# Patient Record
Sex: Male | Born: 1945 | Race: White | Hispanic: No | Marital: Married | Smoking: Former smoker
Health system: Southern US, Community
[De-identification: ages and names within clinical notes are randomized; demographics above are authoritative.]

## PROBLEM LIST (undated history)

## (undated) DIAGNOSIS — E875 Hyperkalemia: Secondary | ICD-10-CM

## (undated) DIAGNOSIS — E119 Type 2 diabetes mellitus without complications: Secondary | ICD-10-CM

## (undated) DIAGNOSIS — I639 Cerebral infarction, unspecified: Secondary | ICD-10-CM

## (undated) DIAGNOSIS — H9192 Unspecified hearing loss, left ear: Secondary | ICD-10-CM

## (undated) DIAGNOSIS — I255 Ischemic cardiomyopathy: Secondary | ICD-10-CM

## (undated) DIAGNOSIS — I2510 Atherosclerotic heart disease of native coronary artery without angina pectoris: Secondary | ICD-10-CM

## (undated) DIAGNOSIS — D649 Anemia, unspecified: Secondary | ICD-10-CM

## (undated) DIAGNOSIS — E785 Hyperlipidemia, unspecified: Secondary | ICD-10-CM

## (undated) DIAGNOSIS — I5022 Chronic systolic (congestive) heart failure: Secondary | ICD-10-CM

## (undated) HISTORY — DX: Hyperkalemia: E87.5

## (undated) HISTORY — PX: CARDIAC CATHETERIZATION: SHX172

## (undated) HISTORY — DX: Anemia, unspecified: D64.9

## (undated) HISTORY — DX: Chronic systolic (congestive) heart failure: I50.22

## (undated) HISTORY — DX: Ischemic cardiomyopathy: I25.5

## (undated) HISTORY — DX: Atherosclerotic heart disease of native coronary artery without angina pectoris: I25.10

---

## 1992-03-31 HISTORY — PX: CORONARY ARTERY BYPASS GRAFT: SHX141

## 1996-03-17 ENCOUNTER — Encounter: Payer: Self-pay | Admitting: Cardiology

## 2005-03-19 ENCOUNTER — Ambulatory Visit: Payer: Self-pay | Admitting: Cardiology

## 2006-09-02 ENCOUNTER — Ambulatory Visit: Payer: Self-pay | Admitting: Cardiology

## 2008-06-28 ENCOUNTER — Ambulatory Visit: Payer: Self-pay | Admitting: Cardiology

## 2008-09-13 LAB — HEMOCCULT GUIAC POC 1CARD (OFFICE)

## 2009-07-02 DIAGNOSIS — E1121 Type 2 diabetes mellitus with diabetic nephropathy: Secondary | ICD-10-CM

## 2009-07-02 DIAGNOSIS — E785 Hyperlipidemia, unspecified: Secondary | ICD-10-CM

## 2009-07-02 DIAGNOSIS — I2510 Atherosclerotic heart disease of native coronary artery without angina pectoris: Secondary | ICD-10-CM | POA: Insufficient documentation

## 2009-07-04 ENCOUNTER — Ambulatory Visit: Payer: Self-pay | Admitting: Cardiology

## 2010-03-31 LAB — HM DIABETES EYE EXAM

## 2010-05-01 NOTE — Assessment & Plan Note (Signed)
Summary: [facility]   Visit Type:  Follow-up Primary Provider:  Dr. Vernon Prey   History of Present Illness: The patient presents for yearly followup. Since I last saw him he has had no new cardiovascular complaints. 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 With this he denies any chest pressure, neck or arm discomfort. He has had no shortness of breath, PND or orthopnea. He denies any palpitations, presyncope or syncope. Does note his LDL was slightly elevated 85 when checked in December with an HDL of 43. Hemoglobin A1c was 7.9.  Current Medications (verified): 1)  Pravastatin Sodium 80 Mg Tabs (Pravastatin Sodium) .Marland Kitchen.. 1 By Mouth Daily 2)  Zetia 10 Mg Tabs (Ezetimibe) .Marland Kitchen.. 1 By Mouth Daily 3)  Lisinopril 20 Mg Tabs (Lisinopril) .Marland Kitchen.. 1 By Mouth Daily 4)  Aspirin 325 Mg  Tabs (Aspirin) .Marland Kitchen.. 1 By Mouth Daily 5)  Glucophage 500 Mg Tabs (Metformin Hcl) .Marland Kitchen.. 1 Qam and 3 Qpm  Allergies (verified): 1)  ! Tetanus-Diphtheria Toxoids Td (Tetanus-Diphtheria Toxoids Td)  Past History:  Past Medical History: Last updated: 07/02/2009  1. Coronary artery disease (catheterization in 1994, 99% LAD stenosis,       70-80% first obtuse marginal to second obtuse marginal stenosis,       80% right coronary artery stenosis).   2. Coronary artery bypass graft (LIMA to the LAD, sequential SVG to       first and second obtuse marginal, sequential vein graft to the       right coronary artery).   3. Ischemic cardiomyopathy (EF of about 25%).   4. Diabetes mellitus since 1980s.   5. Dyslipidemia.   Past Surgical History: Reviewed history from 07/02/2009 and no changes required.  Coronary artery bypass graft  Review of Systems       As stated in the HPI and negative for all other systems.   Vital Signs:  Patient profile:   65 year old male Height:      71 inches Weight:      178 pounds BMI:     24.92 Pulse rate:   62 / minute Resp:     16 per minute BP sitting:    110 / 62  (right arm)  Vitals Entered By: Marrion Coy, CNA (July 04, 2009 4:02 PM)  Physical Exam  General:  Well developed, well nourished, in no acute distress. Head:  normocephalic and atraumatic Eyes:  PERRLA/EOM intact; conjunctiva and lids normal. Mouth:  Teeth, gums and palate normal. Oral mucosa normal. Neck:  Neck supple, no JVD. No masses, thyromegaly or abnormal cervical nodes. Chest Wall:  well-healed sternotomy scar Lungs:  Clear bilaterally to auscultation and percussion. Abdomen:  Bowel sounds positive; abdomen soft and non-tender without masses, organomegaly, or hernias noted. No hepatosplenomegaly. Msk:  Back normal, normal gait. Muscle strength and tone normal. Extremities:  No clubbing or cyanosis. Neurologic:  Alert and oriented x 3. Skin:  Intact without lesions or rashes. Cervical Nodes:  no significant adenopathy Inguinal Nodes:  no significant adenopathy Psych:  Normal affect.   Detailed Cardiovascular Exam  Neck    Carotids: Carotids full and equal bilaterally without bruits.      Neck Veins: Normal, no JVD.    Heart    Inspection: no deformities or lifts noted.      Palpation: normal PMI with no thrills palpable.      Auscultation: regular rate and rhythm, S1, S2 without murmurs, rubs, gallops, or clicks.  Vascular    Abdominal Aorta: no palpable masses, pulsations, or audible bruits.      Femoral Pulses: normal femoral pulses bilaterally.      Pedal Pulses: normal pedal pulses bilaterally.      Radial Pulses: normal radial pulses bilaterally.      Peripheral Circulation: no clubbing, cyanosis, or edema noted with normal capillary refill.     EKG  Procedure date:  07/04/2009  Findings:      inus rhythm, rate 62, premature atrial contraction, intervals within normal limits, old anteroseptal infarct, deep lateral T-wave inversions, all unchanged from previous  Impression & Recommendations:  Problem # 1:  CAD (ICD-414.00) He has no new  symptoms and is participating in risk reduction. I would like to do a stress perfusion study as his bypass grafts are now 65 years old. However, he steadfastly refuses until he can get Medicare.  Problem # 2:  CARDIOMYOPATHY (ICD-425.4) The patient has allowed titration of a few medications. However, he's been very reluctant to have further to titration and refuses an echocardiogram again until Medicare kicks in. Fortunately he remains asymptomatic.  Problem # 3:  DYSLIPIDEMIA (ICD-272.4) He is not quite at target with an LDL of 85 though his HDL is reasonable. This is followed by his primary physician. The patient prefers not to change medications and can continue to try with diet to reach an LDL in the 70s.

## 2010-07-17 ENCOUNTER — Encounter: Payer: Self-pay | Admitting: Family Medicine

## 2010-07-17 DIAGNOSIS — D649 Anemia, unspecified: Secondary | ICD-10-CM

## 2010-07-17 DIAGNOSIS — I2510 Atherosclerotic heart disease of native coronary artery without angina pectoris: Secondary | ICD-10-CM

## 2010-07-17 DIAGNOSIS — I509 Heart failure, unspecified: Secondary | ICD-10-CM

## 2010-08-13 NOTE — Assessment & Plan Note (Signed)
Luke Pittman                            CARDIOLOGY OFFICE NOTE   Luke Pittman, Luke Pittman                        MRN:          [id number]  DATE:06/28/2008                            DOB:          [birth year]    PRIMARY CARE PHYSICIAN:  Ernestina Penna, MD   REASON FOR PRESENTATION:  Evaluate the patient with ischemic  cardiomyopathy.   HISTORY OF PRESENT ILLNESS:  The patient returns after about an 18 month  absence.  He has a history of an ischemic cardiomyopathy.  However, he  has never consented to do evaluations that I had suggested.  He promised  me last time I saw him that he would get an echocardiogram, but he never  complied.  He thought it was going to cost about 1000 dollars with his  copay.  He refuses these kind of therapies.  He refuses an ICD.  He does  take medications as listed.  He gets his labs checked.  He works 2 jobs.  He works vigorously.  With this, he denies any chest discomfort, neck,  or arm discomfort.  He has no palpitations, presyncope, or syncope.  He  denies any PND or orthopnea.   PAST MEDICAL HISTORY:  1. Coronary artery disease (catheterization in 1994, 99% LAD stenosis,      70-80% first obtuse marginal to second obtuse marginal stenosis,      80% right coronary artery stenosis).  2. Coronary artery bypass graft (LIMA to the LAD, sequential SVG to      first and second obtuse marginal, sequential vein graft to the      right coronary artery).  3. Ischemic cardiomyopathy (EF of about 25%).  4. Diabetes mellitus since 1980s.  5. Dyslipidemia.   ALLERGIES:  TETANUS (the patient has also been intolerant of COREG and  TOPROL, but he cannot recall specifically the reactions).   MEDICATIONS:  1. Pravastatin 80 mg at bedtime.  2. Prinivil 10 mg daily.  3. Aspirin.  4. Glucophage 2000 mg b.i.d.  5. Zetia 10 mg daily.   REVIEW OF SYSTEMS:  As stated in the HPI and otherwise negative for  other systems.   PHYSICAL  EXAMINATION:  GENERAL:  The patient is in no distress.  VITAL SIGNS:  Blood pressure 128/62, heart rate 59 and regular.  HEENT:  Eyelids unremarkable; pupils are equal, round, and reactive to  light; fundi not visualized; oral mucosa unremarkable.  NECK:  No jugular venous distention at 45 degrees, carotid upstroke  brisk and symmetric, no bruits, no thyromegaly.  LYMPHATICS:  No adenopathy.  LUNGS:  Clear to auscultation bilaterally.  BACK:  No costovertebral angle tenderness.  CHEST:  Unremarkable.  HEART:  PMI not displaced or sustained; S1 and S2 within normal limits,  no S3, no S4; no clicks, no rubs, no murmurs.  ABDOMEN:  Flat; positive bowel sounds, normal in frequency and pitch; no  bruits, no rebound, no guarding, no midline pulsatile mass; no  hepatomegaly, no splenomegaly.  SKIN:  No rashes, no nodules.  EXTREMITIES:  Pulses 2+, no edema.   EKG, sinus  bradycardia, rate 59, premature atrial contractions, old  anteroseptal infarct, diffuse T-wave inversions unchanged from previous.   ASSESSMENT AND PLAN:  1. Ischemic cardiomyopathy.  The patient remarkably has class I      symptoms.  He will not allow uptitration of his medications.  He      refuses an echocardiogram.  He refuses a defibrillator.  He does      take the medications as listed.  He will let me know if he has any      increasing symptoms.  He will remain on the current regimen.  2. Coronary artery disease.  He does not want a stress test.  He does      comply with risk reduction and I reviewed his lipids and they are      acceptable as he has an excellent HDL.  His LDL was in the 80s.  He      will continue on the meds as listed.  3. Dyslipidemia as above.  4. Diabetes.  His blood sugar is not well controlled and he promised      he will work on this.  5. Tobacco.  He does not smoke.  6. Followup.  I will see him back in 1 year or sooner if he has any      symptoms.     Rollene Rotunda, MD, [facility]   Electronically Signed    JH/MedQ  DD: 06/28/2008  DT: 06/29/2008  Job #: [id number]   cc:   Ernestina Penna, M.D.

## 2010-08-13 NOTE — Assessment & Plan Note (Signed)
[facility]                            CARDIOLOGY OFFICE NOTE   YUMA, PACELLA                        MRN:          [id number]  DATE:09/02/2006                            DOB:          04/25/1945    REASON FOR PRESENTATION:  Ischemic cardiomyopathy.   HISTORY OF PRESENT ILLNESS:  The patient returns for 69-month followup.  He has surprisingly done well over the years.  He is now 65 years old.  He has had a few episodes of light-headedness and says that occasionally  his blood pressure will be in the 90s when he has this.  He has some  blurred vision.  He has not had any presyncope or syncope.  He denies  any dyspnea and has no PND or orthopnea.  He has no chest discomfort,  neck or arm discomfort.  He does not notice any palpitations.  He  has  never had any presyncope or syncope (class 1 symptoms).   PAST MEDICAL HISTORY:  1. Coronary artery disease (catheterization 1994, 99% LAD stenosis, 70-      80% first obtuse margin to second obtuse marginal, 80% right      coronary artery stenosis).  2. Coronary artery bypass graft (LIMA to the LAD, sequential SBG to      first and second obtuse marginal, sequential vein graft to the      right coronary artery).  3. Ischemic cardiomyopathy (EF less than 25%).  4. Diabetes mellitus since the 1980s.  5. Dyslipidemia.   ALLERGIES:  TETANUS (THE PATIENT IS ALSO INTOLERANT OF COREG AND TOPROL,  BUT HE CANNOT RECALL SPECIFICALLY THE REACTIONS).   MEDICATIONS:  1. Pravastatin 80 mg nightly.  2. Prinivil 10 mg daily.  3. Aspirin 325 mg daily.  4. Glucophage 2000 mg b.i.d.  5. Januvia 100 mg daily.   REVIEW OF SYSTEMS:  As stated in the HPI and otherwise negative for all  other systems.   PHYSICAL EXAMINATION:  GENERAL:  The patient is in no distress.  VITAL SIGNS: Blood pressure 104/58.  Heart rate 67 and irregular.  HEENT:  Eyes unremarkable.  Pupils equal round and reactive to light.  Fundi not  visualized.  Oral mucosa unremarkable.  NECK:  No jugular venous distention, 45 degrees.  Carotid upstrokes  brisk and symmetrical.  No bruits.  No thyromegaly.  LYMPHATICS:  No cervical, axillary, or inguinal adenopathy.  LUNGS:  Clear to auscultation bilaterally.  BACK:  No costovertebral angle tenderness.  CHEST:  Unremarkable.  HEART:  PMI not displaced or sustained.  S1 and S2 within normal limits.  No S3, no S4.  No clicks, rubs, or murmurs.  ABDOMEN:  Flat.  Positive bowel sounds, normal in frequency and pitch.  No bruits, guarding, rebound, no midline pulse.  No hepatosplenomegaly.  SKIN:  No rashes.  No nodules.  EXTREMITIES:  Pulses 2+ throughout.  No edema.  No cyanosis.  No  clubbing.  NEURO:  Oriented to person, place, and time.  Cranial nerves II through  XII grossly intact, motor grossly intact.   EKG:  Sinus rhythm.  Rate 67, axis within normal limits.  Intervals  within normal limits.  Inferolateral T wave inversions, old anterior  myocardial infarction, no change from previous EKGs.   ASSESSMENT AND PLAN:  1. Ischemic cardiomyopathy.  The patient has refused defibrillator.      He does not want to take a beta-blocker.  He actually does not have      much blood pressure for med titration.  He is in class 1 symptoms.      He refuses stress perfusion study.  He does consent to an      echocardiogram so I can assess his left ventricular function.      However, he wants to wait until the fall to have this done.  He      promises he will call.  He also promises that he will let me know      if he ever has any increasing dyspnea, chest discomfort, or in      particular presyncope or syncope.  Until then he will consent to      continuing the medications as listed.  We have discussed the risk      of sudden death and other adverse outcomes with this relatively      conservative approach and he understands.  2. Followup.  Hopefully I will see him after his echocardiogram  but we      will put him down for 103-month followup.     Rollene Rotunda, MD, [facility]  Electronically Signed    JH/MedQ  DD: 09/02/2006  DT: 09/02/2006  Job #: [id number]   cc:   Ernestina Penna, M.D.

## 2010-08-19 ENCOUNTER — Encounter: Payer: Self-pay | Admitting: Cardiology

## 2011-05-28 ENCOUNTER — Ambulatory Visit (INDEPENDENT_AMBULATORY_CARE_PROVIDER_SITE_OTHER): Payer: Medicare Other | Admitting: Cardiology

## 2011-05-28 ENCOUNTER — Encounter: Payer: Self-pay | Admitting: Cardiology

## 2011-05-28 VITALS — BP 132/61 | HR 74 | Ht 71.0 in | Wt 188.0 lb

## 2011-05-28 DIAGNOSIS — E119 Type 2 diabetes mellitus without complications: Secondary | ICD-10-CM

## 2011-05-28 DIAGNOSIS — I428 Other cardiomyopathies: Secondary | ICD-10-CM

## 2011-05-28 DIAGNOSIS — I2510 Atherosclerotic heart disease of native coronary artery without angina pectoris: Secondary | ICD-10-CM

## 2011-05-28 DIAGNOSIS — E785 Hyperlipidemia, unspecified: Secondary | ICD-10-CM

## 2011-05-28 NOTE — Assessment & Plan Note (Signed)
He seems to be euvolemic. Again I would like to reassess his ejection fraction as above and we will see if he will agree to this this year.

## 2011-05-28 NOTE — Patient Instructions (Signed)
Your physician has requested that you have a lexiscan myoview. For further information please visit [email or web address] Please follow instruction sheet, as given.  The current medical regimen is effective;  continue present plan and medications.  Please call to schedule your appointment for your stress test [phone] after speaking with your insurance company.

## 2011-05-28 NOTE — Assessment & Plan Note (Signed)
The patient has 66 year old bypass grafts. Over the years I have tried to get into comply with stress testing and an echocardiogram. However, he has refused to do this because of cost. I have again suggested a [employer].  He will consider this. For now he will continue meds as listed with the change mention below.

## 2011-05-28 NOTE — Assessment & Plan Note (Signed)
His last hemoglobin A1c was 7.7.  This is followed by Dr. Christell Constant and I will defer his management.

## 2011-05-28 NOTE — Progress Notes (Signed)
   HPI The patient presents for follow up of CAD and ischemic cardiomyopathy.  Since I last saw him he has no new cardiovascular complaints.  He has retired so that he is not as active as he was. With his activities of daily living he denies any new cardiovascular symptoms.  The patient denies any new symptoms such as chest discomfort, neck or arm discomfort. There has been no new shortness of breath, PND or orthopnea. There have been no reported palpitations, presyncope or syncope.    Allergies  Allergen Reactions  . Fish Oil Diarrhea  . Januvia (Sitagliptin Phosphate) Other (See Comments)    Leg cramps  . Tetanus-Diphtheria Toxoids     Current Outpatient Prescriptions  Medication Sig Dispense Refill  . aspirin 325 MG EC tablet Take 325 mg by mouth daily.        Marland Kitchen ezetimibe (ZETIA) 10 MG tablet Take 10 mg by mouth daily.        Marland Kitchen glimepiride (AMARYL) 1 MG tablet Take 1 mg by mouth 2 (two) times daily.        Marland Kitchen lisinopril (PRINIVIL,ZESTRIL) 20 MG tablet Take 20 mg by mouth daily.        . metFORMIN (GLUCOPHAGE) 500 MG tablet Take 500 mg by mouth. 1 tab qam and 3 tabs qpm       . pravastatin (PRAVACHOL) 40 MG tablet Take 40 mg by mouth daily. 2 tabs qd       . Vitamin D, Ergocalciferol, (DRISDOL) 50000 UNITS CAPS Take 50,000 Units by mouth.        Past Medical History  Diagnosis Date  . CAD (coronary artery disease)     1994 99% LAD stenosis, 70-80% OM stenosis, 80% right coronary artery stenosis.  . CHF (congestive heart failure)     25%  . Anemia   . NIDDM (non-insulin dependent diabetes mellitus)     Past Surgical History  Procedure Date  . Coronary artery bypass graft     1994. LIMA to the LAD, sequential SVG to first and second obtuse marginal, sequential SVG to the right coronary artery.    ROS: As stated in the HPI and negative for all other systems.  PHYSICAL EXAM BP 132/61  Pulse 74  Ht 5\' 11"  (1.803 m)  Wt 188 lb (85.276 kg)  BMI 26.22 kg/m2 GENERAL:  Well  appearing HEENT:  Pupils equal round and reactive, fundi not visualized, oral mucosa unremarkable NECK:  No jugular venous distention, waveform within normal limits, carotid upstroke brisk and symmetric, no bruits, no thyromegaly LYMPHATICS:  No cervical, inguinal adenopathy LUNGS:  Clear to auscultation bilaterally BACK:  No CVA tenderness CHEST:  Well healed sternotomy scar. HEART:  PMI not displaced or sustained,S1 and S2 within normal limits, no S3, no S4, no clicks, no rubs, no murmurs ABD:  Flat, positive bowel sounds normal in frequency in pitch, no bruits, no rebound, no guarding, no midline pulsatile mass, no hepatomegaly, no splenomegaly EXT:  2 plus pulses throughout, no edema, no cyanosis no clubbing SKIN:  No rashes no nodules NEURO:  Cranial nerves II through XII grossly intact, motor grossly intact throughout PSYCH:  Cognitively intact, oriented to person place and time  EKG:   Normal sinus rhythm, rate 74, right axis deviation, premature ectopic complexes, poor anterior R wave progression, inferolateral T wave inversions all unchanged from previous.  05/28/2011  ASSESSMENT AND PLAN

## 2011-05-28 NOTE — Assessment & Plan Note (Signed)
His last LDL was 90.4. Would like this to be around 70 or lower. I will double his pravastatin and this can be followed up with repeat labs in about 8-10 weeks.

## 2011-05-29 ENCOUNTER — Encounter: Payer: Self-pay | Admitting: Cardiology

## 2011-06-19 ENCOUNTER — Encounter: Payer: Self-pay | Admitting: Cardiology

## 2011-08-04 ENCOUNTER — Emergency Department (HOSPITAL_COMMUNITY): Payer: Medicare Other

## 2011-08-04 ENCOUNTER — Inpatient Hospital Stay (HOSPITAL_COMMUNITY)
Admission: EM | Admit: 2011-08-04 | Discharge: 2011-08-06 | DRG: 287 | Disposition: A | Discharge status: Home / Self Care | Payer: Medicare Other | Source: Ambulatory Visit | Attending: Cardiology | Admitting: Cardiology

## 2011-08-04 ENCOUNTER — Encounter (HOSPITAL_COMMUNITY): Payer: Self-pay | Admitting: Nurse Practitioner

## 2011-08-04 DIAGNOSIS — I5023 Acute on chronic systolic (congestive) heart failure: Principal | ICD-10-CM | POA: Diagnosis present

## 2011-08-04 DIAGNOSIS — I2589 Other forms of chronic ischemic heart disease: Secondary | ICD-10-CM | POA: Diagnosis present

## 2011-08-04 DIAGNOSIS — J90 Pleural effusion, not elsewhere classified: Secondary | ICD-10-CM | POA: Diagnosis present

## 2011-08-04 DIAGNOSIS — Z951 Presence of aortocoronary bypass graft: Secondary | ICD-10-CM

## 2011-08-04 DIAGNOSIS — I252 Old myocardial infarction: Secondary | ICD-10-CM

## 2011-08-04 DIAGNOSIS — E785 Hyperlipidemia, unspecified: Secondary | ICD-10-CM | POA: Diagnosis present

## 2011-08-04 DIAGNOSIS — E119 Type 2 diabetes mellitus without complications: Secondary | ICD-10-CM | POA: Diagnosis present

## 2011-08-04 DIAGNOSIS — I059 Rheumatic mitral valve disease, unspecified: Secondary | ICD-10-CM | POA: Diagnosis present

## 2011-08-04 DIAGNOSIS — Z79899 Other long term (current) drug therapy: Secondary | ICD-10-CM

## 2011-08-04 DIAGNOSIS — I517 Cardiomegaly: Secondary | ICD-10-CM | POA: Diagnosis present

## 2011-08-04 DIAGNOSIS — I509 Heart failure, unspecified: Secondary | ICD-10-CM | POA: Diagnosis present

## 2011-08-04 DIAGNOSIS — Z888 Allergy status to other drugs, medicaments and biological substances status: Secondary | ICD-10-CM

## 2011-08-04 DIAGNOSIS — I5043 Acute on chronic combined systolic (congestive) and diastolic (congestive) heart failure: Secondary | ICD-10-CM

## 2011-08-04 DIAGNOSIS — E1121 Type 2 diabetes mellitus with diabetic nephropathy: Secondary | ICD-10-CM | POA: Diagnosis present

## 2011-08-04 DIAGNOSIS — D649 Anemia, unspecified: Secondary | ICD-10-CM | POA: Diagnosis present

## 2011-08-04 DIAGNOSIS — I2510 Atherosclerotic heart disease of native coronary artery without angina pectoris: Secondary | ICD-10-CM | POA: Diagnosis present

## 2011-08-04 DIAGNOSIS — Z7982 Long term (current) use of aspirin: Secondary | ICD-10-CM

## 2011-08-04 HISTORY — DX: Hyperlipidemia, unspecified: E78.5

## 2011-08-04 LAB — COMPREHENSIVE METABOLIC PANEL
ALT: 37 U/L (ref 0–53)
AST: 24 U/L (ref 0–37)
Albumin: 3.6 g/dL (ref 3.5–5.2)
Alkaline Phosphatase: 53 U/L (ref 39–117)
Calcium: 9.3 mg/dL (ref 8.4–10.5)
GFR calc Af Amer: 85 mL/min — ABNORMAL LOW (ref >90)
Glucose, Bld: 154 mg/dL — ABNORMAL HIGH (ref 70–99)
Potassium: 4.9 mEq/L (ref 3.5–5.1)
Sodium: 140 mEq/L (ref 135–145)
Total Protein: 6.5 g/dL (ref 6.0–8.3)

## 2011-08-04 LAB — URINALYSIS, ROUTINE W REFLEX MICROSCOPIC
Bilirubin Urine: NEGATIVE
Glucose, UA: 100 mg/dL — AB
Hgb urine dipstick: NEGATIVE
Ketones, ur: NEGATIVE mg/dL
Nitrite: NEGATIVE
Specific Gravity, Urine: 1.012 (ref 1.005–1.030)
pH: 7.5 (ref 5.0–8.0)

## 2011-08-04 LAB — CBC
MCH: 25.9 pg — ABNORMAL LOW (ref 26.0–34.0)
MCH: 26.6 pg (ref 26.0–34.0)
MCHC: 33 g/dL (ref 30.0–36.0)
MCV: 81.4 fL (ref 78.0–100.0)
MCV: 80.6 fL (ref 78.0–100.0)
Platelets: 203 10*3/uL (ref 150–400)
Platelets: 215 10*3/uL (ref 150–400)
RBC: 4.13 MIL/uL — ABNORMAL LOW (ref 4.22–5.81)
RDW: 15.7 % — ABNORMAL HIGH (ref 11.5–15.5)
RDW: 15.6 % — ABNORMAL HIGH (ref 11.5–15.5)

## 2011-08-04 LAB — POCT I-STAT TROPONIN I: Troponin i, poc: 0.01 ng/mL (ref 0.00–0.08)

## 2011-08-04 LAB — CARDIAC PANEL(CRET KIN+CKTOT+MB+TROPI)
Relative Index: INVALID (ref 0.0–2.5)
Troponin I: <0.3 ng/mL (ref <0.30)

## 2011-08-04 LAB — DIFFERENTIAL
Basophils Absolute: 0 10*3/uL (ref 0.0–0.1)
Basophils Relative: 0 % (ref 0–1)
Eosinophils Absolute: 0.3 10*3/uL (ref 0.0–0.7)
Eosinophils Relative: 4 % (ref 0–5)
Lymphs Abs: 1.9 10*3/uL (ref 0.7–4.0)
Neutrophils Relative %: 61 % (ref 43–77)

## 2011-08-04 MED ORDER — LISINOPRIL 20 MG PO TABS
20.0000 mg | ORAL_TABLET | Freq: Every day | ORAL | Status: DC
  Administered 2011-08-05 – 2011-08-06 (×2): 20 mg via ORAL
  Filled 2011-08-04 (×2): qty 1

## 2011-08-04 MED ORDER — ALPRAZOLAM 0.25 MG PO TABS
0.2500 mg | ORAL_TABLET | Freq: Two times a day (BID) | ORAL | Status: DC | PRN
  Administered 2011-08-05: 0.25 mg via ORAL
  Filled 2011-08-04: qty 1

## 2011-08-04 MED ORDER — SODIUM CHLORIDE 0.9 % IJ SOLN: 3.0000 mL | INTRAMUSCULAR | Status: DC | PRN

## 2011-08-04 MED ORDER — GLIMEPIRIDE 1 MG PO TABS
1.0000 mg | ORAL_TABLET | Freq: Two times a day (BID) | ORAL | Status: DC
  Administered 2011-08-04 – 2011-08-06 (×3): 1 mg via ORAL
  Filled 2011-08-04 (×6): qty 1

## 2011-08-04 MED ORDER — ZOLPIDEM TARTRATE 5 MG PO TABS: 5.0000 mg | ORAL_TABLET | Freq: Every evening | ORAL | Status: DC | PRN

## 2011-08-04 MED ORDER — ONDANSETRON HCL 4 MG/2ML IJ SOLN: 4.0000 mg | Freq: Four times a day (QID) | INTRAMUSCULAR | Status: DC | PRN

## 2011-08-04 MED ORDER — POTASSIUM CHLORIDE CRYS ER 20 MEQ PO TBCR
20.0000 meq | EXTENDED_RELEASE_TABLET | Freq: Three times a day (TID) | ORAL | Status: DC
  Administered 2011-08-04 – 2011-08-06 (×6): 20 meq via ORAL
  Filled 2011-08-04 (×8): qty 1

## 2011-08-04 MED ORDER — DIAZEPAM 5 MG PO TABS
5.0000 mg | ORAL_TABLET | ORAL | Status: AC
  Administered 2011-08-05: 5 mg via ORAL
  Filled 2011-08-04: qty 1

## 2011-08-04 MED ORDER — ENOXAPARIN SODIUM 40 MG/0.4ML ~~LOC~~ SOLN
40.0000 mg | SUBCUTANEOUS | Status: DC
  Administered 2011-08-04: 40 mg via SUBCUTANEOUS
  Filled 2011-08-04 (×2): qty 0.4

## 2011-08-04 MED ORDER — FUROSEMIDE 10 MG/ML IJ SOLN
40.0000 mg | Freq: Three times a day (TID) | INTRAMUSCULAR | Status: DC
  Administered 2011-08-04: 40 mg via INTRAVENOUS
  Filled 2011-08-04 (×5): qty 4

## 2011-08-04 MED ORDER — SODIUM CHLORIDE 0.9 % IV SOLN: 250.0000 mL | INTRAVENOUS | Status: DC | PRN

## 2011-08-04 MED ORDER — ASPIRIN EC 325 MG PO TBEC
325.0000 mg | DELAYED_RELEASE_TABLET | Freq: Every day | ORAL | Status: DC
  Administered 2011-08-06: 325 mg via ORAL
  Filled 2011-08-04 (×2): qty 1

## 2011-08-04 MED ORDER — ACETAMINOPHEN 325 MG PO TABS: 650.0000 mg | ORAL_TABLET | ORAL | Status: DC | PRN

## 2011-08-04 MED ORDER — SODIUM CHLORIDE 0.9 % IV SOLN
INTRAVENOUS | Status: DC
  Administered 2011-08-05: 07:00:00 via INTRAVENOUS

## 2011-08-04 MED ORDER — SODIUM CHLORIDE 0.9 % IJ SOLN
3.0000 mL | Freq: Two times a day (BID) | INTRAMUSCULAR | Status: DC
  Administered 2011-08-04: 3 mL via INTRAVENOUS

## 2011-08-04 MED ORDER — FUROSEMIDE 10 MG/ML IJ SOLN
80.0000 mg | Freq: Once | INTRAMUSCULAR | Status: AC
  Administered 2011-08-04: 80 mg via INTRAVENOUS
  Filled 2011-08-04: qty 8

## 2011-08-04 MED ORDER — SIMVASTATIN 20 MG PO TABS
20.0000 mg | ORAL_TABLET | Freq: Every day | ORAL | Status: DC
  Administered 2011-08-04 – 2011-08-05 (×2): 20 mg via ORAL
  Filled 2011-08-04 (×3): qty 1

## 2011-08-04 MED ORDER — SODIUM CHLORIDE 0.9 % IJ SOLN: 3.0000 mL | Freq: Two times a day (BID) | INTRAMUSCULAR | Status: DC

## 2011-08-04 MED ORDER — EZETIMIBE 10 MG PO TABS
10.0000 mg | ORAL_TABLET | Freq: Every day | ORAL | Status: DC
  Administered 2011-08-04 – 2011-08-06 (×3): 10 mg via ORAL
  Filled 2011-08-04 (×3): qty 1

## 2011-08-04 NOTE — ED Notes (Signed)
Had been feeling bad for a week and sob on exertion states went to [facility] and found to hane chf pt has hx of same

## 2011-08-04 NOTE — ED Notes (Signed)
Diet tray ordered. No voiced complaints presently. NAD. Informed patient and/or family of status. Awaiting bed assignment.

## 2011-08-04 NOTE — ED Notes (Signed)
Report received, assumed care.  

## 2011-08-04 NOTE — ED Notes (Signed)
Pt resting quietly, denies any pain or shortness of breath at this time. Plan of care is updated with verbal understanding. Will continue to monitor pt.

## 2011-08-04 NOTE — ED Provider Notes (Addendum)
This chart was scribed for Luke Sprout, MD by Williemae Natter. The patient was seen in room STRE6/STRE6 at 2:14 PM  History     CSN: [id number]  Arrival date & time 08/04/11  1321   First MD Initiated Contact with Patient 08/04/11 1408      Chief Complaint  Patient presents with  . Shortness of Breath    (Consider location/radiation/quality/duration/timing/severity/associated sxs/prior treatment) HPI Luke Pittman is a 66 y.o. male with a hx of coronary bypass graft who presents to the Emergency Department complaining of shortness of breath. Pt has a dry cough but no chest pain or swelling in legs. Shortness of breath has worsened in the past week. Pt saw PCP today and had an X-ray done. Pt diagnosed with fluid in lungs and was referred to ED. Pt has trouble walking more than 100 ft. No orthopnea or pmd.  PCP- Dr. Rudi Heap Past Medical History  Diagnosis Date  . CAD (coronary artery disease)     1994 99% LAD stenosis, 70-80% OM stenosis, 80% right coronary artery stenosis.  . CHF (congestive heart failure)     25%  . Anemia   . NIDDM (non-insulin dependent diabetes mellitus)     Past Surgical History  Procedure Date  . Coronary artery bypass graft     1994. LIMA to the LAD, sequential SVG to first and second obtuse marginal, sequential SVG to the right coronary artery.    History reviewed. No pertinent family history.  History  Substance Use Topics  . Smoking status: Former Games developer  . Smokeless tobacco: Not on file   Comment: quit 40 years ago  . Alcohol Use: No      Review of Systems  Constitutional: Negative for fever and chills.  Respiratory: Positive for cough and shortness of breath.   Cardiovascular: Negative for chest pain.  Gastrointestinal: Negative for nausea and vomiting.  Neurological: Negative for weakness.  All other systems reviewed and are negative.    Allergies  Beta adrenergic blockers; Fish oil; Januvia; and Tetanus-diphtheria  toxoids td  Home Medications   Current Outpatient Rx  Name Route Sig Dispense Refill  . ASPIRIN 325 MG PO TBEC Oral Take 325 mg by mouth daily.      Marland Kitchen VITAMIN D 1000 UNITS PO TABS Oral Take 1,000 Units by mouth daily.    Marland Kitchen EZETIMIBE 10 MG PO TABS Oral Take 10 mg by mouth daily.      Marland Kitchen GLIMEPIRIDE 1 MG PO TABS Oral Take 1 mg by mouth 2 (two) times daily as needed. If BG is over 120    . LISINOPRIL 20 MG PO TABS Oral Take 20 mg by mouth daily.      Marland Kitchen METFORMIN HCL 500 MG PO TABS Oral Take 500 mg by mouth. 1 tab qam and 3 tabs qpm     . PRAVASTATIN SODIUM 40 MG PO TABS Oral Take 80 mg by mouth every evening. 2 tabs qd      BP 134/72  Pulse 107  Temp(Src) 98.2 F (36.8 C) (Oral)  Resp 16  Ht 5\' 11"  (1.803 m)  Wt 185 lb (83.915 kg)  BMI 25.80 kg/m2  SpO2 97%  Physical Exam  Nursing note and vitals reviewed. Constitutional: He is oriented to person, place, and time. He appears well-developed and well-nourished. No distress.  HENT:  Head: Normocephalic and atraumatic.  Eyes: EOM are normal.  Neck: Neck supple. No tracheal deviation present.  Cardiovascular: Normal rate, regular rhythm and normal heart  sounds.   Pulmonary/Chest: Effort normal. No respiratory distress.       Bottom quarter of lungs have rales bilaterally   Musculoskeletal: Normal range of motion. He exhibits edema (trace edema in bilateral lower extremities).  Neurological: He is alert and oriented to person, place, and time.  Skin: Skin is warm and dry.  Psychiatric: He has a normal mood and affect. His behavior is normal.    ED Course  Procedures (including critical care time)  Labs Reviewed  CBC - Abnormal; Notable for the following:    RBC 4.13 (*)    Hemoglobin 10.7 (*)    HCT 33.6 (*)    MCH 25.9 (*)    RDW 15.7 (*)    All other components within normal limits  COMPREHENSIVE METABOLIC PANEL - Abnormal; Notable for the following:    Glucose, Bld 154 (*)    GFR calc non Af Amer 73 (*)    GFR calc Af  Amer 85 (*)    All other components within normal limits  PRO B NATRIURETIC PEPTIDE - Abnormal; Notable for the following:    Pro B Natriuretic peptide (BNP) 1276.0 (*)    All other components within normal limits  DIFFERENTIAL  POCT I-STAT TROPONIN I  URINALYSIS, ROUTINE W REFLEX MICROSCOPIC   Dg Chest 2 View  08/04/2011  *RADIOLOGY REPORT*  Clinical Data: Shortness of breath, status post CABG  CHEST - 2 VIEW  Comparison: None  Findings: Borderline cardiomegaly noted.  The patient is status post CABG.  There is small left pleural effusion with left basilar atelectasis or infiltrate.  No pulmonary edema.  IMPRESSION: Cardiomegaly.  Small left pleural effusion with left basilar atelectasis or infiltrate.  Original Report Authenticated By: Natasha Mead, M.D.    Date: 08/04/2011  Rate: 93  Rhythm: normal sinus rhythm  QRS Axis: normal  Intervals: normal  ST/T Wave abnormalities: nonspecific T wave changes, t-wave inversion anteriorly  Conduction Disutrbances:nonspecific intraventricular conduction delay, LVH  Narrative Interpretation:   Old EKG Reviewed: none available    1. Acute on chronic combined systolic and diastolic congestive heart failure       MDM   Patient with exertional dyspnea sent from his doctor with symptoms concerning for CHF. Patient is in no acute distress. Eye exam he does have basilar rales. Chest x-ray consistent with mild pleural effusion and labs consistent with CHF with a BNP of 1276. Patient is on a loop diuretic at this time and states that he is not very good about watching his salt intake. Spoke with cardiology and will place patient on a heart failure protocol. He was given one dose of IV Lasix and they will come and evaluate him as well.  Patient evaluated by cardiology and they decided to admit him for further care I personally performed the services described in this documentation, which was scribed in my presence.  The recorded information has been  reviewed and considered.         Luke Sprout, MD 08/04/11 1608  Luke Sprout, MD 08/04/11 0865  Luke Sprout, MD 08/04/11 [id number]

## 2011-08-04 NOTE — ED Notes (Signed)
Pt updated on room assignment, pt continues to deny any pain or shortness of breath. Pt INAD, ambulatory in room with 97% on RA and pt will be awaiting transport to inpt bed assignment.

## 2011-08-04 NOTE — H&P (Addendum)
Cardiology Consult Note   Patient ID: Luke Pittman MRN: [id number], DOB/AGE: 66-Feb-1947   Admit date: 08/04/2011 Date of Consult: 08/04/2011  Primary Physician: Rudi Heap, MD, MD Primary Cardiologist: Rollene Rotunda, MD   Pt. Profile: Luke Pittman is a 66yo male with PMHx significant for CAD (CABG in 1994 with 99% LAD stenosis, 70-80% OM stenosis, 80% RCA stenosis), ischemic cardiomyopathy, type 2 DM and HL who presents to [facility] ED today for shortness of breath.   2D echocardiogram- 2005: distal septal/anterior and apical akinesis, remaining walls hypokinetic, severely reduced LV, trace MR/TR  Reason for consult: evaluation/management of acute CHF   Problem List: Past Medical History  Diagnosis Date  . CAD (coronary artery disease)     1994 99% LAD stenosis, 70-80% OM stenosis, 80% right coronary artery stenosis.  . CHF (congestive heart failure)     25%  . Anemia   . NIDDM (non-insulin dependent diabetes mellitus)     Past Surgical History  Procedure Date  . Coronary artery bypass graft     1994. LIMA to the LAD, sequential SVG to first and second obtuse marginal, sequential SVG to the right coronary artery.     Allergies:  Allergies  Allergen Reactions  . Beta Adrenergic Blockers     Swollen hands  . Fish Oil Diarrhea  . Januvia (Sitagliptin Phosphate) Other (See Comments)    Leg cramps  . Tetanus-Diphtheria Toxoids Td Swelling    HPI:   He was last seen in the office by Dr. Antoine Poche in 02/13. He had been doing well at that time, denying new cardiovascular complaints. He was able to performs his ADLs without incident. There was a note that over the years, he has tried to have [name] comply with stress testing and an echocardiogram. He has refused repeatedly because of cost. At that time, he was considering a [employer].  He reports experiencing increased DOE over the past week. He usually performs yardwork and performs heavy lifting about 3 days/week. Over the  last week, he notices just walking across the yard makes him short of breath.  No shortness of breath at rest, orthopnea, PND and LE edema. He reports a chronic nonproductive cough which has increased over the past week. No sputum. He states he reports some mild abdominal swelling last Friday, which resolved on its own. He denies chest pain, palpitations, lightheadedness, diaphoresis, n/v, fevers or chills. Reports medication compliance. He reports baseline increased salt intake. He does endorse some weight gain.   Upon ED arrival, EKG reveals old anterior infarct (Q waves V1-V3), TWIs V5, V6; 1 mm ST elevation V1-V3. POC TnI WNL. pBNP elevated at 1276. CXR reveals cardiomegaly, small L pleural effusion with left basilar atelectasis vs infiltrate,  H/H mildly decreased at 10.7/33.6. CBC and BMET otherwise WNL. Vital signs otherwise stable. He was given Lasix 80mg  IV x 1.   Home Medications: Prior to Admission medications   Medication Sig Start Date End Date Taking? Authorizing Provider  aspirin 325 MG EC tablet Take 325 mg by mouth daily.     Yes Historical Provider, MD  cholecalciferol (VITAMIN D) 1000 UNITS tablet Take 1,000 Units by mouth daily.   Yes Historical Provider, MD  ezetimibe (ZETIA) 10 MG tablet Take 10 mg by mouth daily.     Yes Historical Provider, MD  glimepiride (AMARYL) 1 MG tablet Take 1 mg by mouth 2 (two) times daily as needed. If BG is over 120   Yes Historical Provider, MD  lisinopril (PRINIVIL,ZESTRIL) 20 MG tablet Take 20 mg by mouth daily.     Yes Historical Provider, MD  metFORMIN (GLUCOPHAGE) 500 MG tablet Take 500 mg by mouth. 1 tab qam and 3 tabs qpm    Yes Historical Provider, MD  pravastatin (PRAVACHOL) 40 MG tablet Take 80 mg by mouth every evening. 2 tabs qd   Yes Historical Provider, MD    Inpatient Medications:     . furosemide  80 mg Intravenous Once    (Not in a hospital admission)  Family History  Problem Relation Age of Onset  . Heart failure Mother    . Heart attack Father 45  . Heart attack Brother 86    Deceased at 23 from heart failure     History   Social History  . Marital Status: Married    Spouse Name: N/A    Number of Children: 2  . Years of Education: N/A   Occupational History  . Not on file.   Social History Main Topics  . Smoking status: Former Games developer  . Smokeless tobacco: Not on file   Comment: quit 40 years ago  . Alcohol Use: No  . Drug Use: No  . Sexually Active: Not on file   Other Topics Concern  . Not on file   Social History Narrative   Lives in [place], [place] with wife.      Review of Systems: General: negative for chills, fever, night sweats or weight changes.  Cardiovascular: positive for DOE, negative for chest pain, dema, orthopnea, palpitations, paroxysmal nocturnal dyspnea or shortness of breath Dermatological: negative for rash Respiratory: positive for nonproductive cough, negative for wheezing Urologic: negative for hematuria Abdominal: positive for diarrhea, negative for nausea, vomiting, right red blood per rectum, melena, or hematemesis Neurologic: negative for visual changes, syncope, or dizziness All other systems reviewed and are otherwise negative except as noted above.  Physical Exam: Blood pressure 123/77, pulse 96, temperature 97.6 F (36.4 C), temperature source Oral, resp. rate 26, height 5\' 11"  (1.803 m), weight 83.462 kg (184 lb), SpO2 95.00%.    General: Well developed, well nourished, in no acute distress. Head: Normocephalic, atraumatic, sclera non-icteric, no xanthomas, nares are without discharge.  Neck: Negative for carotid bruits. JVD not elevated. Lungs: Bibasilar rales appreciated. No rhonchi or wheezes. Breathing is unlabored. Heart: RRR with S1 S2. No murmurs, rubs, or gallops appreciated. Abdomen: Soft, non-tender, mildly distended with normoactive bowel sounds. No hepatomegaly. No rebound/guarding. No obvious abdominal masses. Msk:  Strength and tone appears  normal for age. Extremities: 1+ pitting pretibial edema,  no clubbing or cyanosis. Distal pedal pulses are 2+ and equal bilaterally. Neuro:  Alert and oriented X 3. Moves all extremities spontaneously. Psych:   Responds to questions appropriately with a normal affect.  Labs: Recent Labs  Basename 08/04/11 1420   WBC 7.7   HGB 10.7*   HCT 33.6*   MCV 81.4   PLT 203    Lab 08/04/11 1420  NA 140  K 4.9  CL 106  CO2 23  BUN 21  CREATININE 1.04  CALCIUM 9.3  PROT 6.5  BILITOT 0.5  ALKPHOS 53  ALT 37  AST 24  AMYLASE --  LIPASE --  GLUCOSE 154*   Radiology/Studies: Dg Chest 2 View  08/04/2011  *RADIOLOGY REPORT*  Clinical Data: Shortness of breath, status post CABG  CHEST - 2 VIEW  Comparison: None  Findings: Borderline cardiomegaly noted.  The patient is status post CABG.  There is small  left pleural effusion with left basilar atelectasis or infiltrate.  No pulmonary edema.  IMPRESSION: Cardiomegaly.  Small left pleural effusion with left basilar atelectasis or infiltrate.  Original Report Authenticated By: Natasha Mead, M.D.    EKG: NSR, Q waves V1-V3, nonspecific TWIs V4, V5, IVCD, nondiagnostic ST elevation V1-V3 (1mm).   ASSESSMENT AND PLAN:   Mr. Clugston is a 66yo male with PMHx significant for CAD (CABG in 1994 with 99% LAD stenosis, 70-80% OM stenosis, 80% RCA stenosis), ischemic cardiomyopathy, type 2 DM and HL who presents to [facility] ED today for shortness of breath.   1. Acute on chronic combined CHF- the patient has a history of CAD and ischemic cardiomyopathy. The last echo in 2005 revealed distal septal/anterior and apical akinesis, remaining walls hypokinetic, severely reduced LV, trace MR/TR. He reports a 1 week history of increased DOE. He had been well-compensated at home performing yardwork which is strenuous at times prior to this. No chest pain or palpitations. On exam, he does have 1+ pretibial edema and bibasilar rales, but is not overtly volume overloaded. pBNP  elevated, CXR with evidence of small pleural effusion. An exacerbating factor is unclear- compliant with meds, no increased salt intake, no underlying infection, VSS, EKG nonacute and cardiac biomarkers WNL. His grafts are almost 66 years old, and may be eneficial to evaluate their status. In the meantime, would favor diuresis, and reassessment of LVEF by echo. Of note, he has been taking OTC magnesium for restless leg syndrome. Would start BB once diuresed.   - Admit to telemetry  - Cycle cardiac biomarkers  - Lasix 40mg  q8hrs BID + potassium  - Continue ASA, ACEi  - Check Mg  - Daily BMET  - Strict I/Os, daily weights  - Salt restricted diet  - 2D echocardiogram  - NPO tomorrow  - Diagnostic cardiac cath tomorrow   2. Hyperlipidemia- goal LDL < 70  - Continue statin, zetia  - Lipid panel  3. Type 2 DM  - Continue Amaryl  - Monitor CBGs   Signed, R. Hurman Horn, PA-C 08/04/2011, 5:32 PM  Patient seen with PA, agree with note.    66 yo with history of ischemic cardiomyopathy and CABG in 1994 presents with acute systolic CHF.  He says that he has not had a CHF episode in the past.  Symptoms came on fairly quickly about 10 days ago.  He has had progressive exertional dyspnea, now short of breath walking 100 feet.  Prior to 10 days ago, no exercise limitations.  No chest pain (did not have chest pain prior to CABG).  On exam, he is significantly volume overloaded.  1. CHF: Acute systolic CHF.  No prior CHF history.  Has had good exercise tolerance and has worked as a Administrator for years.  Exertional dyspnea x about 10 days, progressive.  He is significantly volume overloaded on exam.  No new medications or dietary changes.  Would be concerned for loss of vein graft leading to CHF exacerbation.  - Start Lasix 40 mg IV q 8 hrs and follow creatinine and K.  - Continue lisinopril 20 mg daily.   - Will plan to add spironolactone but will follow K for now (4.9 today) before starting.  -  Hands swelled with one of the beta blockers many years ago.  He is not sure which.  I would plan on re-challenging with a beta blocker, ideally Coreg, before discharge.  Will pull some volume off before starting the beta blocker, however.  2. CAD: CABG 1994.  No cath or myoview since then.  I am concerned that his current CHF exacerbation that was fairly abrupt in onset may have been due to loss of a vein graft.   - Cycle cardiac enzymes.  - ASA, statin.  Check lipids.  - Would favor cardiac cath to assess grafts.  Will plan for tomorrow if creatinine is stable.  Will keep NPO, MD to reassess in am prior to study.   Marca Ancona 08/04/11

## 2011-08-04 NOTE — ED Notes (Signed)
Dr. Shirlee Latch at bedside, pt assessed, talking with pt & wife.

## 2011-08-04 NOTE — ED Notes (Signed)
Pt reports he has been feeling increasingly SOB over past week and saw PCP [facility] today and was told to come to ED for "fluid on my lungs." pt with mild labored breathing noticed while at rest in triage. No pain

## 2011-08-05 ENCOUNTER — Encounter (HOSPITAL_COMMUNITY)
Admission: EM | Disposition: A | Discharge status: Home / Self Care | Payer: Self-pay | Source: Ambulatory Visit | Attending: Cardiology

## 2011-08-05 ENCOUNTER — Encounter (HOSPITAL_COMMUNITY): Payer: Self-pay | Admitting: *Deleted

## 2011-08-05 DIAGNOSIS — I2510 Atherosclerotic heart disease of native coronary artery without angina pectoris: Secondary | ICD-10-CM

## 2011-08-05 DIAGNOSIS — I059 Rheumatic mitral valve disease, unspecified: Secondary | ICD-10-CM

## 2011-08-05 HISTORY — PX: LEFT HEART CATHETERIZATION WITH CORONARY/GRAFT ANGIOGRAM: SHX5450

## 2011-08-05 LAB — CREATININE, SERUM: GFR calc Af Amer: >90 mL/min (ref >90)

## 2011-08-05 LAB — CBC
HCT: 37.2 % — ABNORMAL LOW (ref 39.0–52.0)
HCT: 36.8 % — ABNORMAL LOW (ref 39.0–52.0)
Hemoglobin: 12.3 g/dL — ABNORMAL LOW (ref 13.0–17.0)
MCH: 26.6 pg (ref 26.0–34.0)
MCH: 27 pg (ref 26.0–34.0)
MCHC: 33.1 g/dL (ref 30.0–36.0)
MCV: 80.5 fL (ref 78.0–100.0)
MCV: 80 fL (ref 78.0–100.0)
RBC: 4.6 MIL/uL (ref 4.22–5.81)
WBC: 6.9 10*3/uL (ref 4.0–10.5)

## 2011-08-05 LAB — BASIC METABOLIC PANEL
BUN: 24 mg/dL — ABNORMAL HIGH (ref 6–23)
Chloride: 98 mEq/L (ref 96–112)
Creatinine, Ser: 1.13 mg/dL (ref 0.50–1.35)
Glucose, Bld: 273 mg/dL — ABNORMAL HIGH (ref 70–99)
Potassium: 4.9 mEq/L (ref 3.5–5.1)

## 2011-08-05 LAB — GLUCOSE, CAPILLARY
Glucose-Capillary: 226 mg/dL — ABNORMAL HIGH (ref 70–99)
Glucose-Capillary: 185 mg/dL — ABNORMAL HIGH (ref 70–99)
Glucose-Capillary: 234 mg/dL — ABNORMAL HIGH (ref 70–99)
Glucose-Capillary: 165 mg/dL — ABNORMAL HIGH (ref 70–99)

## 2011-08-05 LAB — CARDIAC PANEL(CRET KIN+CKTOT+MB+TROPI)
CK, MB: 3.5 ng/mL (ref 0.3–4.0)
Troponin I: <0.3 ng/mL (ref <0.30)

## 2011-08-05 LAB — PROTIME-INR: Prothrombin Time: 13.6 seconds (ref 11.6–15.2)

## 2011-08-05 SURGERY — LEFT HEART CATHETERIZATION WITH CORONARY/GRAFT ANGIOGRAM
Anesthesia: LOCAL

## 2011-08-05 MED ORDER — HEPARIN (PORCINE) IN NACL 2-0.9 UNIT/ML-% IJ SOLN
INTRAMUSCULAR | Status: AC
  Filled 2011-08-05: qty 2000

## 2011-08-05 MED ORDER — ONDANSETRON HCL 4 MG/2ML IJ SOLN: 4.0000 mg | Freq: Four times a day (QID) | INTRAMUSCULAR | Status: DC | PRN

## 2011-08-05 MED ORDER — FENTANYL CITRATE 0.05 MG/ML IJ SOLN
INTRAMUSCULAR | Status: AC
  Filled 2011-08-05: qty 2

## 2011-08-05 MED ORDER — SODIUM CHLORIDE 0.9 % IV SOLN: 250.0000 mL | INTRAVENOUS | Status: DC

## 2011-08-05 MED ORDER — MIDAZOLAM HCL 2 MG/2ML IJ SOLN
INTRAMUSCULAR | Status: AC
  Filled 2011-08-05: qty 2

## 2011-08-05 MED ORDER — INSULIN ASPART 100 UNIT/ML ~~LOC~~ SOLN
0.0000 [IU] | Freq: Three times a day (TID) | SUBCUTANEOUS | Status: DC
  Administered 2011-08-05: 2 [IU] via SUBCUTANEOUS
  Administered 2011-08-06: 1 [IU] via SUBCUTANEOUS

## 2011-08-05 MED ORDER — SODIUM CHLORIDE 0.9 % IJ SOLN: 3.0000 mL | INTRAMUSCULAR | Status: DC | PRN

## 2011-08-05 MED ORDER — LIDOCAINE HCL (PF) 1 % IJ SOLN
INTRAMUSCULAR | Status: AC
  Filled 2011-08-05: qty 30

## 2011-08-05 MED ORDER — NITROGLYCERIN 0.2 MG/ML ON CALL CATH LAB
INTRAVENOUS | Status: AC
  Filled 2011-08-05: qty 1

## 2011-08-05 MED ORDER — FUROSEMIDE 40 MG PO TABS
40.0000 mg | ORAL_TABLET | Freq: Two times a day (BID) | ORAL | Status: DC
  Administered 2011-08-05 – 2011-08-06 (×2): 40 mg via ORAL
  Filled 2011-08-05 (×4): qty 1

## 2011-08-05 MED ORDER — ACETAMINOPHEN 325 MG PO TABS: 650.0000 mg | ORAL_TABLET | ORAL | Status: DC | PRN

## 2011-08-05 MED ORDER — HEPARIN SODIUM (PORCINE) 5000 UNIT/ML IJ SOLN
5000.0000 [IU] | Freq: Three times a day (TID) | INTRAMUSCULAR | Status: DC
  Administered 2011-08-05 – 2011-08-06 (×3): 5000 [IU] via SUBCUTANEOUS
  Filled 2011-08-05 (×6): qty 1

## 2011-08-05 MED ORDER — SODIUM CHLORIDE 0.9 % IJ SOLN
3.0000 mL | Freq: Two times a day (BID) | INTRAMUSCULAR | Status: DC
  Administered 2011-08-05: 3 mL via INTRAVENOUS

## 2011-08-05 NOTE — Progress Notes (Signed)
  Echocardiogram 2D Echocardiogram has been performed.  Jorje Guild [facility] 08/05/2011, 2:49 PM

## 2011-08-05 NOTE — CV Procedure (Signed)
   Cardiac Catheterization Procedure Note  Name: Luke Pittman MRN: [id number] DOB: 1945-12-29  Procedure: Left Heart Cath, Selective Coronary Angiography, LV angiography  Indication: CHF   Procedural details: The right groin was prepped, draped, and anesthetized with 1% lidocaine. Using modified Seldinger technique, a 5 French sheath was introduced into the right femoral artery. Standard Judkins catheters were used for coronary angiography, SVG angiography, SVG angiography, and left ventriculography. Catheter exchanges were performed over a guidewire. There were no immediate procedural complications. The patient was transferred to the post catheterization recovery area for further monitoring.  Procedural Findings: Hemodynamics:  AO 105/60 LV 102/11   Coronary angiography: Coronary dominance: right  Left mainstem: 30% stenosis.   Left anterior descending (LAD): Totally occluded at the ostium.  LIMA-LAD patent with good flow to the distal LAD and back to the proximal LAD.   Left circumflex (LCx): Occluded OM1 and OM2.  Mild disease only the AV LCx. Sequential SVG-OM1 and OM2 with minimal disease.  OM1 and OM2 with minimal disease after touchdowns.   Right coronary artery (RCA): Serial 90% stenoses in the mid and distal RCA with total occlusion at the crux.  SVG-PDA is patent with good flow in the PDA and back to the PLV.   Left ventriculography: EF estimated at 25% with peri-apical akinesis.  No significant MR.   Final Conclusions:  Patent bypass grafts with known native vessel disease.  LVEDP is only 11 but patient has been getting IV Lasix.  EF 25% (consistent with prior echo).   Recommendations: Continue medical management of CHF.  Lasix can be changed to po.   Marca Ancona 08/05/2011, 12:02 PM

## 2011-08-05 NOTE — Progress Notes (Signed)
Nursing Note: Pt back to room from cath lab. Pt stable on observation. Right groin level 0 with minimum swelling no bleeding. Family at bedside. Pt educated on post catheterization instructions. Pt verbalized understanding. Will continue to monitor pt appropriately. Elena Davia Scientist, clinical (histocompatibility and immunogenetics).

## 2011-08-05 NOTE — H&P (View-Only) (Signed)
  SUBJECTIVE:  Breathing better.  No chest pain.     PHYSICAL EXAM Filed Vitals:   08/04/11 2022 08/04/11 2147 08/05/11 0256 08/05/11 0631  BP: 119/62 105/63 125/49 107/62  Pulse: 84 87 86 88  Temp: 97.6 F (36.4 C) 97.8 F (36.6 C) 97.4 F (36.3 C) 97.7 F (36.5 C)  TempSrc: Oral  Oral   Resp: 22 22 20 20  Height:    5' 11" (1.803 m)  Weight:  168 lb 3.4 oz (76.3 kg)  167 lb 15.9 oz (76.2 kg)  SpO2: 97% 96% 98% 96%   General:  No distress Lungs:  Clear Heart:  RRR, ectopy Abdomen:  Positive bowel sounds, no rebound no guarding Extremities:  No edema  LABS: Lab Results  Component Value Date   CKTOTAL 125 08/05/2011   CKMB 3.5 08/05/2011   TROPONINI <0.30 08/05/2011   Results for orders placed during the hospital encounter of 08/04/11 (from the past 24 hour(s))  CBC     Status: Abnormal   Collection Time   08/04/11  2:20 PM      Component Value Range   WBC 7.7  4.0 - 10.5 (K/uL)   RBC 4.13 (*) 4.22 - 5.81 (MIL/uL)   Hemoglobin 10.7 (*) 13.0 - 17.0 (g/dL)   HCT 33.6 (*) 39.0 - 52.0 (%)   MCV 81.4  78.0 - 100.0 (fL)   MCH 25.9 (*) 26.0 - 34.0 (pg)   MCHC 31.8  30.0 - 36.0 (g/dL)   RDW 15.7 (*) 11.5 - 15.5 (%)   Platelets 203  150 - 400 (K/uL)  DIFFERENTIAL     Status: Normal   Collection Time   08/04/11  2:20 PM      Component Value Range   Neutrophils Relative 61  43 - 77 (%)   Neutro Abs 4.7  1.7 - 7.7 (K/uL)   Lymphocytes Relative 25  12 - 46 (%)   Lymphs Abs 1.9  0.7 - 4.0 (K/uL)   Monocytes Relative 10  3 - 12 (%)   Monocytes Absolute 0.8  0.1 - 1.0 (K/uL)   Eosinophils Relative 4  0 - 5 (%)   Eosinophils Absolute 0.3  0.0 - 0.7 (K/uL)   Basophils Relative 0  0 - 1 (%)   Basophils Absolute 0.0  0.0 - 0.1 (K/uL)  COMPREHENSIVE METABOLIC PANEL     Status: Abnormal   Collection Time   08/04/11  2:20 PM      Component Value Range   Sodium 140  135 - 145 (mEq/L)   Potassium 4.9  3.5 - 5.1 (mEq/L)   Chloride 106  96 - 112 (mEq/L)   CO2 23  19 - 32 (mEq/L)   Glucose, Bld 154 (*) 70 - 99 (mg/dL)   BUN 21  6 - 23 (mg/dL)   Creatinine, Ser 1.04  0.50 - 1.35 (mg/dL)   Calcium 9.3  8.4 - 10.5 (mg/dL)   Total Protein 6.5  6.0 - 8.3 (g/dL)   Albumin 3.6  3.5 - 5.2 (g/dL)   AST 24  0 - 37 (U/L)   ALT 37  0 - 53 (U/L)   Alkaline Phosphatase 53  39 - 117 (U/L)   Total Bilirubin 0.5  0.3 - 1.2 (mg/dL)   GFR calc non Af Amer 73 (*) >90 (mL/min)   GFR calc Af Amer 85 (*) >90 (mL/min)  PRO B NATRIURETIC PEPTIDE     Status: Abnormal   Collection Time   08/04/11  2:20   PM      Component Value Range   Pro B Natriuretic peptide (BNP) 1276.0 (*) 0 - 125 (pg/mL)  POCT I-STAT TROPONIN I     Status: Normal   Collection Time   08/04/11  2:45 PM      Component Value Range   Troponin i, poc 0.01  0.00 - 0.08 (ng/mL)   Comment 3           URINALYSIS, ROUTINE W REFLEX MICROSCOPIC     Status: Abnormal   Collection Time   08/04/11  3:56 PM      Component Value Range   Color, Urine YELLOW  YELLOW    APPearance CLEAR  CLEAR    Specific Gravity, Urine 1.012  1.005 - 1.030    pH 7.5  5.0 - 8.0    Glucose, UA 100 (*) NEGATIVE (mg/dL)   Hgb urine dipstick NEGATIVE  NEGATIVE    Bilirubin Urine NEGATIVE  NEGATIVE    Ketones, ur NEGATIVE  NEGATIVE (mg/dL)   Protein, ur NEGATIVE  NEGATIVE (mg/dL)   Urobilinogen, UA 0.2  0.0 - 1.0 (mg/dL)   Nitrite NEGATIVE  NEGATIVE    Leukocytes, UA NEGATIVE  NEGATIVE   MAGNESIUM     Status: Normal   Collection Time   08/04/11  5:52 PM      Component Value Range   Magnesium 1.8  1.5 - 2.5 (mg/dL)  TSH     Status: Abnormal   Collection Time   08/04/11  5:52 PM      Component Value Range   TSH 4.583 (*) 0.350 - 4.500 (uIU/mL)  CARDIAC PANEL(CRET KIN+CKTOT+MB+TROPI)     Status: Normal   Collection Time   08/04/11  5:52 PM      Component Value Range   Total CK 98  7 - 232 (U/L)   CK, MB 3.6  0.3 - 4.0 (ng/mL)   Troponin I <0.30  <0.30 (ng/mL)   Relative Index RELATIVE INDEX IS INVALID  0.0 - 2.5   GLUCOSE, CAPILLARY     Status:  Abnormal   Collection Time   08/04/11  6:14 PM      Component Value Range   Glucose-Capillary 128 (*) 70 - 99 (mg/dL)  CBC     Status: Abnormal   Collection Time   08/04/11 11:03 PM      Component Value Range   WBC 8.3  4.0 - 10.5 (K/uL)   RBC 4.48  4.22 - 5.81 (MIL/uL)   Hemoglobin 11.9 (*) 13.0 - 17.0 (g/dL)   HCT 36.1 (*) 39.0 - 52.0 (%)   MCV 80.6  78.0 - 100.0 (fL)   MCH 26.6  26.0 - 34.0 (pg)   MCHC 33.0  30.0 - 36.0 (g/dL)   RDW 15.6 (*) 11.5 - 15.5 (%)   Platelets 215  150 - 400 (K/uL)  CREATININE, SERUM     Status: Abnormal   Collection Time   08/04/11 11:03 PM      Component Value Range   Creatinine, Ser 1.17  0.50 - 1.35 (mg/dL)   GFR calc non Af Amer 64 (*) >90 (mL/min)   GFR calc Af Amer 74 (*) >90 (mL/min)  GLUCOSE, CAPILLARY     Status: Abnormal   Collection Time   08/04/11 11:18 PM      Component Value Range   Glucose-Capillary 247 (*) 70 - 99 (mg/dL)   Comment 1 Notify RN    CARDIAC PANEL(CRET KIN+CKTOT+MB+TROPI)     Status: Abnormal     Collection Time   08/05/11  1:32 AM      Component Value Range   Total CK 125  7 - 232 (U/L)   CK, MB 3.5  0.3 - 4.0 (ng/mL)   Troponin I <0.30  <0.30 (ng/mL)   Relative Index 2.8 (*) 0.0 - 2.5   BASIC METABOLIC PANEL     Status: Abnormal   Collection Time   08/05/11  1:40 AM      Component Value Range   Sodium 138  135 - 145 (mEq/L)   Potassium 4.9  3.5 - 5.1 (mEq/L)   Chloride 98  96 - 112 (mEq/L)   CO2 27  19 - 32 (mEq/L)   Glucose, Bld 273 (*) 70 - 99 (mg/dL)   BUN 24 (*) 6 - 23 (mg/dL)   Creatinine, Ser 1.13  0.50 - 1.35 (mg/dL)   Calcium 10.1  8.4 - 10.5 (mg/dL)   GFR calc non Af Amer 66 (*) >90 (mL/min)   GFR calc Af Amer 77 (*) >90 (mL/min)  CBC     Status: Abnormal   Collection Time   08/05/11  1:40 AM      Component Value Range   WBC 7.7  4.0 - 10.5 (K/uL)   RBC 4.62  4.22 - 5.81 (MIL/uL)   Hemoglobin 12.3 (*) 13.0 - 17.0 (g/dL)   HCT 37.2 (*) 39.0 - 52.0 (%)   MCV 80.5  78.0 - 100.0 (fL)   MCH 26.6  26.0 -  34.0 (pg)   MCHC 33.1  30.0 - 36.0 (g/dL)   RDW 15.7 (*) 11.5 - 15.5 (%)   Platelets 212  150 - 400 (K/uL)  PROTIME-INR     Status: Normal   Collection Time   08/05/11  1:40 AM      Component Value Range   Prothrombin Time 13.6  11.6 - 15.2 (seconds)   INR 1.02  0.00 - 1.49   GLUCOSE, CAPILLARY     Status: Abnormal   Collection Time   08/05/11  6:55 AM      Component Value Range   Glucose-Capillary 226 (*) 70 - 99 (mg/dL)    Intake/Output Summary (Last 24 hours) at 08/05/11 0839 Last data filed at 08/05/11 0635  Gross per 24 hour  Intake    487 ml  Output   3350 ml  Net  -2863 ml    ASSESSMENT AND PLAN: 1)  Dyspnea:  Evidence of CHF.  Echo pending and cath today.    2)  NIDDM:  Sugars elevated this admission.  He will need medication adjustment at discharge and I will discuss with his primary provider.   Valleri Hendricksen 08/05/2011 8:39 AM   

## 2011-08-05 NOTE — Progress Notes (Signed)
SUBJECTIVE:  Breathing better.  No chest pain.     PHYSICAL EXAM Filed Vitals:   08/04/11 2022 08/04/11 2147 08/05/11 0256 08/05/11 0631  BP: 119/62 105/63 125/49 107/62  Pulse: 84 87 86 88  Temp: 97.6 F (36.4 C) 97.8 F (36.6 C) 97.4 F (36.3 C) 97.7 F (36.5 C)  TempSrc: Oral  Oral   Resp: 22 22 20 20   Height:    5\' 11"  (1.803 m)  Weight:  168 lb 3.4 oz (76.3 kg)  167 lb 15.9 oz (76.2 kg)  SpO2: 97% 96% 98% 96%   General:  No distress Lungs:  Clear Heart:  RRR, ectopy Abdomen:  Positive bowel sounds, no rebound no guarding Extremities:  No edema  LABS: Lab Results  Component Value Date   CKTOTAL 125 08/05/2011   CKMB 3.5 08/05/2011   TROPONINI <0.30 08/05/2011   Results for orders placed during the hospital encounter of 08/04/11 (from the past 24 hour(s))  CBC     Status: Abnormal   Collection Time   08/04/11  2:20 PM      Component Value Range   WBC 7.7  4.0 - 10.5 (K/uL)   RBC 4.13 (*) 4.22 - 5.81 (MIL/uL)   Hemoglobin 10.7 (*) 13.0 - 17.0 (g/dL)   HCT 16.1 (*) 09.6 - 52.0 (%)   MCV 81.4  78.0 - 100.0 (fL)   MCH 25.9 (*) 26.0 - 34.0 (pg)   MCHC 31.8  30.0 - 36.0 (g/dL)   RDW 04.5 (*) 40.9 - 15.5 (%)   Platelets 203  150 - 400 (K/uL)  DIFFERENTIAL     Status: Normal   Collection Time   08/04/11  2:20 PM      Component Value Range   Neutrophils Relative 61  43 - 77 (%)   Neutro Abs 4.7  1.7 - 7.7 (K/uL)   Lymphocytes Relative 25  12 - 46 (%)   Lymphs Abs 1.9  0.7 - 4.0 (K/uL)   Monocytes Relative 10  3 - 12 (%)   Monocytes Absolute 0.8  0.1 - 1.0 (K/uL)   Eosinophils Relative 4  0 - 5 (%)   Eosinophils Absolute 0.3  0.0 - 0.7 (K/uL)   Basophils Relative 0  0 - 1 (%)   Basophils Absolute 0.0  0.0 - 0.1 (K/uL)  COMPREHENSIVE METABOLIC PANEL     Status: Abnormal   Collection Time   08/04/11  2:20 PM      Component Value Range   Sodium 140  135 - 145 (mEq/L)   Potassium 4.9  3.5 - 5.1 (mEq/L)   Chloride 106  96 - 112 (mEq/L)   CO2 23  19 - 32 (mEq/L)   Glucose, Bld 154 (*) 70 - 99 (mg/dL)   BUN 21  6 - 23 (mg/dL)   Creatinine, Ser 8.11  0.50 - 1.35 (mg/dL)   Calcium 9.3  8.4 - 91.4 (mg/dL)   Total Protein 6.5  6.0 - 8.3 (g/dL)   Albumin 3.6  3.5 - 5.2 (g/dL)   AST 24  0 - 37 (U/L)   ALT 37  0 - 53 (U/L)   Alkaline Phosphatase 53  39 - 117 (U/L)   Total Bilirubin 0.5  0.3 - 1.2 (mg/dL)   GFR calc non Af Amer 73 (*) >90 (mL/min)   GFR calc Af Amer 85 (*) >90 (mL/min)  PRO B NATRIURETIC PEPTIDE     Status: Abnormal   Collection Time   08/04/11  2:20  PM      Component Value Range   Pro B Natriuretic peptide (BNP) 1276.0 (*) 0 - 125 (pg/mL)  POCT I-STAT TROPONIN I     Status: Normal   Collection Time   08/04/11  2:45 PM      Component Value Range   Troponin i, poc 0.01  0.00 - 0.08 (ng/mL)   Comment 3           URINALYSIS, ROUTINE W REFLEX MICROSCOPIC     Status: Abnormal   Collection Time   08/04/11  3:56 PM      Component Value Range   Color, Urine YELLOW  YELLOW    APPearance CLEAR  CLEAR    Specific Gravity, Urine 1.012  1.005 - 1.030    pH 7.5  5.0 - 8.0    Glucose, UA 100 (*) NEGATIVE (mg/dL)   Hgb urine dipstick NEGATIVE  NEGATIVE    Bilirubin Urine NEGATIVE  NEGATIVE    Ketones, ur NEGATIVE  NEGATIVE (mg/dL)   Protein, ur NEGATIVE  NEGATIVE (mg/dL)   Urobilinogen, UA 0.2  0.0 - 1.0 (mg/dL)   Nitrite NEGATIVE  NEGATIVE    Leukocytes, UA NEGATIVE  NEGATIVE   MAGNESIUM     Status: Normal   Collection Time   08/04/11  5:52 PM      Component Value Range   Magnesium 1.8  1.5 - 2.5 (mg/dL)  TSH     Status: Abnormal   Collection Time   08/04/11  5:52 PM      Component Value Range   TSH 4.583 (*) 0.350 - 4.500 (uIU/mL)  CARDIAC PANEL(CRET KIN+CKTOT+MB+TROPI)     Status: Normal   Collection Time   08/04/11  5:52 PM      Component Value Range   Total CK 98  7 - 232 (U/L)   CK, MB 3.6  0.3 - 4.0 (ng/mL)   Troponin I <0.30  <0.30 (ng/mL)   Relative Index RELATIVE INDEX IS INVALID  0.0 - 2.5   GLUCOSE, CAPILLARY     Status:  Abnormal   Collection Time   08/04/11  6:14 PM      Component Value Range   Glucose-Capillary 128 (*) 70 - 99 (mg/dL)  CBC     Status: Abnormal   Collection Time   08/04/11 11:03 PM      Component Value Range   WBC 8.3  4.0 - 10.5 (K/uL)   RBC 4.48  4.22 - 5.81 (MIL/uL)   Hemoglobin 11.9 (*) 13.0 - 17.0 (g/dL)   HCT 96.2 (*) 95.2 - 52.0 (%)   MCV 80.6  78.0 - 100.0 (fL)   MCH 26.6  26.0 - 34.0 (pg)   MCHC 33.0  30.0 - 36.0 (g/dL)   RDW 84.1 (*) 32.4 - 15.5 (%)   Platelets 215  150 - 400 (K/uL)  CREATININE, SERUM     Status: Abnormal   Collection Time   08/04/11 11:03 PM      Component Value Range   Creatinine, Ser 1.17  0.50 - 1.35 (mg/dL)   GFR calc non Af Amer 64 (*) >90 (mL/min)   GFR calc Af Amer 74 (*) >90 (mL/min)  GLUCOSE, CAPILLARY     Status: Abnormal   Collection Time   08/04/11 11:18 PM      Component Value Range   Glucose-Capillary 247 (*) 70 - 99 (mg/dL)   Comment 1 Notify RN    CARDIAC PANEL(CRET KIN+CKTOT+MB+TROPI)     Status: Abnormal  Collection Time   08/05/11  1:32 AM      Component Value Range   Total CK 125  7 - 232 (U/L)   CK, MB 3.5  0.3 - 4.0 (ng/mL)   Troponin I <0.30  <0.30 (ng/mL)   Relative Index 2.8 (*) 0.0 - 2.5   BASIC METABOLIC PANEL     Status: Abnormal   Collection Time   08/05/11  1:40 AM      Component Value Range   Sodium 138  135 - 145 (mEq/L)   Potassium 4.9  3.5 - 5.1 (mEq/L)   Chloride 98  96 - 112 (mEq/L)   CO2 27  19 - 32 (mEq/L)   Glucose, Bld 273 (*) 70 - 99 (mg/dL)   BUN 24 (*) 6 - 23 (mg/dL)   Creatinine, Ser 9.60  0.50 - 1.35 (mg/dL)   Calcium 45.4  8.4 - 10.5 (mg/dL)   GFR calc non Af Amer 66 (*) >90 (mL/min)   GFR calc Af Amer 77 (*) >90 (mL/min)  CBC     Status: Abnormal   Collection Time   08/05/11  1:40 AM      Component Value Range   WBC 7.7  4.0 - 10.5 (K/uL)   RBC 4.62  4.22 - 5.81 (MIL/uL)   Hemoglobin 12.3 (*) 13.0 - 17.0 (g/dL)   HCT 09.8 (*) 11.9 - 52.0 (%)   MCV 80.5  78.0 - 100.0 (fL)   MCH 26.6  26.0 -  34.0 (pg)   MCHC 33.1  30.0 - 36.0 (g/dL)   RDW 14.7 (*) 82.9 - 15.5 (%)   Platelets 212  150 - 400 (K/uL)  PROTIME-INR     Status: Normal   Collection Time   08/05/11  1:40 AM      Component Value Range   Prothrombin Time 13.6  11.6 - 15.2 (seconds)   INR 1.02  0.00 - 1.49   GLUCOSE, CAPILLARY     Status: Abnormal   Collection Time   08/05/11  6:55 AM      Component Value Range   Glucose-Capillary 226 (*) 70 - 99 (mg/dL)    Intake/Output Summary (Last 24 hours) at 08/05/11 0839 Last data filed at 08/05/11 5621  Gross per 24 hour  Intake    487 ml  Output   3350 ml  Net  -2863 ml    ASSESSMENT AND PLAN: 1)  Dyspnea:  Evidence of CHF.  Echo pending and cath today.    2)  NIDDM:  Sugars elevated this admission.  He will need medication adjustment at discharge and I will discuss with his primary provider.   Rollene Rotunda 08/05/2011 8:39 AM

## 2011-08-05 NOTE — Interval H&P Note (Signed)
History and Physical Interval Note:  08/05/2011 11:15 AM  Luke Pittman  has presented today for surgery, with the diagnosis of Chest pain  The various methods of treatment have been discussed with the patient and family. After consideration of risks, benefits and other options for treatment, the patient has consented to  Procedure(s) (LRB): LEFT HEART CATHETERIZATION WITH CORONARY/GRAFT ANGIOGRAM (N/A) as a surgical intervention .  The patients' history has been reviewed, patient examined, no change in status, stable for surgery.  I have reviewed the patients' chart and labs.  Questions were answered to the patient's satisfaction.     Zari Cly [employer]

## 2011-08-06 ENCOUNTER — Encounter (HOSPITAL_COMMUNITY): Payer: Self-pay | Admitting: Physician Assistant

## 2011-08-06 LAB — BASIC METABOLIC PANEL
GFR calc Af Amer: 72 mL/min — ABNORMAL LOW (ref >90)
GFR calc non Af Amer: 62 mL/min — ABNORMAL LOW (ref >90)
Potassium: 4.8 mEq/L (ref 3.5–5.1)
Sodium: 137 mEq/L (ref 135–145)

## 2011-08-06 LAB — GLUCOSE, CAPILLARY

## 2011-08-06 MED ORDER — METFORMIN HCL 500 MG PO TABS: 500.0000 mg | ORAL_TABLET | ORAL | Status: DC

## 2011-08-06 MED ORDER — FUROSEMIDE 20 MG PO TABS: 20.0000 mg | ORAL_TABLET | Freq: Every day | ORAL | Status: DC

## 2011-08-06 NOTE — Progress Notes (Signed)
08/06/2011 Rhealynn Myhre SPARKS Case Management Note [phone]  Utilization review completed.  

## 2011-08-06 NOTE — Discharge Summary (Signed)
Discharge Summary   Patient ID: Luke Pittman MRN: [id number], DOB/AGE: May 28, 1945 66 y.o. Admit date: 08/04/2011 D/C date:     08/06/2011   Primary Discharge Diagnoses:  1. Acute systolic CHF 2. Ischemic cardiomyopathy (EF 25% by echo 08/05/11, in line with prior) 3. CAD s/p CABG 1994 - patent grafts by cath this admission 4. Abnormal TSH - for f/u PCP  Secondary Discharge Diagnoses:  1. NIDDM 2. HL 3. Anemia  Hospital Course: Luke Pittman is a 66yo male with PMHx significant for CAD s/p CABG, previous ICM, DM, HL who presented to [facility] with complaints of SOB. The patient was last seen in the office in 05/2011 by Dr. Antoine Poche; there was a note indicating that over the years stress testing/echocardiography was recommended but the patient refused due to cost. He presented to the ER with complaints of increased DOE with yardwork, mild abdominal swelling, some weight gain. He denied CP, palpitations, diaphoresis, nausea or vomiting. Upon ED arrival, EKG reveals old anterior infarct (Q waves V1-V3), TWIs V5, V6; 1 mm ST elevation V1-V3. POC TnI WNL. pBNP elevated at 1276. CXR reveals cardiomegaly, small L pleural effusion with left basilar atelectasis vs infiltrate. H/H mildly decreased at 10.7/33.6. He was given Lasix 80mg  IV x 1 before cardiology evaluation. It was felt that his presentation was most consistent with acute on chronic CHF. However there was no clear exacerbating factor. Cath was planned to r/o ischemia. He was initiated on IV diuresis at 40mg  IV Lasix q8hr. He was not tachypnic or hypoxic. His breathing improved with diuresis. He underwent cath 08/05/11 demonstrating known native vessel disease but patent bypass grafts. LVEDP was only 11 and Dr. Shirlee Latch recommended changing Lasix to PO. His weight had decreased from 185-168lbs from admission. Today he is doing well. His BP at this time will not allow for additional medication addition/titration. The patient was seen and examined  today and felt stable for discharge by Dr. Antoine Poche. He will be sent home on Lasix 20mg  daily.  Per discussion with our [facility] team, he can go next door to [facility] to have labs drawn next week. Since Dr. Jenene Slicker next available isn't until end of May in [place], we hope to get him seen sooner in the [place] office. Of note, with persistently low EF he may be a candidate for discussion regarding ICD placement.  Discharge Vitals: Blood pressure 102/61, pulse 92, temperature 97.8 F (36.6 C), temperature source Oral, resp. rate 18, height 5\' 11"  (1.803 m), weight 168 lb 6.9 oz (76.4 kg), SpO2 100.00%.  Labs: Lab Results  Component Value Date   WBC 6.9 08/05/2011   HGB 12.4* 08/05/2011   HCT 36.8* 08/05/2011   MCV 80.0 08/05/2011   PLT 218 08/05/2011    Lab 08/06/11 0526 08/04/11 1420  NA 137 --  K 4.8 --  CL 101 --  CO2 26 --  BUN 26* --  CREATININE 1.20 --  CALCIUM 9.4 --  PROT -- 6.5  BILITOT -- 0.5  ALKPHOS -- 53  ALT -- 37  AST -- 24  GLUCOSE 133* --    Basename 08/05/11 0901 08/05/11 0132 08/04/11 1752  CKTOTAL 166 125 98  CKMB 3.8 3.5 3.6  TROPONINI <0.30 <0.30 <0.30     Diagnostic Studies/Procedures   1. Chest 2 View 08/04/2011  *RADIOLOGY REPORT*  Clinical Data: Shortness of breath, status post CABG  CHEST - 2 VIEW  Comparison: None  Findings: Borderline cardiomegaly noted.  The patient is status post  CABG.  There is small left pleural effusion with left basilar atelectasis or infiltrate.  No pulmonary edema.  IMPRESSION: Cardiomegaly.  Small left pleural effusion with left basilar atelectasis or infiltrate.  Original Report Authenticated By: Natasha Mead, M.D.   2. 08/05/11 2D Echo Study Conclusions - Left ventricle: The cavity size was severely dilated. Wall thickness was normal. Systolic function was severely reduced. The estimated ejection fraction was in the range of 20% to 25%. - Mitral valve: Mild regurgitation. - Left atrium: The atrium was moderately dilated. - Right  ventricle: The cavity size was moderately dilated. - Right atrium: The atrium was mildly dilated.  Discharge Medications   Medication List  As of 08/06/2011  9:28 AM   TAKE these medications         aspirin 325 MG EC tablet   Take 325 mg by mouth daily.      cholecalciferol 1000 UNITS tablet   Commonly known as: VITAMIN D   Take 1,000 Units by mouth daily.      ezetimibe 10 MG tablet   Commonly known as: ZETIA   Take 10 mg by mouth daily.      furosemide 20 MG tablet   Commonly known as: LASIX   Take 1 tablet (20 mg total) by mouth daily.      glimepiride 1 MG tablet   Commonly known as: AMARYL   Take 1 mg by mouth 2 (two) times daily as needed. If BG is over 120      lisinopril 20 MG tablet   Commonly known as: PRINIVIL,ZESTRIL   Take 20 mg by mouth daily.      metFORMIN 500 MG tablet   Commonly known as: GLUCOPHAGE   Take 1 tablet (500 mg total) by mouth as directed. Takes 1 tablet in the morning and 3 tablets in the evening - IMPORTANT: Do not restart until tomorrow evening (08/07/11). Talk to your primary care doctor about alternatives to this medicine because it is less preferred in patients with history of heart failure.       pravastatin 40 MG tablet   Commonly known as: PRAVACHOL   Take 80 mg by mouth at bedtime.            Disposition   The patient will be discharged in stable condition to home. Discharge Orders    Future Appointments: Provider: Department: Dept Phone: Center:   08/18/2011 11:00 AM Ok Anis, NP [facility] [phone] [facility]   08/27/2011 12:00 PM Rollene Rotunda, MD [facility] [phone] [facility]     Future Orders Please Complete By Expires   Diet - low sodium heart healthy      Comments:   RESTRICT YOUR SALT!!   Increase activity slowly      Comments:   No driving for 2 days. No lifting over 5 lbs for 1 week. No sexual activity for 1 week. Keep procedure site clean & dry. If you notice increased pain,  swelling, bleeding or pus, call/return!  You may shower, but no soaking baths/hot tubs/pools for 1 week.        Follow-up Information    Follow up with Primary Care Provider. (Please follow up with primary doctor for better diabetes control and to follow your mild anemia. Your thyroid function was also slightly abnormal which can be seen in acute illness but needs to be rechecked.  )       Follow up with Labwork. (Take [facility] to [facility] on  08/13/11 to get labs drawn. )       Follow up with Nicolasa Ducking, NP. [facility] - you will see Nicolasa Ducking, NP for your 1st hospital follow-up appointment on 08/18/11 at 11am)    Contact information:   [address] [place] [address] [phone]            Duration of Discharge Encounter: Greater than 30 minutes including physician and PA time.  Signed, Ronie Spies PA-C 08/06/2011, 9:28 AM  Patient seen and examined.  Plan as discussed in my rounding note for today and outlined above. Rollene Rotunda  08/06/2011  9:53 AM

## 2011-08-06 NOTE — Progress Notes (Signed)
SUBJECTIVE:  Breathing at baseline.  No chest pain.     PHYSICAL EXAM Filed Vitals:   08/05/11 1830 08/05/11 2141 08/06/11 0138 08/06/11 0559  BP: 95/54 94/52 97/62  102/61  Pulse: 85 87 81 92  Temp: 97.5 F (36.4 C) 97.6 F (36.4 C) 97.8 F (36.6 C) 97.8 F (36.6 C)  TempSrc:  Oral Oral Oral  Resp: 18 18 18 18   Height:      Weight:    168 lb 6.9 oz (76.4 kg)  SpO2: 100% 100% 99% 100%   General:  No distress Lungs:  Clear Heart:  RRR, ectopy Abdomen:  Positive bowel sounds, no rebound no guarding Extremities:  No edema  LABS: Lab Results  Component Value Date   CKTOTAL 166 08/05/2011   CKMB 3.8 08/05/2011   TROPONINI <0.30 08/05/2011   Results for orders placed during the hospital encounter of 08/04/11 (from the past 24 hour(s))  GLUCOSE, CAPILLARY     Status: Abnormal   Collection Time   08/05/11  6:55 AM      Component Value Range   Glucose-Capillary 226 (*) 70 - 99 (mg/dL)  CARDIAC PANEL(CRET KIN+CKTOT+MB+TROPI)     Status: Normal   Collection Time   08/05/11  9:01 AM      Component Value Range   Total CK 166  7 - 232 (U/L)   CK, MB 3.8  0.3 - 4.0 (ng/mL)   Troponin I <0.30  <0.30 (ng/mL)   Relative Index 2.3  0.0 - 2.5   GLUCOSE, CAPILLARY     Status: Abnormal   Collection Time   08/05/11 12:01 PM      Component Value Range   Glucose-Capillary 185 (*) 70 - 99 (mg/dL)  CBC     Status: Abnormal   Collection Time   08/05/11  1:57 PM      Component Value Range   WBC 6.9  4.0 - 10.5 (K/uL)   RBC 4.60  4.22 - 5.81 (MIL/uL)   Hemoglobin 12.4 (*) 13.0 - 17.0 (g/dL)   HCT 65.7 (*) 84.6 - 52.0 (%)   MCV 80.0  78.0 - 100.0 (fL)   MCH 27.0  26.0 - 34.0 (pg)   MCHC 33.7  30.0 - 36.0 (g/dL)   RDW 96.2 (*) 95.2 - 15.5 (%)   Platelets 218  150 - 400 (K/uL)  CREATININE, SERUM     Status: Abnormal   Collection Time   08/05/11  1:57 PM      Component Value Range   Creatinine, Ser 0.99  0.50 - 1.35 (mg/dL)   GFR calc non Af Amer 84 (*) >90 (mL/min)   GFR calc Af Amer >90   >90 (mL/min)  GLUCOSE, CAPILLARY     Status: Abnormal   Collection Time   08/05/11  4:09 PM      Component Value Range   Glucose-Capillary 234 (*) 70 - 99 (mg/dL)   Comment 1 Notify RN    GLUCOSE, CAPILLARY     Status: Abnormal   Collection Time   08/05/11  9:37 PM      Component Value Range   Glucose-Capillary 165 (*) 70 - 99 (mg/dL)   Comment 1 Notify RN    BASIC METABOLIC PANEL     Status: Abnormal   Collection Time   08/06/11  5:26 AM      Component Value Range   Sodium 137  135 - 145 (mEq/L)   Potassium 4.8  3.5 - 5.1 (mEq/L)   Chloride  101  96 - 112 (mEq/L)   CO2 26  19 - 32 (mEq/L)   Glucose, Bld 133 (*) 70 - 99 (mg/dL)   BUN 26 (*) 6 - 23 (mg/dL)   Creatinine, Ser 1.61  0.50 - 1.35 (mg/dL)   Calcium 9.4  8.4 - 09.6 (mg/dL)   GFR calc non Af Amer 62 (*) >90 (mL/min)   GFR calc Af Amer 72 (*) >90 (mL/min)    Intake/Output Summary (Last 24 hours) at 08/06/11 0647 Last data filed at 08/06/11 0500  Gross per 24 hour  Intake 2215.67 ml  Output   1675 ml  Net 540.67 ml    ASSESSMENT AND PLAN: 1)  Dyspnea:  Evidence of CHF.  Cath with patent bypass grafts.  Echo with EF 25%.  Breathing better.  He was not on Lasix at admission.  I will send him home on 20 mg po daily.  Salt restrict.  BP will not otherwise allow med titration.  Needs a BMET in one week.  See me in [place] next available.  2)  NIDDM:  Sugars elevated this admission.  He will need medication adjustment as an outpatient per his primary provider.   Fayrene Fearing [facility] 08/06/2011 6:47 AM

## 2011-08-06 NOTE — Progress Notes (Signed)
IV d/c'ed. Tele d/c'ed. D/C instructions and medications discussed with pt and pt's wife. Stressed importance of low salt diet, fluid restriction, and weighing self daily. Pt states he will buy scale today and is able to state examples of foods he should avoid. Pt refused w/c and was escorted out with staff. Pt d/c'ed to home.

## 2011-08-13 ENCOUNTER — Encounter: Payer: Self-pay | Admitting: Cardiology

## 2011-08-14 ENCOUNTER — Encounter: Payer: Self-pay | Admitting: Cardiology

## 2011-08-14 ENCOUNTER — Telehealth: Payer: Self-pay | Admitting: *Deleted

## 2011-08-14 NOTE — Telephone Encounter (Signed)
Received lab results from [facility] - reviewed by Dr Hartford Poli was 6.4 - pt is to take kayexalate 15 gr today, and repeat BMP in the am.  Left message for pt to call back ASAP to discuss

## 2011-08-15 ENCOUNTER — Emergency Department (HOSPITAL_COMMUNITY)
Admission: EM | Admit: 2011-08-15 | Discharge: 2011-08-15 | Disposition: A | Discharge status: Home / Self Care | Payer: Medicare Other | Attending: Emergency Medicine | Admitting: Emergency Medicine

## 2011-08-15 ENCOUNTER — Encounter: Payer: Self-pay | Admitting: Cardiology

## 2011-08-15 ENCOUNTER — Emergency Department (HOSPITAL_COMMUNITY): Payer: Medicare Other

## 2011-08-15 ENCOUNTER — Encounter (HOSPITAL_COMMUNITY): Payer: Self-pay | Admitting: *Deleted

## 2011-08-15 ENCOUNTER — Telehealth: Payer: Self-pay | Admitting: Cardiology

## 2011-08-15 DIAGNOSIS — R5383 Other fatigue: Secondary | ICD-10-CM

## 2011-08-15 DIAGNOSIS — Z7982 Long term (current) use of aspirin: Secondary | ICD-10-CM | POA: Insufficient documentation

## 2011-08-15 DIAGNOSIS — I2510 Atherosclerotic heart disease of native coronary artery without angina pectoris: Secondary | ICD-10-CM | POA: Insufficient documentation

## 2011-08-15 DIAGNOSIS — Z951 Presence of aortocoronary bypass graft: Secondary | ICD-10-CM | POA: Insufficient documentation

## 2011-08-15 DIAGNOSIS — I509 Heart failure, unspecified: Secondary | ICD-10-CM | POA: Insufficient documentation

## 2011-08-15 DIAGNOSIS — E119 Type 2 diabetes mellitus without complications: Secondary | ICD-10-CM | POA: Insufficient documentation

## 2011-08-15 DIAGNOSIS — E785 Hyperlipidemia, unspecified: Secondary | ICD-10-CM | POA: Insufficient documentation

## 2011-08-15 DIAGNOSIS — E875 Hyperkalemia: Secondary | ICD-10-CM | POA: Insufficient documentation

## 2011-08-15 DIAGNOSIS — Z87891 Personal history of nicotine dependence: Secondary | ICD-10-CM | POA: Insufficient documentation

## 2011-08-15 DIAGNOSIS — R5381 Other malaise: Secondary | ICD-10-CM | POA: Insufficient documentation

## 2011-08-15 LAB — DIFFERENTIAL
Basophils Relative: 0 % (ref 0–1)
Lymphs Abs: 2.7 10*3/uL (ref 0.7–4.0)
Monocytes Absolute: 0.8 10*3/uL (ref 0.1–1.0)
Monocytes Relative: 9 % (ref 3–12)
Neutro Abs: 4.1 10*3/uL (ref 1.7–7.7)

## 2011-08-15 LAB — POCT I-STAT, CHEM 8
BUN: 43 mg/dL — ABNORMAL HIGH (ref 6–23)
Calcium, Ion: 1.26 mmol/L (ref 1.12–1.32)
Chloride: 107 mEq/L (ref 96–112)
Creatinine, Ser: 1.3 mg/dL (ref 0.50–1.35)
Glucose, Bld: 174 mg/dL — ABNORMAL HIGH (ref 70–99)

## 2011-08-15 LAB — CBC
HCT: 35.9 % — ABNORMAL LOW (ref 39.0–52.0)
Hemoglobin: 11.9 g/dL — ABNORMAL LOW (ref 13.0–17.0)
MCHC: 33.1 g/dL (ref 30.0–36.0)

## 2011-08-15 MED ORDER — SODIUM POLYSTYRENE SULFONATE 15 GM/60ML PO SUSP
15.0000 g | Freq: Once | ORAL | Status: AC
  Administered 2011-08-15: 15 g via ORAL
  Filled 2011-08-15: qty 60

## 2011-08-15 NOTE — ED Provider Notes (Signed)
D/w dr Charm Barges with cardiology He recommends stop ACE inhibitor, one dose of kayexalate (EKG unchanged with K of 5.5) And f/u next week with his PCP for recheck of potassium  Joya Gaskins, MD 08/15/11 2248

## 2011-08-15 NOTE — Telephone Encounter (Signed)
I called the patient to tell him that his potassium continues to rise.  It was slightly elevated yesterday but is today 6.8 after getting kayexalate 15 gm.  He is not on potassium and is on a stable dose of ACE inhibitor.  He has recently been started on Lasix. He was instructed to come to the ER now to received treatment and repeat labs.  He might need admission if he does not correct with repeat kayexalate.  His wife reports understanding and will come to [facility].

## 2011-08-15 NOTE — ED Provider Notes (Signed)
History     CSN: [id number]  Arrival date & time 08/15/11  1859   First MD Initiated Contact with Patient 08/15/11 2110      Chief Complaint  Patient presents with  . Weakness    (Consider location/radiation/quality/duration/timing/severity/associated sxs/prior treatment) Patient is a 66 y.o. male presenting with weakness. The history is provided by the patient.  Weakness Primary symptoms do not include headaches, fever, nausea or vomiting. Primary symptoms comment: weakness The symptoms began more than 1 week ago. The symptoms are unchanged. The neurological symptoms are diffuse. Context: after recent discharge.  Additional symptoms include weakness.    Past Medical History  Diagnosis Date  . CAD (coronary artery disease)     s/p CABG 1994. Patent grafts 07/2011  . CHF (congestive heart failure)     EF 25% in 2005, confirmed at 25% by eceho 07/2011  . Anemia   . NIDDM (non-insulin dependent diabetes mellitus)   . Hyperlipidemia     Past Surgical History  Procedure Date  . Coronary artery bypass graft     1994. LIMA to the LAD, sequential SVG to first and second obtuse marginal, sequential SVG to the right coronary artery.    Family History  Problem Relation Age of Onset  . Heart failure Mother   . Heart attack Father 57  . Heart attack Brother 104    Deceased at 47 from heart failure    History  Substance Use Topics  . Smoking status: Former Smoker    Types: Cigarettes  . Smokeless tobacco: Never Used   Comment: quit 40 years ago  . Alcohol Use: No      Review of Systems  Constitutional: Negative for fever.  HENT: Negative for rhinorrhea, drooling and neck pain.   Eyes: Negative for pain.  Respiratory: Negative for cough and shortness of breath.   Cardiovascular: Negative for chest pain and leg swelling.  Gastrointestinal: Negative for nausea, vomiting, abdominal pain and diarrhea.  Genitourinary: Negative for dysuria and hematuria.  Musculoskeletal:  Negative for gait problem.  Skin: Negative for color change.  Neurological: Positive for weakness. Negative for numbness and headaches.  Hematological: Negative for adenopathy.  Psychiatric/Behavioral: Negative for behavioral problems.  All other systems reviewed and are negative.    Allergies  Beta adrenergic blockers; Fish oil; Januvia; and Tetanus-diphtheria toxoids td  Home Medications   Current Outpatient Rx  Name Route Sig Dispense Refill  . ASPIRIN 325 MG PO TBEC Oral Take 325 mg by mouth daily.      Marland Kitchen VITAMIN D 1000 UNITS PO TABS Oral Take 1,000 Units by mouth daily.    Marland Kitchen EZETIMIBE 10 MG PO TABS Oral Take 10 mg by mouth daily.      . FUROSEMIDE 20 MG PO TABS Oral Take 20 mg by mouth daily.    Marland Kitchen GLIMEPIRIDE 1 MG PO TABS Oral Take 1 mg by mouth 2 (two) times daily as needed. If BG is over 120    . METFORMIN HCL 500 MG PO TABS Oral Take 500 mg by mouth as directed. Takes 1 tablet in the morning and 3 tablets in the evening    . PRAVASTATIN SODIUM 40 MG PO TABS Oral Take 80 mg by mouth at bedtime.      BP 136/74  Pulse 95  Temp(Src) 98.1 F (36.7 C) (Oral)  Resp 19  SpO2 98%  Physical Exam  Constitutional: He is oriented to person, place, and time. He appears well-developed and well-nourished.  HENT:  Head:  Normocephalic and atraumatic.  Right Ear: External ear normal.  Left Ear: External ear normal.  Nose: Nose normal.  Mouth/Throat: Oropharynx is clear and moist. No oropharyngeal exudate.  Eyes: Conjunctivae and EOM are normal. Pupils are equal, round, and reactive to light.  Neck: Normal range of motion. Neck supple.  Cardiovascular: Normal rate, regular rhythm, normal heart sounds and intact distal pulses.  Exam reveals no gallop and no friction rub.   No murmur heard. Pulmonary/Chest: Effort normal and breath sounds normal. No respiratory distress. He has no wheezes.  Abdominal: Soft. Bowel sounds are normal. He exhibits no distension. There is no tenderness.    Musculoskeletal: Normal range of motion. He exhibits no edema and no tenderness.  Neurological: He is alert and oriented to person, place, and time. He has normal strength.  Skin: Skin is warm and dry.  Psychiatric: He has a normal mood and affect. His behavior is normal.    ED Course  Procedures (including critical care time)  Labs Reviewed  CBC - Abnormal; Notable for the following:    Hemoglobin 11.9 (*)    HCT 35.9 (*)    RDW 15.6 (*)    All other components within normal limits  DIFFERENTIAL - Abnormal; Notable for the following:    Eosinophils Relative 6 (*)    All other components within normal limits  POCT I-STAT, CHEM 8 - Abnormal; Notable for the following:    Potassium 5.5 (*)    BUN 43 (*)    Glucose, Bld 174 (*)    Hemoglobin 12.9 (*)    HCT 38.0 (*)    All other components within normal limits   Dg Chest 2 View  08/15/2011  *RADIOLOGY REPORT*  Clinical Data: Weakness  CHEST - 2 VIEW  Comparison: 08/04/2011  Findings: Interval improved aeration of the left lower lobe. Stable cardiomediastinal contours, mildly prominent.  Status post median sternotomy and CABG.  No new areas of consolidation.  No pleural effusion or pneumothorax.  No acute osseous finding.  IMPRESSION: Interval clearing of the left lower lobe consolidation/effusion.  Cardiomegaly status post median sternotomy and CABG.  No radiographic evidence for acute cardiopulmonary process.  Original Report Authenticated By: Waneta Martins, M.D.     1. Fatigue   2. Hyperkalemia      Date: 08/15/2011  Rate: 87  Rhythm: normal sinus rhythm  QRS Axis: right  Intervals: normal  ST/T Wave abnormalities: nonspecific ST/T changes  Conduction Disutrbances:none  Narrative Interpretation: Non-specific ST-T wave abn's  Old EKG Reviewed: changes noted    MDM  11:17 PM 66 y.o. male w hx of CAD s/p CABG in '94 and recent cath presents by request of his cardiologist d/t elevated potassium at 6.9. Pt notes mild  diffuse weakness since being discharged from the hospital, but is asx on exam. Pt AFVSS here, no peaked t waves on ecg. Will get labs.   Dr. Bebe Shaggy discussed case w/ on call cardiologist. Will give po kayexalate and d/c home.   11:17 PM: Pt continues to appear well on exam. Potassium of 5.5 here. I have discussed the diagnosis/risks/treatment options with the patient and believe the pt to be eligible for discharge home to follow-up with Dr. Antoine Poche in 2 days. We also discussed returning to the ED immediately if new or worsening sx occur. We discussed the sx which are most concerning (e.g., cp, sob) that necessitate immediate return. Any new prescriptions provided to the patient are listed below.  New Prescriptions   No  medications on file    Clinical Impression 1. Fatigue   2. Hyperkalemia            Purvis Sheffield, MD 08/15/11 2318

## 2011-08-15 NOTE — ED Notes (Signed)
Pt denies any pain or questions upon discharge, verbal understanding to follow up with PCP for recheck in labs.

## 2011-08-15 NOTE — Discharge Instructions (Signed)

## 2011-08-15 NOTE — ED Notes (Signed)
Instructed to come to ED d/t high potassium, 6.9 today at [facility]. Had a heart cath last week. Pt of Dr. Antoine Poche (card).  Mentions some general weakness. (denies: pain, sob, nvd, fever, cough, congestion, syncope, dizziness or other sx), pt here with wife. Pt alert, NAD, calm, interactive, skin W&D, resps e/u, speakingin clear complete sentences.

## 2011-08-15 NOTE — ED Provider Notes (Signed)
I have personally seen and examined the patient.  I have discussed the plan of care with the resident.  I have reviewed the documentation on PMH/FH/Soc. History.  I have reviewed the documentation of the resident and agree.  I have reviewed and agree with the ECG interpretation(s) documented by the resident.   Joya Gaskins, MD 08/15/11 [phone]

## 2011-08-18 ENCOUNTER — Encounter: Payer: Self-pay | Admitting: Nurse Practitioner

## 2011-08-18 ENCOUNTER — Ambulatory Visit (INDEPENDENT_AMBULATORY_CARE_PROVIDER_SITE_OTHER): Payer: Medicare Other | Admitting: Nurse Practitioner

## 2011-08-18 VITALS — BP 120/78 | HR 84 | Ht 71.0 in | Wt 177.0 lb

## 2011-08-18 DIAGNOSIS — I2589 Other forms of chronic ischemic heart disease: Secondary | ICD-10-CM

## 2011-08-18 DIAGNOSIS — I5022 Chronic systolic (congestive) heart failure: Secondary | ICD-10-CM

## 2011-08-18 DIAGNOSIS — E875 Hyperkalemia: Secondary | ICD-10-CM | POA: Insufficient documentation

## 2011-08-18 DIAGNOSIS — I255 Ischemic cardiomyopathy: Secondary | ICD-10-CM

## 2011-08-18 LAB — BASIC METABOLIC PANEL
BUN: 27 mg/dL — ABNORMAL HIGH (ref 6–23)
CO2: 27 mEq/L (ref 19–32)
Calcium: 9 mg/dL (ref 8.4–10.5)
Creatinine, Ser: 1 mg/dL (ref 0.4–1.5)
Glucose, Bld: 118 mg/dL — ABNORMAL HIGH (ref 70–99)

## 2011-08-18 NOTE — Telephone Encounter (Signed)
Pt treated in ED

## 2011-08-18 NOTE — Progress Notes (Signed)
Patient Name: Luke Pittman Date of Encounter: 08/18/2011  Primary Care Provider:  Rudi Heap, MD, MD Primary Cardiologist:  Shela Commons. Hochrein, MD  Patient Profile  66 y/o male with h/o ICM & CAD, who was recently hospitalized for CHF and later required ER visit for persistent hyperkalemia.  Problem List   Past Medical History  Diagnosis Date  . CAD (coronary artery disease)     a. s/p CABG 1994;  b.  Patent grafts 07/2011  . Chronic systolic CHF (congestive heart failure)     a. EF 25% in 2005;  b. Echo 07/2011: EF 25%  . Anemia   . NIDDM (non-insulin dependent diabetes mellitus)   . Hyperlipidemia   . Hyperkalemia     a. 07/2011 ->acei d/c'd.  . Ischemic cardiomyopathy    Past Surgical History  Procedure Date  . Coronary artery bypass graft     1994. LIMA to the LAD, sequential SVG to first and second obtuse marginal, sequential SVG to the right coronary artery.    Allergies  Allergies  Allergen Reactions  . Beta Adrenergic Blockers     Swollen hands  . Fish Oil Diarrhea  . Januvia (Sitagliptin Phosphate) Other (See Comments)    Leg cramps  . Tetanus-Diphtheria Toxoids Td Swelling    HPI  66 y/o male with the above problem list.  He was recently admitted to [facility] for CHF requiring diuresis.  Pt had mild elevation of potassium during that hospitalization with K+ of 4.8-4.9.  His creat rose to 1.2 on the day of d/c and arrangements were made for f/u bmet @ [facility] for 5/16->this came back showing a K+ of 6.4.  Our office attempted to contact the pt and messages were left as it was felt that pt would require kayexalate however, he never returned the calls.  F/U bmet on 5/17 revealed a K+ of 6.8.  Pt was contacted and advised to present to the ED for eval.  He was treated with kayexalate and his K+ was measured @ 5.5.  After discussion with the cardiology fellow on-call, pt was advised to stop his prinivil.  He presents today for f/u.  He says that following his d/c, he felt  sluggish but now he is feeling much better.  He denies c/p, pnd, orthopnea, n, v, dizziness, syncope, edema, or early satiety.  Home Medications  Prior to Admission medications   Medication Sig Start Date End Date Taking? Authorizing Provider  aspirin 325 MG EC tablet Take 325 mg by mouth daily.     Yes Historical Provider, MD  cholecalciferol (VITAMIN D) 1000 UNITS tablet Take 1,000 Units by mouth daily.   Yes Historical Provider, MD  ezetimibe (ZETIA) 10 MG tablet Take 10 mg by mouth daily.     Yes Historical Provider, MD  furosemide (LASIX) 20 MG tablet Take 20 mg by mouth daily. 08/06/11 08/05/12 Yes Dayna N Dunn, PA  glimepiride (AMARYL) 1 MG tablet Take 1 mg by mouth 2 (two) times daily as needed. If BG is over 120   Yes Historical Provider, MD  metFORMIN (GLUCOPHAGE) 500 MG tablet Take 500 mg by mouth as directed. Takes 1 tablet in the morning and 3 tablets in the evening 08/06/11  Yes Dayna N Dunn, PA  pravastatin (PRAVACHOL) 40 MG tablet Take 80 mg by mouth at bedtime.   Yes Historical Provider, MD    Review of Systems Felt sluggish last week but this has improved.  No chest pain, sob, n, v,  dizziness, syncope, edema, early satiety, dysuria, dark stools, blood in stools, diarrhea, rash/skin changes, fevers, chills, wt loss/gain.  Otherwise all systems reviewed and negative.  Physical Exam  Blood pressure 120/78, pulse 84, height 5\' 11"  (1.803 m), weight 177 lb (80.287 kg).  General: Pleasant, NAD Psych: Normal affect. Neuro: Alert and oriented X 3. Moves all extremities spontaneously. HEENT: Normal  Neck: Supple without bruits or JVD. Lungs:  Resp regular and unlabored, CTA. Heart: RRR no s3, s4, or murmurs. Abdomen: Soft, non-tender, non-distended, BS + x 4.  Extremities: No clubbing, cyanosis or edema. DP/PT/Radials 2+ and equal bilaterally.  Accessory Clinical Findings  bmet pending  Assessment & Plan  1.  Hyperkalemia:  Follow-up  bmet today.  ACEI was placed on hold after  ER visit 5/17.  If K+ improves, will consider re-initiation of ACEI in future, as he had been on this for 19 years.   2.  Chronic Systolic CHF/ICM:  S/p recent admission.  Volume looks good.  Cont low dose lasix.  He is not on bb 2/2 history of prior intolerance.  ACEI on hold in setting of #1.  Depending upon bmet drawn today, consider careful re-initiation of ACEI in the future.  He has f/u with Dr. Antoine Poche next week in [place].  We discussed the importance of daily weights and early symptom reporting, in an effort to prevent re-hospitalization.  Given persistently low EF, consider EP referral.  3.  CAD:  No chest pain.  Cont asa, zeita.  4.  DM:  On metformin.  Nicolasa Ducking, NP 08/18/2011, 11:55 AM

## 2011-08-18 NOTE — Patient Instructions (Signed)
Your physician recommends that you keep your follow-up appointment as scheduled with Dr Antoine Poche  Your physician recommends that you have lab work drawn today (BMP)

## 2011-08-22 ENCOUNTER — Telehealth: Payer: Self-pay | Admitting: Cardiology

## 2011-08-22 NOTE — Telephone Encounter (Signed)
Pt rtn call to sherri re lab work, pls call [phone]

## 2011-08-22 NOTE — Telephone Encounter (Signed)
Spoke with patient -- documented in lab result

## 2011-08-27 ENCOUNTER — Encounter: Payer: Self-pay | Admitting: Cardiology

## 2011-08-27 ENCOUNTER — Ambulatory Visit (INDEPENDENT_AMBULATORY_CARE_PROVIDER_SITE_OTHER): Payer: Medicare Other | Admitting: Cardiology

## 2011-08-27 VITALS — BP 100/70 | HR 95 | Ht 71.0 in | Wt 182.0 lb

## 2011-08-27 DIAGNOSIS — I2510 Atherosclerotic heart disease of native coronary artery without angina pectoris: Secondary | ICD-10-CM

## 2011-08-27 MED ORDER — LISINOPRIL 5 MG PO TABS: 5.0000 mg | ORAL_TABLET | Freq: Every day | ORAL | Status: DC

## 2011-08-27 MED ORDER — FUROSEMIDE 40 MG PO TABS: 40.0000 mg | ORAL_TABLET | Freq: Every day | ORAL | Status: DC

## 2011-08-27 NOTE — Progress Notes (Signed)
HPI The patient presents for follow up of CAD and ischemic cardiomyopathy.  He was hospitalized recently with worsening heart failure symptoms. Surprisingly his EF was unchanged and 25%. In addition bypass graft with patent. The reason for his sudden decline was not apparent. He was managed in the hospital. Post discharge he was found to have hyperkalemia treated with Kayexalate. Because of this his ACE inhibitor was stopped.  Unfortunately since that time he has continued to have fatigue and dyspnea. He does not have the exercise tolerance he had before. He's not describing any new PND or orthopnea. He has had no weight gain or edema. He's not having any chest pressure, neck or arm discomfort.  Allergies  Allergen Reactions  . Beta Adrenergic Blockers     Swollen hands  . Fish Oil Diarrhea  . Januvia (Sitagliptin Phosphate) Other (See Comments)    Leg cramps  . Tetanus-Diphtheria Toxoids Td Swelling    Current Outpatient Prescriptions  Medication Sig Dispense Refill  . aspirin 325 MG EC tablet Take 325 mg by mouth daily.        Marland Kitchen ezetimibe (ZETIA) 10 MG tablet Take 10 mg by mouth daily.        . furosemide (LASIX) 20 MG tablet Take 20 mg by mouth daily.      Marland Kitchen gabapentin (NEURONTIN) 100 MG capsule Take 100 mg by mouth as directed.      Marland Kitchen glimepiride (AMARYL) 1 MG tablet Take 1 mg by mouth 2 (two) times daily as needed. If BG is over 120      . metFORMIN (GLUCOPHAGE) 500 MG tablet Take 500 mg by mouth as directed. Takes 1 tablet in the morning and 3 tablets in the evening      . pravastatin (PRAVACHOL) 40 MG tablet Take 80 mg by mouth at bedtime.        Past Medical History  Diagnosis Date  . CAD (coronary artery disease)     a. s/p CABG 1994;  b.  Patent grafts 07/2011  . Chronic systolic CHF (congestive heart failure)     a. EF 25% in 2005;  b. Echo 07/2011: EF 25%  . Anemia   . NIDDM (non-insulin dependent diabetes mellitus)   . Hyperlipidemia   . Hyperkalemia     a. 07/2011  ->acei d/c'd.  . Ischemic cardiomyopathy     Past Surgical History  Procedure Date  . Coronary artery bypass graft     1994. LIMA to the LAD, sequential SVG to first and second obtuse marginal, sequential SVG to the right coronary artery.    ROS: As stated in the HPI and negative for all other systems.  PHYSICAL EXAM BP 100/70  Pulse 95  Ht 5\' 11"  (1.803 m)  Wt 182 lb (82.555 kg)  BMI 25.38 kg/m2 GENERAL:  Well appearing HEENT:  Pupils equal round and reactive, fundi not visualized, oral mucosa unremarkable NECK:  No jugular venous distention, waveform within normal limits, carotid upstroke brisk and symmetric, no bruits, no thyromegaly LYMPHATICS:  No cervical, inguinal adenopathy LUNGS:  Clear to auscultation bilaterally BACK:  No CVA tenderness CHEST:  Well healed sternotomy scar. HEART:  PMI not displaced or sustained,S1 and S2 within normal limits, positive S3, no S4, no clicks, no rubs, no murmurs ABD:  Flat, positive bowel sounds normal in frequency in pitch, no bruits, no rebound, no guarding, no midline pulsatile mass, no hepatomegaly, no splenomegaly EXT:  2 plus pulses throughout, no edema, no cyanosis no clubbing SKIN:  No rashes no nodules NEURO:  Cranial nerves II through XII grossly intact, motor grossly intact throughout PSYCH:  Cognitively intact, oriented to person place and time  EKG:   Normal sinus rhythm, rate 74, right axis deviation, premature ectopic complexes, poor anterior R wave progression, inferolateral T wave inversions all unchanged from previous.  08/27/2011  ASSESSMENT AND PLAN

## 2011-08-27 NOTE — Assessment & Plan Note (Signed)
The patient has worsening heart failure symptoms. Again the reason for this is not entirely clear. I am going to increase his Lasix to 40 mg daily. I would like him to restart his enal lisinopril at 2.5 mg.  Given the recent hyperkalemia I will need to watch his potassium closely. We had a long discussion about this today. He is an angry gentleman.  Over the years he has refused many studies and has wanted minimal care.  He is very upset about not feeling well now and having to be hospitalized. He is even angrier now about having  to go to the emergency room for treatment of his hyperkalemia.  We had a long discussion about this. He understands that his current decline is going to require more frequent appointments made changes in blood draws.  I am not sure that he has made up his mind to comply with this. He does agree to the current med changes and blood work. Of note we again discussed ICD but she steadfastly refuses. He understands the continued risk of sudden death

## 2011-08-27 NOTE — Assessment & Plan Note (Signed)
The patient has no new sypmtoms.  No further cardiovascular testing is indicated.  We will continue with aggressive risk reduction and meds as listed.  

## 2011-08-27 NOTE — Assessment & Plan Note (Signed)
As above.  I will watch this closely. 

## 2011-08-27 NOTE — Patient Instructions (Addendum)
Please increase Lasix to 40 mg a day  Start Lisinopril 5 mg a day  Continue all other medications as listed  have BMP in 5 days  Follow up in 2 weeks with Dr Antoine Poche

## 2011-08-29 ENCOUNTER — Telehealth: Payer: Self-pay | Admitting: Cardiology

## 2011-08-29 NOTE — Telephone Encounter (Signed)
Per Dr Antoine Poche - pt to decrease Lasix to 20 mg a day.  Stop lisinopril and call us when he returns from his trip.  He will need blood work then.  Left message for pt to call back.

## 2011-08-29 NOTE — Telephone Encounter (Signed)
Fu call °Patient returning your call °

## 2011-08-29 NOTE — Telephone Encounter (Signed)
Dr Antoine Poche aware.  He will review and give orders

## 2011-08-29 NOTE — Telephone Encounter (Signed)
Called pt back again - left message of instructions and to call MD on call if further questions or needs.

## 2011-08-29 NOTE — Telephone Encounter (Signed)
New problem:  Patient calling,  C/O side effect from lasix  40 mg. -  Leg cramps.

## 2011-09-09 ENCOUNTER — Telehealth: Payer: Self-pay | Admitting: Cardiology

## 2011-09-09 ENCOUNTER — Encounter: Payer: Self-pay | Admitting: Cardiology

## 2011-09-09 NOTE — Telephone Encounter (Signed)
Pt had labs done at [facility] and they will forward it to [employer]

## 2011-09-12 NOTE — Telephone Encounter (Signed)
Pt had BMP drawn 6/11 - his BUN 27 and his Crea 1.19. Will forward information to Dr Antoine Poche for further orders related to his medications.

## 2011-09-19 NOTE — Telephone Encounter (Signed)
I called the patient to talk to him.  He will increase his Lasix to 40 mg daily.  He is still SOB.  He will start back on lisinopril 2.5 mg.  He needs an order to get a BMET this week (Thursday) at Dr. Kathi Der office.

## 2011-09-24 ENCOUNTER — Ambulatory Visit (INDEPENDENT_AMBULATORY_CARE_PROVIDER_SITE_OTHER): Payer: Medicare Other | Admitting: Cardiology

## 2011-09-24 ENCOUNTER — Encounter: Payer: Self-pay | Admitting: Cardiology

## 2011-09-24 VITALS — BP 125/60 | HR 100 | Ht 71.0 in | Wt 180.0 lb

## 2011-09-24 DIAGNOSIS — I2510 Atherosclerotic heart disease of native coronary artery without angina pectoris: Secondary | ICD-10-CM

## 2011-09-24 DIAGNOSIS — I509 Heart failure, unspecified: Secondary | ICD-10-CM

## 2011-09-24 DIAGNOSIS — I5043 Acute on chronic combined systolic (congestive) and diastolic (congestive) heart failure: Secondary | ICD-10-CM

## 2011-09-24 MED ORDER — FUROSEMIDE 40 MG PO TABS: 40.0000 mg | ORAL_TABLET | Freq: Every day | ORAL | Status: DC

## 2011-09-24 MED ORDER — LISINOPRIL 5 MG PO TABS: 5.0000 mg | ORAL_TABLET | Freq: Every day | ORAL | Status: DC

## 2011-09-24 NOTE — Progress Notes (Signed)
HPI The patient presents for follow up of CAD and ischemic cardiomyopathy.  Since being discharged from the hospital he's had continued dyspnea. However, hyperkalemia necessitated be stopped ACE inhibitor. I called the patient to talk to him last week and increased his Lasix to 40 mg daily and started him  back on lisinopril 2.5 mg. He says that with this he is breathing much better. He's able to do more activities. He's not having any PND or orthopnea does not having any weight. He's not having any chest pressure, neck or arm discomfort. He has no weight gain or swelling.    Allergies  Allergen Reactions  . Beta Adrenergic Blockers     Swollen hands  . Fish Oil Diarrhea  . Januvia (Sitagliptin Phosphate) Other (See Comments)    Leg cramps  . Tetanus-Diphtheria Toxoids Td Swelling    Current Outpatient Prescriptions  Medication Sig Dispense Refill  . aspirin 325 MG EC tablet Take 325 mg by mouth daily.        Marland Kitchen ezetimibe (ZETIA) 10 MG tablet Take 10 mg by mouth daily.        . furosemide (LASIX) 40 MG tablet Take 1 tablet (40 mg total) by mouth daily.      Marland Kitchen gabapentin (NEURONTIN) 100 MG capsule Take 100 mg by mouth as directed.      Marland Kitchen glimepiride (AMARYL) 1 MG tablet Take 1 mg by mouth 2 (two) times daily as needed. If BG is over 120      . lisinopril (PRINIVIL,ZESTRIL) 5 MG tablet Take 5 mg by mouth daily. 2 1/2 tabs daily      . metFORMIN (GLUCOPHAGE) 500 MG tablet Take 500 mg by mouth as directed. Takes 1 tablet in the morning and 3 tablets in the evening      . pravastatin (PRAVACHOL) 40 MG tablet Take 80 mg by mouth at bedtime.        Past Medical History  Diagnosis Date  . CAD (coronary artery disease)     a. s/p CABG 1994;  b.  Patent grafts 07/2011  . Chronic systolic CHF (congestive heart failure)     a. EF 25% in 2005;  b. Echo 07/2011: EF 25%  . Anemia   . NIDDM (non-insulin dependent diabetes mellitus)   . Hyperlipidemia   . Hyperkalemia     a. 07/2011 ->acei  d/c'd.  . Ischemic cardiomyopathy     Past Surgical History  Procedure Date  . Coronary artery bypass graft     1994. LIMA to the LAD, sequential SVG to first and second obtuse marginal, sequential SVG to the right coronary artery.    ROS: As stated in the HPI and negative for all other systems.  PHYSICAL EXAM BP 125/60  Pulse 100  Ht 5\' 11"  (1.803 m)  Wt 180 lb (81.647 kg)  BMI 25.10 kg/m2 GENERAL:  Well appearing HEENT:  Pupils equal round and reactive, fundi not visualized, oral mucosa unremarkable NECK:  No jugular venous distention, waveform within normal limits, carotid upstroke brisk and symmetric, no bruits, no thyromegaly LYMPHATICS:  No cervical, inguinal adenopathy LUNGS:  Clear to auscultation bilaterally BACK:  No CVA tenderness CHEST:  Well healed sternotomy scar. HEART:  PMI not displaced or sustained,S1 and S2 within normal limits, positive S3, no S4, no clicks, no rubs, no murmurs ABD:  Flat, positive bowel sounds normal in frequency in pitch, no bruits, no rebound, no guarding, no midline pulsatile mass, no hepatomegaly, no splenomegaly EXT:  2 plus pulses throughout, no edema, no cyanosis no clubbing   ASSESSMENT AND PLAN

## 2011-09-24 NOTE — Telephone Encounter (Signed)
rx given to pt for lab at [facility]

## 2011-09-24 NOTE — Patient Instructions (Signed)
The current medical regimen is effective;  continue present plan and medications.  Please have blood work at [facility] today  Follow up in 6 weeks

## 2011-09-24 NOTE — Assessment & Plan Note (Signed)
Today check a basic metabolic profile. His potassium is okay and plan to increase his lisinopril 5 mg daily. I'll see him back in about 6 weeks for further followup.

## 2011-09-24 NOTE — Assessment & Plan Note (Signed)
The patient has no new sypmtoms.  No further cardiovascular testing is indicated.  We will continue with aggressive risk reduction and meds as listed.  

## 2011-10-21 ENCOUNTER — Encounter: Payer: Self-pay | Admitting: Cardiology

## 2011-11-05 ENCOUNTER — Ambulatory Visit (INDEPENDENT_AMBULATORY_CARE_PROVIDER_SITE_OTHER): Payer: Medicare Other | Admitting: Cardiology

## 2011-11-05 ENCOUNTER — Encounter: Payer: Self-pay | Admitting: Cardiology

## 2011-11-05 VITALS — BP 105/60 | HR 92 | Ht 71.0 in | Wt 180.0 lb

## 2011-11-05 DIAGNOSIS — E785 Hyperlipidemia, unspecified: Secondary | ICD-10-CM

## 2011-11-05 DIAGNOSIS — I255 Ischemic cardiomyopathy: Secondary | ICD-10-CM

## 2011-11-05 DIAGNOSIS — I2510 Atherosclerotic heart disease of native coronary artery without angina pectoris: Secondary | ICD-10-CM

## 2011-11-05 DIAGNOSIS — I2589 Other forms of chronic ischemic heart disease: Secondary | ICD-10-CM

## 2011-11-05 DIAGNOSIS — R5383 Other fatigue: Secondary | ICD-10-CM

## 2011-11-05 MED ORDER — FERROUS GLUCONATE 324 (38 FE) MG PO TABS: 324.0000 mg | ORAL_TABLET | Freq: Every day | ORAL | Status: DC

## 2011-11-05 NOTE — Patient Instructions (Addendum)
Please start Iron 324 mg a daily for three months Continue all other medications as listed   Follow up in 1 year with Dr Antoine Poche.  You will receive a letter in the mail 2 months before you are due.  Please call us when you receive this letter to schedule your follow up appointment.

## 2011-11-05 NOTE — Progress Notes (Signed)
HPI The patient presents for follow up of CAD and ischemic cardiomyopathy.  At the last visit I did increase his ACE inhibitor slightly. He tolerated this. He thinks his breathing is better than it was at the time of his acute hospitalization. However, is not at his previous baseline. He's not describing PND or orthopnea. He's fatigued more than he used to be. He denies any chest pressure, neck or arm discomfort. He's had no palpitations, presyncope or syncope. He has some mild lower extremity edema he denies any weight gain and his weight here in stable.    Allergies  Allergen Reactions  . Beta Adrenergic Blockers     Swollen hands  . Fish Oil Diarrhea  . Januvia (Sitagliptin Phosphate) Other (See Comments)    Leg cramps  . Tetanus-Diphtheria Toxoids Td Swelling    Current Outpatient Prescriptions  Medication Sig Dispense Refill  . aspirin 325 MG EC tablet Take 325 mg by mouth daily.        Marland Kitchen ezetimibe (ZETIA) 10 MG tablet Take 10 mg by mouth daily.        . furosemide (LASIX) 40 MG tablet Take 1 tablet (40 mg total) by mouth daily.  90 tablet  3  . gabapentin (NEURONTIN) 100 MG capsule Take 300 mg by mouth as directed.       Marland Kitchen glimepiride (AMARYL) 1 MG tablet Take 1 mg by mouth 2 (two) times daily as needed. If BG is over 120      . lisinopril (PRINIVIL,ZESTRIL) 5 MG tablet Take 2.5 mg by mouth daily.       . metFORMIN (GLUCOPHAGE) 500 MG tablet Take 500 mg by mouth as directed. Takes 1 tablet in the morning and 3 tablets in the evening      . pravastatin (PRAVACHOL) 40 MG tablet Take 80 mg by mouth at bedtime.        Past Medical History  Diagnosis Date  . CAD (coronary artery disease)     a. s/p CABG 1994;  b.  Patent grafts 07/2011  . Chronic systolic CHF (congestive heart failure)     a. EF 25% in 2005;  b. Echo 07/2011: EF 25%  . Anemia   . NIDDM (non-insulin dependent diabetes mellitus)   . Hyperlipidemia   . Hyperkalemia     a. 07/2011 ->acei d/c'd.  . Ischemic  cardiomyopathy     Past Surgical History  Procedure Date  . Coronary artery bypass graft     1994. LIMA to the LAD, sequential SVG to first and second obtuse marginal, sequential SVG to the right coronary artery.    ROS: As stated in the HPI and negative for all other systems.  PHYSICAL EXAM BP 105/60  Pulse 92  Ht 5\' 11"  (1.803 m)  Wt 180 lb (81.647 kg)  BMI 25.10 kg/m2 GENERAL:  Well appearing HEENT:  Pupils equal round and reactive, fundi not visualized, oral mucosa unremarkable NECK:  No jugular venous distention, waveform within normal limits, carotid upstroke brisk and symmetric, no bruits, no thyromegaly LYMPHATICS:  No cervical, inguinal adenopathy LUNGS:  Clear to auscultation bilaterally BACK:  No CVA tenderness CHEST:  Well healed sternotomy scar. HEART:  PMI not displaced or sustained,S1 and S2 within normal limits, positive S3, no S4, no clicks, no rubs, no murmurs ABD:  Flat, positive bowel sounds normal in frequency in pitch, no bruits, no rebound, no guarding, no midline pulsatile mass, no hepatomegaly, no splenomegaly EXT:  2 plus pulses throughout, no edema,  no cyanosis no clubbing   ASSESSMENT AND PLAN  Acute on chronic combined systolic and diastolic congestive heart failure -  He seems to be euvolemic. I'm going to continue his medicines as listed. No further imaging is indicated.   CAD (coronary artery disease) -  The patient has no new sypmtoms. No further cardiovascular testing is indicated. We will continue with aggressive risk reduction and meds as listed.   Fatigue -  He was anemic recently. I am going to give him he very month course of iron to see if this improves his symptoms.Marland Kitchen

## 2011-11-17 ENCOUNTER — Inpatient Hospital Stay (HOSPITAL_COMMUNITY)
Admission: EM | Admit: 2011-11-17 | Discharge: 2011-11-20 | DRG: 293 | Disposition: A | Discharge status: Home / Self Care | Payer: Medicare Other | Attending: Cardiology | Admitting: Cardiology

## 2011-11-17 ENCOUNTER — Encounter (HOSPITAL_COMMUNITY): Payer: Self-pay | Admitting: Emergency Medicine

## 2011-11-17 ENCOUNTER — Emergency Department (HOSPITAL_COMMUNITY): Payer: Medicare Other

## 2011-11-17 DIAGNOSIS — E1122 Type 2 diabetes mellitus with diabetic chronic kidney disease: Secondary | ICD-10-CM | POA: Diagnosis present

## 2011-11-17 DIAGNOSIS — E875 Hyperkalemia: Secondary | ICD-10-CM | POA: Diagnosis present

## 2011-11-17 DIAGNOSIS — E785 Hyperlipidemia, unspecified: Secondary | ICD-10-CM | POA: Diagnosis present

## 2011-11-17 DIAGNOSIS — I5023 Acute on chronic systolic (congestive) heart failure: Principal | ICD-10-CM | POA: Diagnosis present

## 2011-11-17 DIAGNOSIS — E1121 Type 2 diabetes mellitus with diabetic nephropathy: Secondary | ICD-10-CM | POA: Diagnosis present

## 2011-11-17 DIAGNOSIS — D509 Iron deficiency anemia, unspecified: Secondary | ICD-10-CM | POA: Diagnosis present

## 2011-11-17 DIAGNOSIS — Z951 Presence of aortocoronary bypass graft: Secondary | ICD-10-CM

## 2011-11-17 DIAGNOSIS — D649 Anemia, unspecified: Secondary | ICD-10-CM

## 2011-11-17 DIAGNOSIS — Z79899 Other long term (current) drug therapy: Secondary | ICD-10-CM

## 2011-11-17 DIAGNOSIS — I5021 Acute systolic (congestive) heart failure: Secondary | ICD-10-CM

## 2011-11-17 DIAGNOSIS — N289 Disorder of kidney and ureter, unspecified: Secondary | ICD-10-CM | POA: Diagnosis present

## 2011-11-17 DIAGNOSIS — Z7982 Long term (current) use of aspirin: Secondary | ICD-10-CM

## 2011-11-17 DIAGNOSIS — E119 Type 2 diabetes mellitus without complications: Secondary | ICD-10-CM | POA: Diagnosis present

## 2011-11-17 DIAGNOSIS — I2589 Other forms of chronic ischemic heart disease: Secondary | ICD-10-CM | POA: Diagnosis present

## 2011-11-17 DIAGNOSIS — I2510 Atherosclerotic heart disease of native coronary artery without angina pectoris: Secondary | ICD-10-CM | POA: Diagnosis present

## 2011-11-17 DIAGNOSIS — I509 Heart failure, unspecified: Secondary | ICD-10-CM | POA: Diagnosis present

## 2011-11-17 DIAGNOSIS — I255 Ischemic cardiomyopathy: Secondary | ICD-10-CM | POA: Diagnosis present

## 2011-11-17 DIAGNOSIS — R5383 Other fatigue: Secondary | ICD-10-CM

## 2011-11-17 LAB — BASIC METABOLIC PANEL WITH GFR
BUN: 36 mg/dL — ABNORMAL HIGH (ref 6–23)
CO2: 25 meq/L (ref 19–32)
Calcium: 9.3 mg/dL (ref 8.4–10.5)
Creatinine, Ser: 1.36 mg/dL — ABNORMAL HIGH (ref 0.50–1.35)
Glucose, Bld: 214 mg/dL — ABNORMAL HIGH (ref 70–99)

## 2011-11-17 LAB — CBC
HCT: 32.8 % — ABNORMAL LOW (ref 39.0–52.0)
HCT: 30.3 % — ABNORMAL LOW (ref 39.0–52.0)
Hemoglobin: 10.2 g/dL — ABNORMAL LOW (ref 13.0–17.0)
Hemoglobin: 9.6 g/dL — ABNORMAL LOW (ref 13.0–17.0)
MCH: 23.9 pg — ABNORMAL LOW (ref 26.0–34.0)
MCH: 24.1 pg — ABNORMAL LOW (ref 26.0–34.0)
MCHC: 31.1 g/dL (ref 30.0–36.0)
MCHC: 31.7 g/dL (ref 30.0–36.0)
MCV: 76.8 fL — ABNORMAL LOW (ref 78.0–100.0)
Platelets: 190 K/uL (ref 150–400)
RBC: 4.27 MIL/uL (ref 4.22–5.81)
RBC: 3.98 MIL/uL — ABNORMAL LOW (ref 4.22–5.81)
RDW: 16.6 % — ABNORMAL HIGH (ref 11.5–15.5)
WBC: 6.9 10*3/uL (ref 4.0–10.5)

## 2011-11-17 LAB — POCT I-STAT TROPONIN I: Troponin i, poc: 0.02 ng/mL (ref 0.00–0.08)

## 2011-11-17 LAB — BASIC METABOLIC PANEL
Chloride: 102 mEq/L (ref 96–112)
GFR calc Af Amer: 61 mL/min — ABNORMAL LOW (ref >90)
GFR calc non Af Amer: 53 mL/min — ABNORMAL LOW (ref >90)
Potassium: 5.2 mEq/L — ABNORMAL HIGH (ref 3.5–5.1)
Sodium: 137 mEq/L (ref 135–145)

## 2011-11-17 LAB — PRO B NATRIURETIC PEPTIDE: Pro B Natriuretic peptide (BNP): 3822 pg/mL — ABNORMAL HIGH (ref 0–125)

## 2011-11-17 MED ORDER — MORPHINE SULFATE 2 MG/ML IJ SOLN
2.0000 mg | Freq: Once | INTRAMUSCULAR | Status: AC
  Administered 2011-11-17: 2 mg via INTRAVENOUS
  Filled 2011-11-17: qty 1

## 2011-11-17 MED ORDER — SIMVASTATIN 20 MG PO TABS
20.0000 mg | ORAL_TABLET | Freq: Every day | ORAL | Status: DC
  Administered 2011-11-18 – 2011-11-19 (×2): 20 mg via ORAL
  Filled 2011-11-17 (×3): qty 1

## 2011-11-17 MED ORDER — FUROSEMIDE 10 MG/ML IJ SOLN
80.0000 mg | Freq: Once | INTRAMUSCULAR | Status: AC
  Administered 2011-11-17: 80 mg via INTRAVENOUS
  Filled 2011-11-17: qty 8

## 2011-11-17 MED ORDER — METFORMIN HCL 500 MG PO TABS
500.0000 mg | ORAL_TABLET | Freq: Every day | ORAL | Status: DC
  Administered 2011-11-18 – 2011-11-20 (×3): 500 mg via ORAL
  Filled 2011-11-17 (×4): qty 1

## 2011-11-17 MED ORDER — ASPIRIN EC 325 MG PO TBEC
325.0000 mg | DELAYED_RELEASE_TABLET | Freq: Every day | ORAL | Status: DC
  Administered 2011-11-18 – 2011-11-20 (×3): 325 mg via ORAL
  Filled 2011-11-17 (×3): qty 1

## 2011-11-17 MED ORDER — ENOXAPARIN SODIUM 40 MG/0.4ML ~~LOC~~ SOLN
40.0000 mg | SUBCUTANEOUS | Status: DC
  Administered 2011-11-17 – 2011-11-19 (×3): 40 mg via SUBCUTANEOUS
  Filled 2011-11-17 (×4): qty 0.4

## 2011-11-17 MED ORDER — LISINOPRIL 2.5 MG PO TABS
2.5000 mg | ORAL_TABLET | Freq: Every day | ORAL | Status: DC
  Administered 2011-11-19: 2.5 mg via ORAL
  Filled 2011-11-17 (×2): qty 1

## 2011-11-17 MED ORDER — GABAPENTIN 300 MG PO CAPS
300.0000 mg | ORAL_CAPSULE | Freq: Four times a day (QID) | ORAL | Status: DC
  Administered 2011-11-17 – 2011-11-20 (×9): 300 mg via ORAL
  Filled 2011-11-17 (×13): qty 1

## 2011-11-17 MED ORDER — EZETIMIBE 10 MG PO TABS
10.0000 mg | ORAL_TABLET | Freq: Every day | ORAL | Status: DC
  Administered 2011-11-18 – 2011-11-20 (×3): 10 mg via ORAL
  Filled 2011-11-17 (×3): qty 1

## 2011-11-17 MED ORDER — METFORMIN HCL 500 MG PO TABS
1500.0000 mg | ORAL_TABLET | Freq: Every day | ORAL | Status: DC
  Administered 2011-11-18 – 2011-11-19 (×2): 1500 mg via ORAL
  Filled 2011-11-17 (×3): qty 3

## 2011-11-17 MED ORDER — FUROSEMIDE 10 MG/ML IJ SOLN
6.0000 mg/h | INTRAVENOUS | Status: DC
  Administered 2011-11-17 – 2011-11-19 (×2): 6 mg/h via INTRAVENOUS
  Filled 2011-11-17 (×4): qty 25

## 2011-11-17 MED ORDER — FERROUS GLUCONATE 324 (38 FE) MG PO TABS
324.0000 mg | ORAL_TABLET | Freq: Every day | ORAL | Status: DC
  Administered 2011-11-18 – 2011-11-20 (×3): 324 mg via ORAL
  Filled 2011-11-17 (×4): qty 1

## 2011-11-17 MED ORDER — FUROSEMIDE 10 MG/ML IJ SOLN: 60.0000 mg | Freq: Four times a day (QID) | INTRAMUSCULAR | Status: DC

## 2011-11-17 NOTE — H&P (Addendum)
See addendum below   Patient ID: Luke Pittman MRN: [id number], DOB/AGE: [birth year]   Admit date: 11/17/2011   Primary Physician: Rudi Heap, MD Primary Cardiologist: [place]  Pt. Profile:  Patient presents with worsening edema.  Problem List  Past Medical History  Diagnosis Date  . CAD (coronary artery disease)     a. s/p CABG 1994;  b.  Patent grafts 07/2011  . Chronic systolic CHF (congestive heart failure)     a. EF 25% in 2005;  b. Echo 07/2011: EF 20-25%  . Anemia   . NIDDM (non-insulin dependent diabetes mellitus)   . Hyperlipidemia   . Hyperkalemia     a. 07/2011 ->acei d/c'd.  . Ischemic cardiomyopathy     Past Surgical History  Procedure Date  . Coronary artery bypass graft     1994. LIMA to the LAD, sequential SVG to first and second obtuse marginal, sequential SVG to the right coronary artery.     Allergies  Allergies  Allergen Reactions  . Beta Adrenergic Blockers     Swollen hands  . Fish Oil Diarrhea  . Januvia (Sitagliptin Phosphate) Other (See Comments)    Leg cramps  . Tetanus-Diphtheria Toxoids Td Swelling    HPI  This is a 66yo WM with history of ischemic cardiomyopathy (last LVEF 20-25%) who presents with a two day history of worsening swelling in his lower extremities and has gained 11 lbs.  He has some baseline dyspnea and able to walk about 100 ft without having to stop for dyspnea which has not worsened over the last 2 days.  He denies significant orthopnea/PND.  His diet has not changed and his meds have not changed recently.  He last saw Dr. Caprice Red a few weeks ago at which time he was at his baseline.  He also has been having some nausea over the last week.  Denies angina, presyncope/syncope, or palpitations.  He presents to the ER for further evaluation.  He has been given IV lasix by ER physician already for volume overload.  Home Medications  Prior to Admission medications   Medication Sig Start Date End Date Taking? Authorizing  Provider  aspirin 325 MG EC tablet Take 325 mg by mouth daily.     Yes Historical Provider, MD  ezetimibe (ZETIA) 10 MG tablet Take 10 mg by mouth daily.     Yes Historical Provider, MD  ferrous gluconate (FERGON) 324 MG tablet Take 1 tablet (324 mg total) by mouth daily with breakfast. 11/05/11 11/04/12 Yes Rollene Rotunda, MD  furosemide (LASIX) 40 MG tablet Take 1 tablet (40 mg total) by mouth daily. 09/24/11 09/23/12 Yes Rollene Rotunda, MD  gabapentin (NEURONTIN) 100 MG capsule Take 300 mg by mouth 4 (four) times daily.    Yes Historical Provider, MD  glimepiride (AMARYL) 1 MG tablet Take 0.5-1 mg by mouth 2 (two) times daily as needed. If BG is over 120   Yes Historical Provider, MD  lisinopril (PRINIVIL,ZESTRIL) 5 MG tablet Take 2.5 mg by mouth daily.  09/24/11 09/23/12 Yes Rollene Rotunda, MD  metFORMIN (GLUCOPHAGE) 500 MG tablet Take 500-1,500 mg by mouth as directed. Takes 1 tablet in the morning and 3 tablets in the evening 08/06/11  Yes Dayna N Dunn, PA  pravastatin (PRAVACHOL) 40 MG tablet Take 80 mg by mouth at bedtime.   Yes Historical Provider, MD    Family History  Family History  Problem Relation Age of Onset  . Heart failure Mother   . Heart attack Father 29  .  Heart attack Brother 66    Deceased at 9 from heart failure    Social History  History   Social History  . Marital Status: Married    Spouse Name: N/A    Number of Children: 2  . Years of Education: N/A   Occupational History  . Not on file.   Social History Main Topics  . Smoking status: Former Smoker    Types: Cigarettes    Quit date: 08/26/1961  . Smokeless tobacco: Never Used   Comment: quit 40 years ago  . Alcohol Use: No  . Drug Use: No  . Sexually Active: No   Other Topics Concern  . Not on file   Social History Narrative   Lives in [place], [place] with wife.      Review of Systems General:  No chills, fever, night sweats or weight changes.  Cardiovascular:  No chest pain, +dyspnea on exertion, +  edema,  No orthopnea, palpitations, paroxysmal nocturnal dyspnea. Dermatological: No rash, lesions/masses Respiratory: No cough, + dyspnea Urologic: No hematuria, dysuria Abdominal:   No nausea, vomiting, diarrhea, bright red blood per rectum, melena, or hematemesis Neurologic:  No visual changes, wkns, changes in mental status. All other systems reviewed and are otherwise negative except as noted above.  Physical Exam  Blood pressure 104/75, pulse 113, temperature 98.3 F (36.8 C), temperature source Oral, resp. rate 20, SpO2 100.00%.  General: Pleasant, NAD Psych: Normal affect. Neuro: Alert and oriented X 3. Moves all extremities spontaneously. HEENT: Normal  Neck: Supple without bruits.  JVP elevated to angle of jaw. Lungs:  Resp regular and unlabored, CTA. Heart: RRR no s3, s4, or murmurs. Abdomen: Soft, non-tender, non-distended, BS + x 4.  Extremities: No clubbing, cyanosis or edema. DP/PT/Radials 2+ and equal bilaterally.  Labs  No results found for this basename: CKTOTAL:4,CKMB:4,TROPONINI:4 in the last 72 hours Lab Results  Component Value Date   WBC 6.9 11/17/2011   HGB 10.2* 11/17/2011   HCT 32.8* 11/17/2011   MCV 76.8* 11/17/2011   PLT 190 11/17/2011    Lab 11/17/11 1823  NA 137  K 5.2*  CL 102  CO2 25  BUN 36*  CREATININE 1.36*  CALCIUM 9.3  PROT --  BILITOT --  ALKPHOS --  ALT --  AST --  GLUCOSE 214*   No results found for this basename: CHOL, HDL, LDLCALC, TRIG   No results found for this basename: DDIMER     Radiology/Studies  Dg Chest 2 View  11/17/2011  *RADIOLOGY REPORT*  Clinical Data: Shortness of breath, leg swelling, cough.  CHEST - 2 VIEW  Comparison: 08/15/2011  Findings: Previous CABG.  Mild cardiomegaly.  Small bilateral pleural effusions.  Mild central pulmonary vascular congestion without overt interstitial edema.  IMPRESSION:  1.  Cardiomegaly with new small bilateral pleural effusions and central pulmonary vascular congestion.    Original Report Authenticated By: Thora Lance III, M.D.     ECG  Sinus rhythm with occasional Premature ventricular complexes Nonspecific T wave abnormality  ASSESSMENT   1) Acute decompensation of systolic heart failure (LVEF 20-25% as of 07/2011) 2) CAD  3) Anemia 4) Diabetes 5) Mildly elevated K+  PLAN 1) Will admit for treatment of CHF with IV lasix (start 60mg  IV q6h and titrate as needed based on response), daily weights, low sodium diet, monitor i/o 2) DVT prophylaxis 3) Resume home meds as indicated 4) Follow electrolytes   Signed, Kirk Ruths, MD 11/17/2011, 9:19 PM  Please note correction to  above physical findings: Lungs: bibasilar fine crackles Ext: 2+ pitting edema b/l up to thighs

## 2011-11-17 NOTE — ED Notes (Signed)
Pt c/o of bilateral lower and abdominal edema that started over the weekend.  Pt reports weighing himself today and noticing a 11 pound weight gain.  Pt also reports SOB, palpitations and pain in his lower extremities 10/10.  Abdomen tender to touch and distended.  Pt has a cough present on inhalation that the pt reports having since May with it getting progressively worse in the last couple of weeks.  Nad.

## 2011-11-17 NOTE — ED Notes (Signed)
Pt here with 11lb weight gain since this weekend with swelling to LE and abd; pt sts SOB

## 2011-11-17 NOTE — ED Provider Notes (Signed)
History     CSN: [id number]  Arrival date & time 11/17/11  1814   First MD Initiated Contact with Patient 11/17/11 1927      Chief Complaint  Patient presents with  . Leg Swelling  . Shortness of Breath    (Consider location/radiation/quality/duration/timing/severity/associated sxs/prior treatment) Patient is a 66 y.o. male presenting with shortness of breath. The history is provided by the patient.  Shortness of Breath  The current episode started more than 2 weeks ago (chronic but progressively worse for 2 days). The onset was gradual. The problem occurs continuously. The problem has been gradually worsening. The problem is moderate. Nothing relieves the symptoms. The symptoms are aggravated by activity and a supine position. Associated symptoms include orthopnea, cough and shortness of breath. Pertinent negatives include no chest pain, no chest pressure, no fever and no wheezing. Associated symptoms comments: LE edema and abdominal swelling worse x2 days. Past medical history comments: CHF.    Past Medical History  Diagnosis Date  . CAD (coronary artery disease)     a. s/p CABG 1994;  b.  Patent grafts 07/2011  . Chronic systolic CHF (congestive heart failure)     a. EF 25% in 2005;  b. Echo 07/2011: EF 20-25%  . Anemia   . NIDDM (non-insulin dependent diabetes mellitus)   . Hyperlipidemia   . Hyperkalemia     a. 07/2011 ->acei d/c'd.  . Ischemic cardiomyopathy     Past Surgical History  Procedure Date  . Coronary artery bypass graft     1994. LIMA to the LAD, sequential SVG to first and second obtuse marginal, sequential SVG to the right coronary artery.    Family History  Problem Relation Age of Onset  . Heart failure Mother   . Heart attack Father 57  . Heart attack Brother 59    Deceased at 18 from heart failure    History  Substance Use Topics  . Smoking status: Former Smoker    Types: Cigarettes    Quit date: 08/26/1961  . Smokeless tobacco: Never Used   Comment: quit 40 years ago  . Alcohol Use: No      Review of Systems  Constitutional: Negative for fever and chills.  HENT: Negative.   Eyes: Negative.   Respiratory: Positive for cough and shortness of breath. Negative for wheezing.   Cardiovascular: Positive for orthopnea and leg swelling. Negative for chest pain and palpitations.  Gastrointestinal: Positive for abdominal distention. Negative for nausea, vomiting, diarrhea and constipation.  Genitourinary: Positive for scrotal swelling.  Musculoskeletal: Negative.   Skin: Negative.   Neurological: Negative.     Allergies  Beta adrenergic blockers; Fish oil; Januvia; and Tetanus-diphtheria toxoids td  Home Medications   No current outpatient prescriptions on file.  BP 103/67  Pulse 86  Temp 98.1 F (36.7 C) (Oral)  Resp 20  Ht 5\' 11"  (1.803 m)  Wt 183 lb 10.3 oz (83.3 kg)  BMI 25.61 kg/m2  SpO2 100%  Physical Exam  Nursing note and vitals reviewed. Constitutional: He is oriented to person, place, and time. He appears well-developed and well-nourished. No distress.  HENT:  Head: Normocephalic and atraumatic.  Eyes: Conjunctivae are normal.  Neck: Neck supple.  Cardiovascular: Normal rate, regular rhythm, normal heart sounds and intact distal pulses.   Pulmonary/Chest: Effort normal. He has decreased breath sounds in the right lower field and the left lower field. He has no wheezes. He has no rales.  Abdominal: Soft. He exhibits distension. There is  no tenderness.  Musculoskeletal: Normal range of motion. He exhibits edema (2+ pitting edema bilaterally).  Neurological: He is alert and oriented to person, place, and time.  Skin: Skin is warm and dry.    ED Course  Procedures (including critical care time)  Labs Reviewed  CBC - Abnormal; Notable for the following:    Hemoglobin 10.2 (*)     HCT 32.8 (*)     MCV 76.8 (*)     MCH 23.9 (*)     RDW 16.6 (*)     All other components within normal limits  BASIC  METABOLIC PANEL - Abnormal; Notable for the following:    Potassium 5.2 (*)     Glucose, Bld 214 (*)     BUN 36 (*)     Creatinine, Ser 1.36 (*)     GFR calc non Af Amer 53 (*)     GFR calc Af Amer 61 (*)     All other components within normal limits  PRO B NATRIURETIC PEPTIDE - Abnormal; Notable for the following:    Pro B Natriuretic peptide (BNP) 3822.0 (*)     All other components within normal limits  CBC - Abnormal; Notable for the following:    RBC 3.98 (*)     Hemoglobin 9.6 (*)     HCT 30.3 (*)     MCV 76.1 (*)     MCH 24.1 (*)     RDW 16.3 (*)     All other components within normal limits  CREATININE, SERUM - Abnormal; Notable for the following:    Creatinine, Ser 1.36 (*)     GFR calc non Af Amer 53 (*)     GFR calc Af Amer 61 (*)     All other components within normal limits  POCT I-STAT TROPONIN I  CBC  BASIC METABOLIC PANEL   Dg Chest 2 View  11/17/2011  *RADIOLOGY REPORT*  Clinical Data: Shortness of breath, leg swelling, cough.  CHEST - 2 VIEW  Comparison: 08/15/2011  Findings: Previous CABG.  Mild cardiomegaly.  Small bilateral pleural effusions.  Mild central pulmonary vascular congestion without overt interstitial edema.  IMPRESSION:  1.  Cardiomegaly with new small bilateral pleural effusions and central pulmonary vascular congestion.   Original Report Authenticated By: Osa Craver, M.D.      1. Fatigue       MDM  66 yo male with PMHx of CAD, systolic CHF, DM who presents with 2 days of acute worsening of chronic shortness of breath and worsening LE edema.  No chest pain or pressure.  AF, VSS, NAD.  Diminished breath sounds at the bases bilaterally.  2+ pitting edema to bilateral LE, scrotal swelling, and abdominal distension noted.  Presentation concerning for CHF exacerbation.  Troponin negative.  BNP elevated at 3822.  CXR with new bilateral pleural effusions.  Presentation consistent with CHF exacerbation.  Will give Lasix IV and consult  cardiology for admission.  Pt admitted to the care of Cardiology in stable condition.        Cherre Robins, MD 11/18/11 [phone]

## 2011-11-17 NOTE — ED Provider Notes (Signed)
I saw and evaluated the patient, reviewed the resident's note and I agree with the findings and plan.  Patient with remote history of coronary disease status post CABG. He developed a congestive heart failure episode in May for which she was admitted and since then has had general worsening symptoms of edema as well as dyspnea on exertion. He has been following with the [facility] cardiology, Dr.Hochrain. No CP, has been taking his usual 40 mg lasix PO.  Pt notes slight decrease in usual urine output.  Pt with 3+ pitting edema in both LE.  Bun and Ct are 36 and 1.36, troponin I is 0.02.  Discussed with cardiology to consult/admit.  IV lasix given.    ECG at time 18:18 shows sinus tachycardia with occasional PVC's, poor r wave progression, repol abn and LVH, similar to ECG from 08/15/2011.  Abn ECG, freq PVC's are new.     Impression: CHF exacerbation and fluid overload Peripheral edema  Gavin Pound. Oletta Lamas, MD 11/22/11 1610

## 2011-11-18 DIAGNOSIS — R5383 Other fatigue: Secondary | ICD-10-CM

## 2011-11-18 DIAGNOSIS — E1122 Type 2 diabetes mellitus with diabetic chronic kidney disease: Secondary | ICD-10-CM | POA: Diagnosis present

## 2011-11-18 DIAGNOSIS — I5023 Acute on chronic systolic (congestive) heart failure: Secondary | ICD-10-CM | POA: Diagnosis present

## 2011-11-18 DIAGNOSIS — I509 Heart failure, unspecified: Secondary | ICD-10-CM

## 2011-11-18 LAB — CBC
HCT: 32.3 % — ABNORMAL LOW (ref 39.0–52.0)
Hemoglobin: 10 g/dL — ABNORMAL LOW (ref 13.0–17.0)
RBC: 4.24 MIL/uL (ref 4.22–5.81)
WBC: 7.3 10*3/uL (ref 4.0–10.5)

## 2011-11-18 LAB — BASIC METABOLIC PANEL
BUN: 27 mg/dL — ABNORMAL HIGH (ref 6–23)
Chloride: 100 mEq/L (ref 96–112)
Glucose, Bld: 108 mg/dL — ABNORMAL HIGH (ref 70–99)
Potassium: 3.6 mEq/L (ref 3.5–5.1)

## 2011-11-18 LAB — GLUCOSE, CAPILLARY
Glucose-Capillary: 104 mg/dL — ABNORMAL HIGH (ref 70–99)
Glucose-Capillary: 100 mg/dL — ABNORMAL HIGH (ref 70–99)
Glucose-Capillary: 196 mg/dL — ABNORMAL HIGH (ref 70–99)

## 2011-11-18 LAB — CREATININE, SERUM: GFR calc non Af Amer: 53 mL/min — ABNORMAL LOW (ref >90)

## 2011-11-18 MED ORDER — INSULIN ASPART 100 UNIT/ML ~~LOC~~ SOLN
0.0000 [IU] | Freq: Three times a day (TID) | SUBCUTANEOUS | Status: DC
  Administered 2011-11-18: 3 [IU] via SUBCUTANEOUS
  Administered 2011-11-18: 2 [IU] via SUBCUTANEOUS
  Administered 2011-11-19: 1 [IU] via SUBCUTANEOUS

## 2011-11-18 MED ORDER — MORPHINE SULFATE 4 MG/ML IJ SOLN
4.0000 mg | Freq: Once | INTRAMUSCULAR | Status: AC
  Administered 2011-11-18: 4 mg via INTRAVENOUS
  Filled 2011-11-18: qty 1

## 2011-11-18 MED ORDER — INSULIN ASPART 100 UNIT/ML ~~LOC~~ SOLN: 0.0000 [IU] | Freq: Every day | SUBCUTANEOUS | Status: DC

## 2011-11-18 MED ORDER — POTASSIUM CHLORIDE CRYS ER 20 MEQ PO TBCR
40.0000 meq | EXTENDED_RELEASE_TABLET | Freq: Once | ORAL | Status: AC
  Administered 2011-11-18: 40 meq via ORAL
  Filled 2011-11-18: qty 2

## 2011-11-18 NOTE — Progress Notes (Signed)
Patient ID: Luke Pittman, male   DOB: 09-30-1945, 66 y.o.   MRN: [id number]   SUBJECTIVE:    Patient is feeling better but still has significant edema. He has had significant diuresis.He had some mild renal insufficiency on admission that is already improving. Also his potassium went from 5.2 down to 3.6 with his diuresis.   Filed Vitals:   11/17/11 2213 11/17/11 2344 11/18/11 0534 11/18/11 0943  BP:  103/67 108/68 99/61  Pulse:  86 89 65  Temp:  98.1 F (36.7 C) 98 F (36.7 C)   TempSrc:  Oral Oral   Resp:  20 20   Height:  5\' 11"  (1.803 m)    Weight:  183 lb 10.3 oz (83.3 kg) 177 lb 7.5 oz (80.5 kg)   SpO2: 100% 100% 100%     Intake/Output Summary (Last 24 hours) at 11/18/11 1008 Last data filed at 11/18/11 0910  Gross per 24 hour  Intake  396.7 ml  Output   3050 ml  Net -2653.3 ml    LABS: Basic Metabolic Panel:  Basename 11/18/11 0543 11/17/11 2320 11/17/11 1823  NA 138 -- 137  K 3.6 -- 5.2*  CL 100 -- 102  CO2 26 -- 25  GLUCOSE 108* -- 214*  BUN 27* -- 36*  CREATININE 1.15 1.36* --  CALCIUM 8.8 -- 9.3  MG -- -- --  PHOS -- -- --   Liver Function Tests: No results found for this basename: AST:2,ALT:2,ALKPHOS:2,BILITOT:2,PROT:2,ALBUMIN:2 in the last 72 hours No results found for this basename: LIPASE:2,AMYLASE:2 in the last 72 hours CBC:  Basename 11/18/11 0543 11/17/11 2320  WBC 7.3 6.8  NEUTROABS -- --  HGB 10.0* 9.6*  HCT 32.3* 30.3*  MCV 76.2* 76.1*  PLT 202 183   Cardiac Enzymes: No results found for this basename: CKTOTAL:3,CKMB:3,CKMBINDEX:3,TROPONINI:3 in the last 72 hours BNP: No components found with this basename: POCBNP:3 D-Dimer: No results found for this basename: DDIMER:2 in the last 72 hours Hemoglobin A1C: No results found for this basename: HGBA1C in the last 72 hours Fasting Lipid Panel: No results found for this basename: CHOL,HDL,LDLCALC,TRIG,CHOLHDL,LDLDIRECT in the last 72 hours Thyroid Function Tests: No results found for  this basename: TSH,T4TOTAL,FREET3,T3FREE,THYROIDAB in the last 72 hours  RADIOLOGY: Dg Chest 2 View  11/17/2011  *RADIOLOGY REPORT*  Clinical Data: Shortness of breath, leg swelling, cough.  CHEST - 2 VIEW  Comparison: 08/15/2011  Findings: Previous CABG.  Mild cardiomegaly.  Small bilateral pleural effusions.  Mild central pulmonary vascular congestion without overt interstitial edema.  IMPRESSION:  1.  Cardiomegaly with new small bilateral pleural effusions and central pulmonary vascular congestion.   Original Report Authenticated By: Osa Craver, M.D.     PHYSICAL EXAM   Patient is oriented to person time and place. Affect is normal. Lungs reveal basilar rales. There is mild jugular venous distention. Cardiac exam reveals S1 and S2. There is no significant murmur. The abdomen is soft. The patient still has 2+ peripheral edema.   TELEMETRY:  I have reviewed telemetry today November 18, 2011. There is sinus rhythm. There are scattered PVCs.   ASSESSMENT AND PLAN:  Active Problems:   CAD (coronary artery disease)    Coronary disease is stable.   Hyperkalemia   Over time the patient has had some hyperkalemia. His potassium on admission was 5.2. With diuresis his potassium is now down to 3.6. I will supplement his potassium today and check labs again tomorrow.   Acute on chronic systolic CHF (  congestive heart failure)     This is the primary diagnosis. The patient had just been seen by Dr. Antoine Poche in the past few weeks. He has been stable. He cannot relate any particular reason why he developed recurrent CHF in the past weeks. Diuresis will be continued.He has been on a small dose of lisinopril at 5 mg at home. This was started at 2.5 mg for today. If his renal function is stable we will re\re increase it back up to 5 mg daily.   Renal insufficiency    There was a mild rise in creatinine on admission. His creatinine has already come down with his diuresis.  Willa Rough 11/18/2011  10:08 AM

## 2011-11-18 NOTE — Progress Notes (Signed)
Nutrition Brief Note  Patient identified on the Nutrition Risk Report for unintentional weight loss. Pt reports that he has been gaining weight, denies any weight loss. Denies any diet education needs.    Body mass index is 24.75 kg/(m^2). Pt meets criteria for WNL based on current BMI.   Current diet order is Heart, patient is consuming approximately 100% of meals at this time. Labs and medications reviewed.   No nutrition interventions warranted at this time. If nutrition issues arise, please consult RD.   Clarene Duke RD, LDN Pager [phone] After Hours pager [phone]

## 2011-11-18 NOTE — Progress Notes (Signed)
Patient is currently active with long-term disease management services with [facility] Care Management Program as a benefit of his [facility] Medicare. Mr Luke Pittman has been followed by the [facility] Nurse Practitioner. Once he is discharged from the hospital he will receive a post discharge transition of care call and home visit. [facility] Care Management services do not interfere or replace the services that are arranged by inpatient Case Management. Will continue to follow patient as he remains in house.    Raiford Noble, MSN- Ed, RN,BSN [facility] [place] Liaison [phone]

## 2011-11-18 NOTE — Progress Notes (Signed)
Utilization review completed.  

## 2011-11-19 DIAGNOSIS — D649 Anemia, unspecified: Secondary | ICD-10-CM

## 2011-11-19 LAB — BASIC METABOLIC PANEL
BUN: 27 mg/dL — ABNORMAL HIGH (ref 6–23)
Calcium: 9.6 mg/dL (ref 8.4–10.5)
GFR calc Af Amer: 63 mL/min — ABNORMAL LOW (ref >90)
GFR calc non Af Amer: 55 mL/min — ABNORMAL LOW (ref >90)
Potassium: 4.4 mEq/L (ref 3.5–5.1)
Sodium: 138 mEq/L (ref 135–145)

## 2011-11-19 LAB — OCCULT BLOOD X 1 CARD TO LAB, STOOL: Fecal Occult Bld: NEGATIVE

## 2011-11-19 LAB — GLUCOSE, CAPILLARY
Glucose-Capillary: 121 mg/dL — ABNORMAL HIGH (ref 70–99)
Glucose-Capillary: 107 mg/dL — ABNORMAL HIGH (ref 70–99)
Glucose-Capillary: 158 mg/dL — ABNORMAL HIGH (ref 70–99)

## 2011-11-19 MED ORDER — MORPHINE SULFATE 4 MG/ML IJ SOLN
INTRAMUSCULAR | Status: AC
  Administered 2011-11-19: 01:00:00
  Filled 2011-11-19: qty 1

## 2011-11-19 MED ORDER — MORPHINE SULFATE 4 MG/ML IJ SOLN
4.0000 mg | Freq: Once | INTRAMUSCULAR | Status: AC
Start: 2011-11-19 — End: 2011-11-19
  Administered 2011-11-19: 4 mg via INTRAVENOUS

## 2011-11-19 NOTE — Progress Notes (Signed)
Subjective:  SOB resolved.  Much improved.  On IV furosemide for 48 hours, and much improved.  Long discussion with patient regarding CBC findings.  Notably, he has a marked decrease in [facility] and MCV suggestive of iron deficiency.  He says that Dr. Christell Constant has tried to get him to have colonoscopy, and he has declined.  He also said Dr. Rexene Edison has discussed ICD with him.  He was drinking water prior to admission.    Objective:  Vital Signs in the last 24 hours: Temp:  [97.7 F (36.5 C)-98.1 F (36.7 C)] 97.9 F (36.6 C) (08/21 1401) Pulse Rate:  [82-100] 82  (08/21 1401) Resp:  [20] 20  (08/21 1401) BP: (88-112)/(52-70) 88/52 mmHg (08/21 1401) SpO2:  [97 %-98 %] 98 % (08/21 1401) Weight:  [173 lb (78.472 kg)] 173 lb (78.472 kg) (08/21 0457)  Intake/Output from previous day: 08/20 0701 - 08/21 0700 In: 758 [P.O.:680; I.V.:78] Out: 4300 [Urine:4300]   Physical Exam: General: Well developed, well nourished, in no acute distress. Head:  Normocephalic and atraumatic. Lungs: Clear to auscultation and percussion. Heart: Normal S1 and S2.  S4 gallop Pulses: Pulses normal in all 4 extremities. Extremities: No clubbing or cyanosis. No edema. Neurologic: Alert and oriented x 3. Rectal:  Performed by me.  Heme negative by me.  Also specimen sent to lab.  No masses felt with digital exam.      Lab Results:  Basename 11/18/11 0543 11/17/11 2320  WBC 7.3 6.8  HGB 10.0* 9.6*  PLT 202 183    Basename 11/19/11 0518 11/18/11 0543  NA 138 138  K 4.4 3.6  CL 96 100  CO2 34* 26  GLUCOSE 114* 108*  BUN 27* 27*  CREATININE 1.32 1.15   No results found for this basename: TROPONINI:2,CK,MB:2 in the last 72 hours Hepatic Function Panel No results found for this basename: PROT,ALBUMIN,AST,ALT,ALKPHOS,BILITOT,BILIDIR,IBILI in the last 72 hours No results found for this basename: CHOL in the last 72 hours No results found for this basename: PROTIME in the last 72 hours  Imaging: No results  found.    Assessment/Plan:  Patient Active Hospital Problem List: CAD (coronary artery disease) ()   Assessment: prior CABG with patent grafts   Plan: reduce ASA to 81 mg Hyperkalemia ()   Assessment: better today   Plan: monitor Acute on chronic systolic CHF (congestive heart failure) (11/18/2011)   Assessment: weight down and improved   Plan: plan dc in am.  Renal insufficiency (11/18/2011)   Assessment: very mild    Plan: monitor Microcytic anemia.   Check iron levels and recheck Hb.   Should have colon.  Will continue iron, and defer to Dr. Christell Constant.        Shawnie Pons, MD, [facility], FSCAI 11/19/2011, 8:20 PM

## 2011-11-20 ENCOUNTER — Telehealth: Payer: Self-pay | Admitting: *Deleted

## 2011-11-20 DIAGNOSIS — D509 Iron deficiency anemia, unspecified: Secondary | ICD-10-CM

## 2011-11-20 LAB — PRO B NATRIURETIC PEPTIDE: Pro B Natriuretic peptide (BNP): 1832 pg/mL — ABNORMAL HIGH (ref 0–125)

## 2011-11-20 LAB — BASIC METABOLIC PANEL
Calcium: 9.4 mg/dL (ref 8.4–10.5)
GFR calc Af Amer: 77 mL/min — ABNORMAL LOW (ref >90)
GFR calc non Af Amer: 67 mL/min — ABNORMAL LOW (ref >90)
Glucose, Bld: 90 mg/dL (ref 70–99)
Potassium: 3.9 mEq/L (ref 3.5–5.1)
Sodium: 135 mEq/L (ref 135–145)

## 2011-11-20 LAB — CBC
Hemoglobin: 10 g/dL — ABNORMAL LOW (ref 13.0–17.0)
MCHC: 31.6 g/dL (ref 30.0–36.0)
Platelets: 192 10*3/uL (ref 150–400)
RBC: 4.13 MIL/uL — ABNORMAL LOW (ref 4.22–5.81)

## 2011-11-20 LAB — GLUCOSE, CAPILLARY: Glucose-Capillary: 92 mg/dL (ref 70–99)

## 2011-11-20 MED ORDER — POTASSIUM CHLORIDE ER 10 MEQ PO TBCR: 10.0000 meq | EXTENDED_RELEASE_TABLET | Freq: Every day | ORAL | Status: DC

## 2011-11-20 MED ORDER — FUROSEMIDE 80 MG PO TABS
80.0000 mg | ORAL_TABLET | Freq: Every day | ORAL | Status: AC
  Administered 2011-11-20: 80 mg via ORAL
  Filled 2011-11-20: qty 1

## 2011-11-20 MED ORDER — FUROSEMIDE 40 MG PO TABS: ORAL_TABLET | ORAL | Status: DC

## 2011-11-20 MED ORDER — SENNA-DOCUSATE SODIUM 8.6-50 MG PO TABS: 1.0000 | ORAL_TABLET | Freq: Every day | ORAL | Status: DC

## 2011-11-20 NOTE — Progress Notes (Signed)
Patient discharged home with all belongings and verbalizes understanding of discharge instructions. Anastasia Fiedler RN

## 2011-11-20 NOTE — Telephone Encounter (Signed)
We need to have somebody call and check on him before his next appt as part of the transition of care rules. Per Dr Antoine Poche.  Please call pt and follow up with how he is doing.  His follow up appt is Wednesday 8/28 in [place].

## 2011-11-20 NOTE — Discharge Summary (Signed)
Discharge Summary   Patient ID: Luke Pittman,  MRN: [id number], DOB/AGE: [birth year] 66 y.o.  Admit date: 11/17/2011 Discharge date: 11/20/2011  Primary Physician: Rudi Heap, MD Primary Cardiologist: Rollene Rotunda, MD  Discharge Diagnoses Principal Problem:  *Acute on chronic systolic CHF (congestive heart failure) Active Problems:  DM  DYSLIPIDEMIA  CAD  Hyperkalemia  Ischemic cardiomyopathy  Renal insufficiency  Microcytic anemia   Allergies Allergies  Allergen Reactions  . Beta Adrenergic Blockers     Swollen hands  . Fish Oil Diarrhea  . Januvia (Sitagliptin Phosphate) Other (See Comments)    Leg cramps  . Tetanus-Diphtheria Toxoids Td Swelling    Diagnostic Studies/Procedures  CHEST X-RAY  - 11/17/11  Comparison: 08/15/2011  Findings: Previous CABG. Mild cardiomegaly. Small bilateral  pleural effusions. Mild central pulmonary vascular congestion  without overt interstitial edema.  IMPRESSION:  1. Cardiomegaly with new small bilateral pleural effusions and  central pulmonary vascular congestion.  History of Present Illness  Luke Pittman is a 66yo male with the above problem list who was admitted to [facility] on 11/17/11 with acute on chronic systolic CHF.   He reported a two-day history of worsening bilateral lower extremity swelling, and weight gain of 11lbs. He did endorse baseline dyspnea, but was no worse during this period. He denied orthopnea and PND. He denied dietary changes and endorsed medication compliance. No recent med changes. He denied angina, presyncope/syncope or palpitations. He had seen Dr. Antoine Poche the week prior at which time he was at his baseline. He presented to [facility] ED. There, EKG revealed no acute ischemia changes. Trop-I was WNL. pBNP was elevated at 3822.0. CXR as above revealed cardiomegaly, new small bilateral pleural effusions and central pulmonary vascular congestion. CBC did reveal a new microcytic anemia (H/H  10.2/32.8). BMET revealed a mild hyperkalemia (K 5.2) and a mild acute renal insufficiency (Cr 1.36). He was given Lasix IV in the ED with appropriate output and improvement in symptoms. Cardiology was consulted, and admitted the patient for further management of acute on chronic systolic CHF.   Hospital Course   He was continued on Lasix IV with output monitoring (I/Os and daily weights). Lisinopril was held in the setting of his hyperkalemia. He was placed on a low-sodium diet. The following morning, the patient's BMET revealed low-normalization of his potassium and improvement in his mild renal insufficiency. This was believed to be secondary to hypervolemia. Potassium was supplemented. Further evaluation by the rounding MD the following day revealed no clear instigating factor for the patient's decompensation, however later in the admission, he did note that he had not followed a fluid restriction. There was low suspicion of cardiac ischemia driving the patient's acute CHF exacerbation. Additionally, there were no acute ischemic events or endorsement of ischemic symptoms throughout the admission. Lisinopril was re-initiated at a low-dose. [facility] was consulted and made transitional care arrangements. His symptoms continued to improve with diuresis.   Regarding his anemia, his PCP has attempted to have the patient undergo a colonoscopy, but the patient has declined. He had been taking iron supplementation outpatient. This was continued on admission. FOBT was ordered and returned negative.   He was assessed by Dr. Antoine Poche today and found to be stable for discharge. His symptoms are much improved today. Admission weight: 183 lbs. Discharge weight: 170 lbs. Total I/O: - 7750 cc. Electrolytes and renal function remained stable. He will have BMET scheduled for Monday (11/24/11) to reassess electrolytes and renal function. He will be instructed take  Lasix 80mg  daily x 3 days, then resume his usual regimen.  Potassium will be supplemented. He has been instructed to adhere to fluid restriction and continue to document daily weights in addition to maintaining sodium restriction and medication compliance. He will follow-up with Dr. Antoine Poche in </= 7 days at the [facility] office given his CHF admission. Of note, the patient's EF is 20-25%. The prospect of ICD has been discussed with the patient by Luke Pittman, and will be re-addressed on follow-up. He has an allergy to beta blockers. ACEi will be resumed. He has been encouraged to follow-up with his PCP to pursue a colonoscopy. He will be provided with a stool softener on discharge and advised to continue iron supplementation. This information, including supplemental heart failure material, has been clearly outlined in the discharge AVS.   Discharge Vitals:  Blood pressure 108/58, pulse 101, temperature 97.6 F (36.4 C), temperature source Oral, resp. rate 18, height 5\' 11"  (1.803 m), weight 77.202 kg (170 lb 3.2 oz), SpO2 99.00%.   Labs: Recent Labs  [facility] 11/20/11 0525 11/18/11 0543   WBC 6.9 7.3   HGB 10.0* 10.0*   HCT 31.6* 32.3*   MCV 76.5* 76.2*   PLT 192 202    Lab 11/20/11 0525 11/19/11 0518 11/18/11 0543  NA 135 138 138  K 3.9 4.4 3.6  CL 94* 96 100  CO2 29 34* 26  BUN 26* 27* 27*  CREATININE 1.12 1.32 1.15  CALCIUM 9.4 9.6 8.8  PROT -- -- --  BILITOT -- -- --  ALKPHOS -- -- --  ALT -- -- --  AST -- -- --  AMYLASE -- -- --  LIPASE -- -- --  GLUCOSE 90 114* 108*   Disposition:  Discharge Orders    Future Appointments: Provider: Department: Dept Phone: Center:   11/26/2011 3:30 PM Rollene Rotunda, MD [facility] [phone] [facility]     Discharge Medications:  Medication List  As of 11/20/2011 10:29 AM   START taking these medications         potassium chloride 10 MEQ tablet   Commonly known as: K-DUR   Take 1 tablet (10 mEq total) by mouth daily.      sennosides-docusate sodium 8.6-50 MG tablet   Commonly known  as: SENOKOT-S   Take 1 tablet by mouth daily.         CHANGE how you take these medications         furosemide 40 MG tablet   Commonly known as: LASIX   Take two tablets (80mg ) daily for 3 days (until 11/23/11), then resume taking one tablet (40mg ) daily.   What changed: - dose - route (how to take the med) - how often to take the med - doctor's instructions         CONTINUE taking these medications         aspirin 325 MG EC tablet      ezetimibe 10 MG tablet   Commonly known as: ZETIA      ferrous gluconate 324 MG tablet   Commonly known as: FERGON   Take 1 tablet (324 mg total) by mouth daily with breakfast.      gabapentin 100 MG capsule   Commonly known as: NEURONTIN      glimepiride 1 MG tablet   Commonly known as: AMARYL      lisinopril 5 MG tablet   Commonly known as: PRINIVIL,ZESTRIL      metFORMIN 500 MG tablet   Commonly known as:  GLUCOPHAGE      pravastatin 40 MG tablet   Commonly known as: PRAVACHOL          Where to get your medications    These are the prescriptions that you need to pick up. We sent them to a specific pharmacy, so you will need to go there to get them.   [facility] - [place] - [address]    [address] [place] [address]    Phone: [phone]        furosemide 40 MG tablet   potassium chloride 10 MEQ tablet   sennosides-docusate sodium 8.6-50 MG tablet           Outstanding Labs/Studies: BMET on 11/24/11 at [facility]  Duration of Discharge Encounter: Greater than 30 minutes including physician time.  Signed, R. Hurman Horn, PA-C 11/20/2011, 10:29 AM  Patient seen and examined.  Plan as discussed in my rounding note for today and outlined above. Rollene Rotunda  11/20/2011  5:19 PM

## 2011-11-20 NOTE — Progress Notes (Signed)
SUBJECTIVE:  Breathing better.  Sore legs last night.  Didn't sleep well   PHYSICAL EXAM Filed Vitals:   11/19/11 0951 11/19/11 1401 11/19/11 2043 11/20/11 0546  BP: 104/70 88/52 105/63 106/65  Pulse:  82 99 96  Temp:  97.9 F (36.6 C) 97.4 F (36.3 C) 97.6 F (36.4 C)  TempSrc:  Oral Oral Oral  Resp:  20 20 20   Height:      Weight:    170 lb 3.2 oz (77.202 kg)  SpO2:  98% 95% 99%   General:  No distress Lungs:  Clear Heart:  RRR Abdomen:  Positive bowel sounds, no rebound no guardin Extremities:  Moderate edema  LABS:  Results for orders placed during the hospital encounter of 11/17/11 (from the past 24 hour(s))  GLUCOSE, CAPILLARY     Status: Abnormal   Collection Time   11/19/11 11:16 AM      Component Value Range   Glucose-Capillary 150 (*) 70 - 99 mg/dL   Comment 1 Documented in Chart     Comment 2 Notify RN    GLUCOSE, CAPILLARY     Status: Abnormal   Collection Time   11/19/11  4:21 PM      Component Value Range   Glucose-Capillary 107 (*) 70 - 99 mg/dL   Comment 1 Notify RN    OCCULT BLOOD X 1 CARD TO LAB, STOOL     Status: Normal   Collection Time   11/19/11  8:32 PM      Component Value Range   Fecal Occult Bld NEGATIVE    GLUCOSE, CAPILLARY     Status: Abnormal   Collection Time   11/19/11 11:12 PM      Component Value Range   Glucose-Capillary 158 (*) 70 - 99 mg/dL  CBC     Status: Abnormal   Collection Time   11/20/11  5:25 AM      Component Value Range   WBC 6.9  4.0 - 10.5 K/uL   RBC 4.13 (*) 4.22 - 5.81 MIL/uL   Hemoglobin 10.0 (*) 13.0 - 17.0 g/dL   HCT 81.1 (*) 91.4 - 78.2 %   MCV 76.5 (*) 78.0 - 100.0 fL   MCH 24.2 (*) 26.0 - 34.0 pg   MCHC 31.6  30.0 - 36.0 g/dL   RDW 95.6 (*) 21.3 - 08.6 %   Platelets 192  150 - 400 K/uL  BASIC METABOLIC PANEL     Status: Abnormal   Collection Time   11/20/11  5:25 AM      Component Value Range   Sodium 135  135 - 145 mEq/L   Potassium 3.9  3.5 - 5.1 mEq/L   Chloride 94 (*) 96 - 112 mEq/L   CO2  29  19 - 32 mEq/L   Glucose, Bld 90  70 - 99 mg/dL   BUN 26 (*) 6 - 23 mg/dL   Creatinine, Ser 5.78  0.50 - 1.35 mg/dL   Calcium 9.4  8.4 - 46.9 mg/dL   GFR calc non Af Amer 67 (*) >90 mL/min   GFR calc Af Amer 77 (*) >90 mL/min  PRO B NATRIURETIC PEPTIDE     Status: Abnormal   Collection Time   11/20/11  5:25 AM      Component Value Range   Pro B Natriuretic peptide (BNP) 1832.0 (*) 0 - 125 pg/mL  GLUCOSE, CAPILLARY     Status: Normal   Collection Time   11/20/11  5:48 AM  Component Value Range   Glucose-Capillary 92  70 - 99 mg/dL    Intake/Output Summary (Last 24 hours) at 11/20/11 5409 Last data filed at 11/20/11 0818  Gross per 24 hour  Intake    960 ml  Output   3275 ml  Net  -2315 ml     ASSESSMENT AND PLAN:  CAD (coronary artery disease)  Stable.  No change in therapy.  Hyperkalemia  Corrected  Acute on chronic systolic CHF (congestive heart failure):  Weight down 13 lbs since admission.  Breathing better.  We discussed fluid restriction and daily weights.  He can go home with 40 bid of lasix for three days.  Needs a BMET on Monday.  I will need to see him within 7 days [facility] [place])   Renal insufficiency  As above   Anemia He is refusing colonoscopy at this time.  Rollene Rotunda 11/20/2011 8:32 AM

## 2011-11-21 ENCOUNTER — Telehealth: Payer: Self-pay | Admitting: *Deleted

## 2011-11-21 NOTE — Telephone Encounter (Signed)
Spoke with Luke Pittman who states he is doing well since being home--no c.o chest pain ,no c/o SOB or fluid retention--states his only problem is he can't sleep at night--advised to call us if he has any concerns about his condition

## 2011-11-21 NOTE — Telephone Encounter (Signed)
Error--nt 

## 2011-11-21 NOTE — Telephone Encounter (Signed)
LMTCB

## 2011-11-26 ENCOUNTER — Encounter: Payer: Self-pay | Admitting: Cardiology

## 2011-11-26 ENCOUNTER — Ambulatory Visit (INDEPENDENT_AMBULATORY_CARE_PROVIDER_SITE_OTHER): Payer: Medicare Other | Admitting: Cardiology

## 2011-11-26 VITALS — BP 115/60 | HR 62 | Ht 71.0 in | Wt 178.0 lb

## 2011-11-26 DIAGNOSIS — E785 Hyperlipidemia, unspecified: Secondary | ICD-10-CM

## 2011-11-26 DIAGNOSIS — I255 Ischemic cardiomyopathy: Secondary | ICD-10-CM

## 2011-11-26 DIAGNOSIS — I2510 Atherosclerotic heart disease of native coronary artery without angina pectoris: Secondary | ICD-10-CM

## 2011-11-26 DIAGNOSIS — I5023 Acute on chronic systolic (congestive) heart failure: Secondary | ICD-10-CM

## 2011-11-26 MED ORDER — CARVEDILOL 3.125 MG PO TABS: 3.1250 mg | ORAL_TABLET | Freq: Every day | ORAL | Status: DC

## 2011-11-26 NOTE — Progress Notes (Signed)
HPI The patient presents for follow up of CAD and ischemic cardiomyopathy.  He was hospitalized a few days ago with an acute exacerbation of hischronic systolic heart failure. He is back for a transition of care appointment. We did call him following discharge and he had no acute complaints. He's been weighing himself every day now. His weights have not increased by 2 pounds in any given day though they are up slightly over the last 5 days total. He thinks his leg swelling is much improved compared with the time of admission as is his breathing. He's not having any PND or orthopnea. He's not having any palpitations, presyncope or syncope. He's not having any chest pressure, neck or arm discomfort. He's been very good about his salt. Of note his potassium was 5.5 recently. He actually did not take his potassium on discharge as he was instructed which turns out to be a good guess on his part.   Allergies  Allergen Reactions  . Beta Adrenergic Blockers     Swollen hands  . Fish Oil Diarrhea  . Januvia (Sitagliptin Phosphate) Other (See Comments)    Leg cramps  . Tetanus-Diphtheria Toxoids Td Swelling    Current Outpatient Prescriptions  Medication Sig Dispense Refill  . aspirin 325 MG EC tablet Take 325 mg by mouth daily.        Marland Kitchen ezetimibe (ZETIA) 10 MG tablet Take 10 mg by mouth daily.        . ferrous gluconate (FERGON) 324 MG tablet Take 1 tablet (324 mg total) by mouth daily with breakfast.      . furosemide (LASIX) 40 MG tablet Take two tablets (80mg ) daily for 3 days (until 11/23/11), then resume taking one tablet (40mg ) daily.  90 tablet  3  . gabapentin (NEURONTIN) 100 MG capsule Take 300 mg by mouth as needed.       Marland Kitchen glimepiride (AMARYL) 1 MG tablet Take 0.5-1 mg by mouth 2 (two) times daily as needed. If BG is over 120      . lisinopril (PRINIVIL,ZESTRIL) 5 MG tablet Take 2.5 mg by mouth daily.       . metFORMIN (GLUCOPHAGE) 500 MG tablet Take 500-1,500 mg by mouth as directed.  Takes 1 tablet in the morning and 3 tablets in the evening      . pravastatin (PRAVACHOL) 40 MG tablet Take 80 mg by mouth at bedtime.        Past Medical History  Diagnosis Date  . CAD (coronary artery disease)     a. s/p CABG 1994;  b.  Patent grafts 07/2011  . Chronic systolic CHF (congestive heart failure)     a. EF 25% in 2005;  b. Echo 07/2011: EF 20-25%  . Anemia   . NIDDM (non-insulin dependent diabetes mellitus)   . Hyperlipidemia   . Hyperkalemia     a. 07/2011 ->acei d/c'd.  . Ischemic cardiomyopathy     Past Surgical History  Procedure Date  . Coronary artery bypass graft     1994. LIMA to the LAD, sequential SVG to first and second obtuse marginal, sequential SVG to the right coronary artery.    ROS: As stated in the HPI and negative for all other systems.  PHYSICAL EXAM BP 115/60  Pulse 62  Ht 5\' 11"  (1.803 m)  Wt 178 lb (80.74 kg)  BMI 24.83 kg/m2 GENERAL:  Well appearing HEENT:  Pupils equal round and reactive, fundi not visualized, oral mucosa unremarkable NECK:  6  cm jjugular venous distention, waveform within normal limits, carotid upstroke brisk and symmetric, no bruits, no thyromegaly LYMPHATICS:  No cervical, inguinal adenopathy LUNGS:  Clear to auscultation bilaterally BACK:  No CVA tenderness CHEST:  Unremarkable HEART:  PMI not displaced or sustained,S1 and S2 within normal limits, positive S3, no S4, no clicks, no rubs, no murmurs ABD:  Flat, positive bowel sounds normal in frequency in pitch, no bruits, no rebound, no guarding, no midline pulsatile mass, no hepatomegaly, no splenomegaly EXT:  2 plus pulses throughout, mild edema, no cyanosis no clubbing SKIN:  No rashes no nodules NEURO:  Cranial nerves II through XII grossly intact, motor grossly intact throughout PSYCH:  Cognitively intact, oriented to person place and time   ASSESSMENT AND PLAN  Acute on chronic combined systolic and diastolic congestive heart failure -  He presents for his  transition of care appointment. I think that he is fairly euvolemic at this point.he's going to continue the current dose of diuretic and we've reviewed extensively salt restriction, fluid management and when necessary dosing of his diuretics based on daily weights. I would like to check another basic metabolic profile in one week. I would like to see him back in one month. I am going to start very low-dose of carvedilol. He said he didn't tolerate beta blockers in the past but the only contraindication I see as a reaction was "swollen hands" when he was working.   Of note he has not wanted to consider a defibrillator.   CAD (coronary artery disease) -  The patient has no new sypmtoms. No further cardiovascular testing is indicated. We will continue with aggressive risk reduction and meds as listed.   Fatigue -  This continues to be a complaints. He has been very mildly anemic and I am continuing iron.

## 2011-11-26 NOTE — Patient Instructions (Addendum)
Please start Carvedilol 3.125 mg once a day Continue all other medications as listed.  Have blood work in 10 days (BMP)  Follow up in 4 weeks with Dr Antoine Poche

## 2011-12-09 ENCOUNTER — Encounter: Payer: Self-pay | Admitting: Cardiology

## 2011-12-21 DIAGNOSIS — E785 Hyperlipidemia, unspecified: Secondary | ICD-10-CM

## 2011-12-21 DIAGNOSIS — I509 Heart failure, unspecified: Secondary | ICD-10-CM

## 2011-12-21 DIAGNOSIS — I2510 Atherosclerotic heart disease of native coronary artery without angina pectoris: Secondary | ICD-10-CM

## 2011-12-21 DIAGNOSIS — I2589 Other forms of chronic ischemic heart disease: Secondary | ICD-10-CM

## 2011-12-21 DIAGNOSIS — I5023 Acute on chronic systolic (congestive) heart failure: Secondary | ICD-10-CM

## 2011-12-24 ENCOUNTER — Ambulatory Visit (INDEPENDENT_AMBULATORY_CARE_PROVIDER_SITE_OTHER): Payer: Medicare Other | Admitting: Cardiology

## 2011-12-24 ENCOUNTER — Encounter: Payer: Self-pay | Admitting: Cardiology

## 2011-12-24 VITALS — BP 120/70 | HR 94 | Ht 71.0 in | Wt 192.0 lb

## 2011-12-24 DIAGNOSIS — I5023 Acute on chronic systolic (congestive) heart failure: Secondary | ICD-10-CM

## 2011-12-24 DIAGNOSIS — I509 Heart failure, unspecified: Secondary | ICD-10-CM

## 2011-12-24 MED ORDER — METOLAZONE 2.5 MG PO TABS: 2.5000 mg | ORAL_TABLET | ORAL | Status: DC | PRN

## 2011-12-24 NOTE — Progress Notes (Signed)
HPI The patient presents for follow up of CAD and ischemic cardiomyopathy.  Unfortunately just over the last couple of days she's gained about 5 or 6 pounds. He says he does not use salt and hasn't changed anything. He keeps his feet elevated. He does take an extra diuretic if he gains 2 pounds in a day. On careful questioning he does eat out frequently. He is likely to be getting more salt than he realizes. Of note his last BUN was 53 creat 1.69.    Allergies  Allergen Reactions  . Beta Adrenergic Blockers     Swollen hands  . Fish Oil Diarrhea  . Januvia (Sitagliptin Phosphate) Other (See Comments)    Leg cramps  . Tetanus-Diphtheria Toxoids Td Swelling    Current Outpatient Prescriptions  Medication Sig Dispense Refill  . aspirin 325 MG EC tablet Take 325 mg by mouth daily.        . carvedilol (COREG) 3.125 MG tablet Take 1 tablet (3.125 mg total) by mouth daily.  30 tablet  3  . ezetimibe (ZETIA) 10 MG tablet Take 10 mg by mouth daily.        . ferrous gluconate (FERGON) 324 MG tablet Take 1 tablet (324 mg total) by mouth daily with breakfast.      . furosemide (LASIX) 40 MG tablet Take two tablets (80mg ) daily for 3 days (until 11/23/11), then resume taking one tablet (40mg ) daily.  90 tablet  3  . gabapentin (NEURONTIN) 100 MG capsule Take 300 mg by mouth as needed.       Marland Kitchen glimepiride (AMARYL) 1 MG tablet Take 0.5-1 mg by mouth 2 (two) times daily as needed. If BG is over 120      . lisinopril (PRINIVIL,ZESTRIL) 5 MG tablet Take 2.5 mg by mouth daily.       . metFORMIN (GLUCOPHAGE) 500 MG tablet Take 500-1,500 mg by mouth as directed. Takes 1 tablet in the morning and 3 tablets in the evening      . pravastatin (PRAVACHOL) 40 MG tablet Take 80 mg by mouth at bedtime.        Past Medical History  Diagnosis Date  . CAD (coronary artery disease)     a. s/p CABG 1994;  b.  Patent grafts 07/2011  . Chronic systolic CHF (congestive heart failure)     a. EF 25% in 2005;  b. Echo  07/2011: EF 20-25%  . Anemia   . NIDDM (non-insulin dependent diabetes mellitus)   . Hyperlipidemia   . Hyperkalemia     a. 07/2011 ->acei d/c'd.  . Ischemic cardiomyopathy     Past Surgical History  Procedure Date  . Coronary artery bypass graft     1994. LIMA to the LAD, sequential SVG to first and second obtuse marginal, sequential SVG to the right coronary artery.    ROS: As stated in the HPI and negative for all other systems.  PHYSICAL EXAM BP 120/70  Pulse 94  Ht 5\' 11"  (1.803 m)  Wt 192 lb (87.091 kg)  BMI 26.78 kg/m2 GENERAL:  Fatigued appearing and mildly dyspneic but in no distress  HEENT:  Pupils equal round and reactive, fundi not visualized, oral mucosa unremarkable NECK:  8 cm jjugular venous distention, waveform within normal limits, carotid upstroke brisk and symmetric, no bruits, no thyromegaly LYMPHATICS:  No cervical, inguinal adenopathy LUNGS:  Clear to auscultation bilaterally BACK:  No CVA tenderness CHEST:  Unremarkable HEART:  PMI not displaced or sustained,S1 and  S2 within normal limits, positive S3, no S4, no clicks, no rubs, no murmurs ABD:  Flat, positive bowel sounds normal in frequency in pitch, no bruits, no rebound, no guarding, no midline pulsatile mass, no hepatomegaly, no splenomegaly, abdomen slightly distended EXT:  2 plus pulses throughout, moderate lower extremity edema, no cyanosis no clubbing SKIN:  No rashes no nodules NEURO:  Cranial nerves II through XII grossly intact, motor grossly intact throughout PSYCH:  Cognitively intact, oriented to person place and time   ASSESSMENT AND PLAN  Acute on chronic combined systolic and diastolic congestive heart failure -  Unfortunately he is volume overloaded again. This may still have to do with salt in his diet. I do note his creatinine is slightly elevated. I am going to have to be very careful with diuresis. He is going to take Lasix 80 mg daily with Zaroxolyn 2.5 for the next 2 days. I  spoke with Dr. Christell Constant. He will come in for weeks and a stat basic metabolic profile on Friday and further plans will be made based on this. Of note he still has not want to consider a defibrillator. We did discuss the need to avoid being out which he does frequently.   CAD (coronary artery disease) -  The patient has no new sypmtoms. No further cardiovascular testing is indicated. We will continue with aggressive risk reduction and meds as listed.   Fatigue -  This continues to be a complaints. He has been very mildly anemic and I am continuing iron.

## 2011-12-24 NOTE — Patient Instructions (Addendum)
Please take Zaroxolyn 2.5 mg for 2 days Continue all other medications as listed  See the triage nurse on Friday at [facility] You will also have blood work this day.  Have them page Dr Antoine Poche with the results.

## 2011-12-25 ENCOUNTER — Telehealth: Payer: Self-pay | Admitting: *Deleted

## 2011-12-25 NOTE — Telephone Encounter (Signed)
Called to follow up with patient and how he is feeling today. He states he is feeling much better and that his weight of down to 180LBS.  He will follow up at [facility] tomorrow as Dr Antoine Poche and Dr Christell Constant discussed.

## 2011-12-26 ENCOUNTER — Telehealth: Payer: Self-pay | Admitting: Cardiology

## 2011-12-26 ENCOUNTER — Encounter: Payer: Self-pay | Admitting: Cardiology

## 2011-12-26 NOTE — Telephone Encounter (Signed)
I reviewed his BMET.  He lost 10 lbs since the appt Wed.  Creat up slightly.  He will continue with the 80 mg Lasix daily and only take the Zaroxolyn if he gains 2 lbs in one day.  He will get another BMET on Monday.

## 2011-12-29 ENCOUNTER — Encounter: Payer: Self-pay | Admitting: Cardiology

## 2012-01-02 ENCOUNTER — Encounter: Payer: Self-pay | Admitting: Cardiology

## 2012-01-06 ENCOUNTER — Emergency Department (HOSPITAL_COMMUNITY)
Admission: EM | Admit: 2012-01-06 | Discharge: 2012-01-06 | Disposition: A | Discharge status: Home / Self Care | Payer: Medicare Other | Attending: Emergency Medicine | Admitting: Emergency Medicine

## 2012-01-06 ENCOUNTER — Encounter (HOSPITAL_COMMUNITY): Payer: Self-pay | Admitting: Emergency Medicine

## 2012-01-06 ENCOUNTER — Encounter: Payer: Self-pay | Admitting: Cardiology

## 2012-01-06 DIAGNOSIS — E119 Type 2 diabetes mellitus without complications: Secondary | ICD-10-CM | POA: Insufficient documentation

## 2012-01-06 DIAGNOSIS — R269 Unspecified abnormalities of gait and mobility: Secondary | ICD-10-CM | POA: Insufficient documentation

## 2012-01-06 DIAGNOSIS — I25810 Atherosclerosis of coronary artery bypass graft(s) without angina pectoris: Secondary | ICD-10-CM | POA: Insufficient documentation

## 2012-01-06 DIAGNOSIS — R112 Nausea with vomiting, unspecified: Secondary | ICD-10-CM | POA: Insufficient documentation

## 2012-01-06 DIAGNOSIS — Z79899 Other long term (current) drug therapy: Secondary | ICD-10-CM | POA: Insufficient documentation

## 2012-01-06 DIAGNOSIS — R42 Dizziness and giddiness: Secondary | ICD-10-CM | POA: Insufficient documentation

## 2012-01-06 LAB — URINALYSIS, MICROSCOPIC ONLY
Bilirubin Urine: NEGATIVE
Hgb urine dipstick: NEGATIVE
Ketones, ur: 15 mg/dL — AB
Nitrite: NEGATIVE
Protein, ur: NEGATIVE mg/dL
Specific Gravity, Urine: 1.015 (ref 1.005–1.030)
Urobilinogen, UA: 1 mg/dL (ref 0.0–1.0)

## 2012-01-06 LAB — COMPREHENSIVE METABOLIC PANEL
Albumin: 4.1 g/dL (ref 3.5–5.2)
Alkaline Phosphatase: 74 U/L (ref 39–117)
BUN: 37 mg/dL — ABNORMAL HIGH (ref 6–23)
Creatinine, Ser: 1.32 mg/dL (ref 0.50–1.35)
GFR calc Af Amer: 63 mL/min — ABNORMAL LOW (ref >90)
Glucose, Bld: 192 mg/dL — ABNORMAL HIGH (ref 70–99)
Potassium: 3.7 mEq/L (ref 3.5–5.1)
Total Bilirubin: 0.8 mg/dL (ref 0.3–1.2)
Total Protein: 7.3 g/dL (ref 6.0–8.3)

## 2012-01-06 LAB — CBC WITH DIFFERENTIAL/PLATELET
Basophils Relative: 0 % (ref 0–1)
Eosinophils Absolute: 0 10*3/uL (ref 0.0–0.7)
HCT: 34.8 % — ABNORMAL LOW (ref 39.0–52.0)
Hemoglobin: 11.2 g/dL — ABNORMAL LOW (ref 13.0–17.0)
Lymphs Abs: 0.8 10*3/uL (ref 0.7–4.0)
MCH: 24.3 pg — ABNORMAL LOW (ref 26.0–34.0)
MCHC: 32.2 g/dL (ref 30.0–36.0)
MCV: 75.7 fL — ABNORMAL LOW (ref 78.0–100.0)
Monocytes Absolute: 0.7 10*3/uL (ref 0.1–1.0)
Monocytes Relative: 7 % (ref 3–12)
RBC: 4.6 MIL/uL (ref 4.22–5.81)

## 2012-01-06 LAB — GLUCOSE, CAPILLARY

## 2012-01-06 LAB — LIPASE, BLOOD: Lipase: 56 U/L (ref 11–59)

## 2012-01-06 MED ORDER — LORAZEPAM 2 MG/ML IJ SOLN
1.0000 mg | Freq: Once | INTRAMUSCULAR | Status: AC
  Administered 2012-01-06: 1 mg via INTRAVENOUS
  Filled 2012-01-06: qty 1

## 2012-01-06 MED ORDER — ONDANSETRON 8 MG PO TBDP: 8.0000 mg | ORAL_TABLET | Freq: Three times a day (TID) | ORAL | Status: DC | PRN

## 2012-01-06 MED ORDER — ONDANSETRON HCL 4 MG/2ML IJ SOLN
4.0000 mg | Freq: Once | INTRAMUSCULAR | Status: AC
  Administered 2012-01-06: 4 mg via INTRAVENOUS
  Filled 2012-01-06: qty 2

## 2012-01-06 NOTE — ED Notes (Signed)
Patient was retching in bed and dry heaving.  Patient reports dizziness.  Nystagmus present in bilateral eyes.

## 2012-01-06 NOTE — ED Provider Notes (Signed)
History     CSN: [id number]  Arrival date & time 01/06/12  1602   First MD Initiated Contact with Patient 01/06/12 1833      Chief Complaint  Patient presents with  . Emesis    (Consider location/radiation/quality/duration/timing/severity/associated sxs/prior treatment) HPI Developed sensation of room spinning and his ears been stopped up since awakening this morning. Symptoms accompanied by nausea and vomiting. Seen by his primary care physician today prescribed meclizine she could not take because he had vomited. Symptoms are worse with changing head position or lying supine and improved with sitting up or remaining still he feels congested in his left ear no other complaint no other associated symptoms Past Medical History  Diagnosis Date  . CAD (coronary artery disease)     a. s/p CABG 1994;  b.  Patent grafts 07/2011  . Chronic systolic CHF (congestive heart failure)     a. EF 25% in 2005;  b. Echo 07/2011: EF 20-25%  . Anemia   . NIDDM (non-insulin dependent diabetes mellitus)   . Hyperlipidemia   . Hyperkalemia     a. 07/2011 ->acei d/c'd.  . Ischemic cardiomyopathy     Past Surgical History  Procedure Date  . Coronary artery bypass graft     1994. LIMA to the LAD, sequential SVG to first and second obtuse marginal, sequential SVG to the right coronary artery.    Family History  Problem Relation Age of Onset  . Heart failure Mother   . Heart attack Father 32  . Heart attack Brother 43    Deceased at 17 from heart failure    History  Substance Use Topics  . Smoking status: Former Smoker    Types: Cigarettes    Quit date: 08/26/1961  . Smokeless tobacco: Never Used   Comment: quit 40 years ago  . Alcohol Use: No      Review of Systems  Gastrointestinal: Positive for nausea and vomiting.  Neurological: Positive for dizziness.    Allergies  Beta adrenergic blockers; Fish oil; Januvia; and Tetanus-diphtheria toxoids td  Home Medications   Current  Outpatient Rx  Name Route Sig Dispense Refill  . ASPIRIN 325 MG PO TBEC Oral Take 325 mg by mouth daily.      Marland Kitchen CARVEDILOL 3.125 MG PO TABS Oral Take 3.125 mg by mouth at bedtime.    Marland Kitchen EZETIMIBE 10 MG PO TABS Oral Take 10 mg by mouth daily.      Marland Kitchen FERROUS GLUCONATE 324 (38 FE) MG PO TABS Oral Take 1 tablet (324 mg total) by mouth daily with breakfast.    . FUROSEMIDE 40 MG PO TABS  Take two tablets (80mg ) daily for 3 days (until 11/23/11), then resume taking one tablet (40mg ) daily. 90 tablet 3  . GLIMEPIRIDE 1 MG PO TABS Oral Take 0.5-1 mg by mouth 2 (two) times daily as needed. If BG is over 120    . LISINOPRIL 5 MG PO TABS Oral Take 2.5 mg by mouth daily.     Marland Kitchen METFORMIN HCL 500 MG PO TABS Oral Take 500-1,500 mg by mouth 2 (two) times daily with a meal. Takes 1 tablet in the morning and 3 tablets in the evening    . PRAVASTATIN SODIUM 40 MG PO TABS Oral Take 80 mg by mouth at bedtime.      BP 111/63  Pulse 104  Temp 97.8 F (36.6 C) (Oral)  Resp 18  SpO2 100%  Physical Exam  Nursing note and vitals reviewed. Constitutional:  He is oriented to person, place, and time. He appears well-developed and well-nourished.  HENT:  Head: Normocephalic and atraumatic.  Right Ear: External ear normal.  Left Ear: External ear normal.  Mouth/Throat: Oropharynx is clear and moist.       Bilateral tympanic membranes normal  Eyes: Conjunctivae normal are normal. Pupils are equal, round, and reactive to light.  Neck: Neck supple. No tracheal deviation present. No thyromegaly present.       No bruit  Cardiovascular: Normal rate and regular rhythm.   No murmur heard. Pulmonary/Chest: Effort normal and breath sounds normal.  Abdominal: Soft. Bowel sounds are normal. He exhibits no distension. There is no tenderness.  Musculoskeletal: Normal range of motion. He exhibits no edema and no tenderness.  Neurological: He is alert and oriented to person, place, and time. He has normal reflexes. Coordination  normal.       Gait, mildly unsteady finger-nose normal   Skin: Skin is warm and dry. No rash noted.  Psychiatric: He has a normal mood and affect. His behavior is normal. Thought content normal.    ED Course  Procedures (including critical care time)  Labs Reviewed  CBC WITH DIFFERENTIAL - Abnormal; Notable for the following:    Hemoglobin 11.2 (*)     HCT 34.8 (*)     MCV 75.7 (*)     MCH 24.3 (*)     RDW 18.3 (*)     Platelets 140 (*)     Neutrophils Relative 84 (*)     Neutro Abs 8.1 (*)     Lymphocytes Relative 8 (*)     All other components within normal limits  COMPREHENSIVE METABOLIC PANEL - Abnormal; Notable for the following:    Chloride 95 (*)     Glucose, Bld 192 (*)     BUN 37 (*)     GFR calc non Af Amer 55 (*)     GFR calc Af Amer 63 (*)     All other components within normal limits  LIPASE, BLOOD  URINALYSIS, MICROSCOPIC ONLY   No results found.   No diagnosis found.    MDM  Vertigo felt to be peripheral in etiology. No focal neurologic deficit, patient has sensation of stopped up left ear Plan Transferred to CDU symptomatic treatment        Doug Sou, MD 01/07/12 1610

## 2012-01-06 NOTE — ED Provider Notes (Signed)
Patient continued to have vomiting after moving to CDU, controlled after administration of zofran.  Patient feeling much better.  He has meclizine for home use. Will provide a prescription for zofran ODT in case persistent vomiting returns.  Patient to follow-up with his PCP as needed.  Jimmye Norman, NP 01/06/12 2312

## 2012-01-06 NOTE — ED Notes (Signed)
Alert, NAD, calm, interactive, denies pain or nausea, c/o "dizziness only when moving", wife at [employer].

## 2012-01-06 NOTE — ED Notes (Signed)
Patient denies nausea or pain at this time.  Patient reports discomfort from restless legs but reports this is normal for him.

## 2012-01-06 NOTE — ED Notes (Signed)
Assumed care of patient at this time

## 2012-01-06 NOTE — ED Notes (Signed)
Awoke this morning c/o dizziness, n/v. States dizziness worsens with mvmt, lessens wihen staying still.  Reports "room is spinning". Denies h/a, pain

## 2012-01-06 NOTE — ED Notes (Signed)
Family at bedside. 

## 2012-01-06 NOTE — ED Notes (Signed)
Report received from Melody, RN.  Patient to be transported to CDU room #3.

## 2012-01-06 NOTE — ED Notes (Addendum)
Reports woke up this AM felt dizzy, felt like L ear was clogged and R ear was clogged; went to PCP--and thought he had ear infection; had SOB earlier but states its better, also denies any increased swelling to legs; reports ate food and about 2 hours afterwards, he started to vomit; no drift noted; grips equal, clear speech, face symmetrical; denies pain; reports when he was vomiting he was have lower abd pain but as soon as stopped vomiting went away; denies CP, and abd pain

## 2012-01-07 NOTE — ED Provider Notes (Signed)
Medical screening examination/treatment/procedure(s) were conducted as a shared visit with non-physician practitioner(s) and myself.  I personally evaluated the patient during the encounter  Doug Sou, MD 01/07/12 [phone]

## 2012-01-29 ENCOUNTER — Encounter: Payer: Self-pay | Admitting: Cardiology

## 2012-02-11 ENCOUNTER — Other Ambulatory Visit: Payer: Self-pay | Admitting: Cardiology

## 2012-02-11 NOTE — Telephone Encounter (Signed)
New Problem:    Called in needing clarification about the patient's furosemide (LASIX) 40 MG tablet instructions.  Please call back.

## 2012-02-12 MED ORDER — FUROSEMIDE 40 MG PO TABS: ORAL_TABLET | ORAL | Status: DC

## 2012-02-12 NOTE — Telephone Encounter (Signed)
New problem    clarification on direction / dosage of lasix.      [facility] pharmacy  [phone]

## 2012-02-12 NOTE — Telephone Encounter (Signed)
..   Requested Prescriptions   Pending Prescriptions Disp Refills  . furosemide (LASIX) 40 MG tablet 90 tablet 3    Sig: Take 1 tab by mouth  daily

## 2012-02-13 ENCOUNTER — Encounter: Payer: Self-pay | Admitting: Cardiology

## 2012-02-17 ENCOUNTER — Telehealth: Payer: Self-pay | Admitting: Cardiology

## 2012-02-17 NOTE — Telephone Encounter (Signed)
Pt's lasix's was doubled per dr hochrein to two a day, and an extra when needed, when he picked it up it was only for one a day, needs new rx called in for higher quantity and 3 months supply, also needs lisinopril and coreg to [facility] NOT [facility] pharmacy

## 2012-02-18 MED ORDER — FUROSEMIDE 80 MG PO TABS: 80.0000 mg | ORAL_TABLET | Freq: Every day | ORAL | Status: DC

## 2012-02-18 MED ORDER — LISINOPRIL 5 MG PO TABS: 2.5000 mg | ORAL_TABLET | Freq: Every day | ORAL | Status: DC

## 2012-02-18 MED ORDER — CARVEDILOL 3.125 MG PO TABS: 3.1250 mg | ORAL_TABLET | Freq: Every day | ORAL | Status: DC

## 2012-02-18 NOTE — Telephone Encounter (Signed)
Pt aware RX sent into pharmacy.

## 2012-02-18 NOTE — Telephone Encounter (Signed)
lmom for pt to call back. Lasix was increased only for 3 days, do not see where pt was supposed to increase to 2 po daily. Will verify with pt. Lisinopril and coreg was sent into [facility].

## 2012-02-25 ENCOUNTER — Encounter: Payer: Self-pay | Admitting: Cardiology

## 2012-02-25 ENCOUNTER — Ambulatory Visit (INDEPENDENT_AMBULATORY_CARE_PROVIDER_SITE_OTHER): Payer: Medicare Other | Admitting: Cardiology

## 2012-02-25 VITALS — BP 110/70 | HR 100 | Ht 68.0 in | Wt 178.0 lb

## 2012-02-25 DIAGNOSIS — I509 Heart failure, unspecified: Secondary | ICD-10-CM

## 2012-02-25 DIAGNOSIS — E875 Hyperkalemia: Secondary | ICD-10-CM

## 2012-02-25 DIAGNOSIS — N289 Disorder of kidney and ureter, unspecified: Secondary | ICD-10-CM

## 2012-02-25 DIAGNOSIS — I5023 Acute on chronic systolic (congestive) heart failure: Secondary | ICD-10-CM

## 2012-02-25 NOTE — Progress Notes (Signed)
HPI The patient presents for follow up of CAD and ischemic cardiomyopathy.  Since I last saw him he has actually done relatively well from a standpoint of his heart failure. His weights have been stable. He did take a couple of days of Zaroxolyn after the last visit. He would get very dizzy with this and have cramping. However, his weight would come down and symptoms otherwise improved. 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 He has some dizziness with this. He has seen Dr. Annalee Genta and has been offered an MRI. However, no reversible etiology has been identified. He has had no other neurologic complaints. He's not describing any new PND or orthopnea. He is not describing any chest pressure, neck or arm discomfort.  Allergies  Allergen Reactions  . Beta Adrenergic Blockers     Swollen hands  . Fish Oil Diarrhea  . Januvia (Sitagliptin Phosphate) Other (See Comments)    Leg cramps  . Tetanus-Diphtheria Toxoids Td Swelling    Current Outpatient Prescriptions  Medication Sig Dispense Refill  . aspirin 325 MG EC tablet Take 325 mg by mouth daily.        . carvedilol (COREG) 3.125 MG tablet Take 1 tablet (3.125 mg total) by mouth at bedtime.  90 tablet  3  . ezetimibe (ZETIA) 10 MG tablet Take 10 mg by mouth daily.        . furosemide (LASIX) 80 MG tablet Take 1 tablet (80 mg total) by mouth daily.  90 tablet  3  . glimepiride (AMARYL) 1 MG tablet TAKE 4MG  DAILY      . HYDROcodone-acetaminophen (NORCO/VICODIN) 5-325 MG per tablet TAKE AS NEEDED      . lisinopril (PRINIVIL,ZESTRIL) 5 MG tablet Take 0.5 tablets (2.5 mg total) by mouth daily.  45 tablet  2  . metFORMIN (GLUCOPHAGE) 500 MG tablet Take 500-1,500 mg by mouth 2 (two) times daily with a meal. Takes 1 tablet in the morning and 3 tablets in the evening      . pravastatin (PRAVACHOL) 40 MG tablet Take 80 mg by mouth at bedtime.         Past Medical History  Diagnosis Date  . CAD (coronary artery disease)     a. s/p CABG 1994;  b.  Patent grafts 07/2011  . Chronic systolic CHF (congestive heart failure)     a. EF 25% in 2005;  b. Echo 07/2011: EF 20-25%  . Anemia   . NIDDM (non-insulin dependent diabetes mellitus)   . Hyperlipidemia   . Hyperkalemia     a. 07/2011 ->acei d/c'd.  . Ischemic cardiomyopathy     Past Surgical History  Procedure Date  . Coronary artery bypass graft     1994. LIMA to the LAD, sequential SVG to first and second obtuse marginal, sequential SVG to the right coronary artery.    ROS: As stated in the HPI and negative for all other systems.  PHYSICAL EXAM BP 110/70  Pulse 100  Ht 5\' 8"  (1.727 m)  Wt 178 lb (80.74 kg)  BMI 27.06 kg/m2 GENERAL:  Fatigued appearing and mildly dyspneic but in no distress  HEENT:  Pupils equal round and reactive, fundi not visualized, oral mucosa unremarkable NECK:  8 cm jugular venous distention, waveform within normal limits, carotid upstroke brisk and symmetric, no bruits, no thyromegaly LYMPHATICS:  No cervical, inguinal adenopathy LUNGS:  Clear to  auscultation bilaterally BACK:  No CVA tenderness CHEST:  Unremarkable HEART:  PMI not displaced or sustained,S1 and S2 within normal limits, positive S3, no S4, no clicks, no rubs, no murmurs ABD:  Flat, positive bowel sounds normal in frequency in pitch, no bruits, no rebound, no guarding, no midline pulsatile mass, no hepatomegaly, no splenomegaly, abdomen slightly distended EXT:  2 plus pulses throughout, moderate lower extremity edema, no cyanosis no clubbing SKIN:  No rashes no nodules NEURO:  Cranial nerves II through XII grossly intact, motor grossly intact throughout PSYCH:  Cognitively intact, oriented to person place and time   ASSESSMENT AND PLAN  Acute on chronic combined systolic and diastolic congestive heart failure -  It appears that his dry weight is around 170 pounds. We discussed  this at length. I do note his significantly elevated liver enzymes previously and I will repeat his basic metabolic profile. His potassium and creatinine have been very stable. I think he is more compliant with salt and fluid restriction. Advanced therapies such as an ICD. If he would comply I would send him to our heart failure clinic as I do think he has significant low output symptoms and may need more advanced therapies such as left ventricular assist device in the future.   CAD (coronary artery disease) -  The patient has no new sypmtoms. No further cardiovascular testing is indicated. We will continue with aggressive risk reduction and meds as listed.   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 He has seen Dr. Annalee Genta. I do not strongly suspect this as a side effect of the Lasix as he has not been on particularly high doses were received high boluses. I cannot exclude this however. Regardless he clearly needs diuretics as has been demonstrated. We had a long discussion about this.

## 2012-02-25 NOTE — Patient Instructions (Addendum)
The current medical regimen is effective;  continue present plan and medications.  Please have blood work on Friday (BMP,Hepatic) at [facility]  Follow up in 1 month with Dr Antoine Poche

## 2012-02-27 ENCOUNTER — Encounter: Payer: Self-pay | Admitting: Cardiology

## 2012-04-07 ENCOUNTER — Encounter: Payer: Self-pay | Admitting: Cardiology

## 2012-04-07 ENCOUNTER — Ambulatory Visit (INDEPENDENT_AMBULATORY_CARE_PROVIDER_SITE_OTHER): Payer: Medicare Other | Admitting: Cardiology

## 2012-04-07 VITALS — BP 110/62 | HR 88 | Ht 71.0 in | Wt 184.0 lb

## 2012-04-07 DIAGNOSIS — I2510 Atherosclerotic heart disease of native coronary artery without angina pectoris: Secondary | ICD-10-CM

## 2012-04-07 DIAGNOSIS — I2589 Other forms of chronic ischemic heart disease: Secondary | ICD-10-CM

## 2012-04-07 DIAGNOSIS — I255 Ischemic cardiomyopathy: Secondary | ICD-10-CM

## 2012-04-07 DIAGNOSIS — I5022 Chronic systolic (congestive) heart failure: Secondary | ICD-10-CM

## 2012-04-07 DIAGNOSIS — I509 Heart failure, unspecified: Secondary | ICD-10-CM

## 2012-04-07 DIAGNOSIS — E119 Type 2 diabetes mellitus without complications: Secondary | ICD-10-CM

## 2012-04-07 NOTE — Progress Notes (Signed)
HPI The patient presents for follow up of CAD and ischemic cardiomyopathy.  Since I last saw him he has actually done relatively well from a standpoint of his heart failure. His weights have been creeping up but he is feeling better and he better. He did take some extra diuretic as his weight went up but he didn't seem to make a difference. However, he says he's not having any significant shortness of breath he previously was having.  He's not having any new PND or orthopnea. She's not having edema.   Of note I was delighted to see that his last blood work demonstrated his liver enzymes were normal. Potassium was high normal. His creatinine was very stable.  Allergies  Allergen Reactions  . Beta Adrenergic Blockers     Swollen hands  . Fish Oil Diarrhea  . Januvia (Sitagliptin Phosphate) Other (See Comments)    Leg cramps  . Tetanus-Diphtheria Toxoids Td Swelling    Current Outpatient Prescriptions  Medication Sig Dispense Refill  . aspirin 81 MG tablet Take 81 mg by mouth daily.      . carvedilol (COREG) 3.125 MG tablet Take 1 tablet (3.125 mg total) by mouth at bedtime.  90 tablet  3  . ezetimibe (ZETIA) 10 MG tablet Take 10 mg by mouth daily.        . furosemide (LASIX) 80 MG tablet Take 1 tablet (80 mg total) by mouth daily.  90 tablet  3  . glimepiride (AMARYL) 1 MG tablet TAKE 4MG  DAILY      . HYDROcodone-acetaminophen (NORCO/VICODIN) 5-325 MG per tablet TAKE AS NEEDED      . lisinopril (PRINIVIL,ZESTRIL) 5 MG tablet Take 0.5 tablets (2.5 mg total) by mouth daily.  45 tablet  2  . metFORMIN (GLUCOPHAGE) 500 MG tablet Take 500-1,500 mg by mouth 2 (two) times daily with a meal. Takes 1 tablet in the morning and 3 tablets in the evening      . pravastatin (PRAVACHOL) 40 MG tablet Take 80 mg by mouth at bedtime.        Past Medical History  Diagnosis Date  . CAD (coronary artery disease)     a. s/p CABG 1994;  b.  Patent grafts 07/2011  . Chronic systolic CHF (congestive heart  failure)     a. EF 25% in 2005;  b. Echo 07/2011: EF 20-25%  . Anemia   . NIDDM (non-insulin dependent diabetes mellitus)   . Hyperlipidemia   . Hyperkalemia     a. 07/2011 ->acei d/c'd.  . Ischemic cardiomyopathy     Past Surgical History  Procedure Date  . Coronary artery bypass graft     1994. LIMA to the LAD, sequential SVG to first and second obtuse marginal, sequential SVG to the right coronary artery.    ROS: As stated in the HPI and negative for all other systems.  PHYSICAL EXAM BP 110/62  Pulse 88  Ht 5\' 11"  (1.803 m)  Wt 184 lb (83.462 kg)  BMI 25.66 kg/m2  SpO2 99% GENERAL:  No distress NECK:  8 cm jugular venous distention, waveform within normal limits, carotid upstroke brisk and symmetric, no bruits, no thyromegaly LYMPHATICS:  No cervical, inguinal adenopathy LUNGS:  Clear to auscultation bilaterally BACK:  No CVA tenderness CHEST:  Unremarkable HEART:  PMI not displaced or sustained,S1 and S2 within normal limits, positive S3, no S4, no clicks, no rubs, no murmurs ABD:  Flat, positive bowel sounds normal in frequency in pitch, no bruits, no  rebound, no guarding, no midline pulsatile mass, no hepatomegaly, no splenomegaly, abdomen slightly distended EXT:  2 plus pulses throughout, moderate lower extremity edema, no cyanosis no clubbing, dependent rubor    ASSESSMENT AND PLAN  Acute on chronic combined systolic and diastolic congestive heart failure -  The patient's weight is slowly creeping up but he is eating better. I am going to give him 2 days of 2.5 mg Zaroxolyn. Get a basic metabolic profile in 7 days. Otherwise he has not tolerated med titration. He will remain on the meds as listed with salt restriction and volume management and careful attention 2 weeks.   CAD (coronary artery disease) -  The patient has no new sypmtoms. No further cardiovascular testing is indicated. We will continue with aggressive risk reduction and meds as listed.   Hearing  Loss He is apparently going to be fitted with hearing aides.

## 2012-04-07 NOTE — Patient Instructions (Addendum)
Please take Zaroxlyn to 2 days. Continue all other medications as listed.  Have blood work in 1 week (BMP).  Follow up with Dr Antoine Poche in 3 months.

## 2012-04-14 ENCOUNTER — Encounter: Payer: Self-pay | Admitting: Cardiology

## 2012-04-22 ENCOUNTER — Other Ambulatory Visit: Payer: Self-pay | Admitting: *Deleted

## 2012-04-22 MED ORDER — METOLAZONE 2.5 MG PO TABS: ORAL_TABLET | ORAL | Status: DC

## 2012-06-02 ENCOUNTER — Encounter: Payer: Self-pay | Admitting: Cardiology

## 2012-07-20 ENCOUNTER — Telehealth: Payer: Self-pay | Admitting: Family Medicine

## 2012-07-22 NOTE — Telephone Encounter (Signed)
RX SENT TO DR Christell Constant FOR REVIEW

## 2012-07-23 ENCOUNTER — Telehealth: Payer: Self-pay | Admitting: Family Medicine

## 2012-07-23 NOTE — Telephone Encounter (Signed)
New rx sent to [facility]. Patient has no voice mail

## 2012-07-28 ENCOUNTER — Encounter: Payer: Self-pay | Admitting: Cardiology

## 2012-07-28 ENCOUNTER — Ambulatory Visit (INDEPENDENT_AMBULATORY_CARE_PROVIDER_SITE_OTHER): Payer: Medicare Other | Admitting: Cardiology

## 2012-07-28 VITALS — BP 106/56 | HR 84 | Ht 71.0 in | Wt 174.0 lb

## 2012-07-28 DIAGNOSIS — I2589 Other forms of chronic ischemic heart disease: Secondary | ICD-10-CM

## 2012-07-28 DIAGNOSIS — I255 Ischemic cardiomyopathy: Secondary | ICD-10-CM

## 2012-07-28 DIAGNOSIS — I2510 Atherosclerotic heart disease of native coronary artery without angina pectoris: Secondary | ICD-10-CM

## 2012-07-28 NOTE — Patient Instructions (Addendum)
The current medical regimen is effective;  continue present plan and medications.  Follow up in 6 months with Dr Hochrein.  You will receive a letter in the mail 2 months before you are due.  Please call us when you receive this letter to schedule your follow up appointment.  

## 2012-07-28 NOTE — Progress Notes (Signed)
HPI The patient presents for follow up of CAD and ischemic cardiomyopathy.  He is doing much better since I last saw him. He is actively managing his volume. I reviewed labs from last month drawn by Dr. Christell Constant. His sodium and potassium and renal function are very good. He does have to take Zaroxolyn periodically but not frequently. He denies any shortness of breath and thinks his exercise tolerance is improving. His mood is much better. He denies any PND or orthopnea. He has had no chest pressure, neck or arm discomfort. She's not noticing any palpitations, presyncope or syncope.  Allergies  Allergen Reactions  . Beta Adrenergic Blockers     Swollen hands  . Fish Oil Diarrhea  . Januvia (Sitagliptin Phosphate) Other (See Comments)    Leg cramps  . Tetanus-Diphtheria Toxoids Td Swelling    Current Outpatient Prescriptions  Medication Sig Dispense Refill  . aspirin 81 MG tablet Take 81 mg by mouth daily.      . carvedilol (COREG) 3.125 MG tablet Take 1 tablet (3.125 mg total) by mouth at bedtime.  90 tablet  3  . ezetimibe (ZETIA) 10 MG tablet Take 10 mg by mouth daily.        . furosemide (LASIX) 80 MG tablet Take 1 tablet (80 mg total) by mouth daily.  90 tablet  3  . glimepiride (AMARYL) 1 MG tablet TAKE 4MG  DAILY      . HYDROcodone-acetaminophen (NORCO/VICODIN) 5-325 MG per tablet TAKE AS NEEDED      . lisinopril (PRINIVIL,ZESTRIL) 5 MG tablet Take 0.5 tablets (2.5 mg total) by mouth daily.  45 tablet  2  . metFORMIN (GLUCOPHAGE) 500 MG tablet Take 500-1,500 mg by mouth 2 (two) times daily with a meal. Takes 1 tablet in the morning and 3 tablets in the evening      . metolazone (ZAROXOLYN) 2.5 MG tablet Take one tablet as needed for weight gain of 2 lbs.  30 tablet  6  . pravastatin (PRAVACHOL) 40 MG tablet Take 80 mg by mouth at bedtime.       No current facility-administered medications for this visit.    Past Medical History  Diagnosis Date  . CAD (coronary artery disease)    a. s/p CABG 1994;  b.  Patent grafts 07/2011  . Chronic systolic CHF (congestive heart failure)     a. EF 25% in 2005;  b. Echo 07/2011: EF 20-25%  . Anemia   . NIDDM (non-insulin dependent diabetes mellitus)   . Hyperlipidemia   . Hyperkalemia     a. 07/2011 ->acei d/c'd.  . Ischemic cardiomyopathy     Past Surgical History  Procedure Laterality Date  . Coronary artery bypass graft      1994. LIMA to the LAD, sequential SVG to first and second obtuse marginal, sequential SVG to the right coronary artery.    ROS: As stated in the HPI and negative for all other systems.  PHYSICAL EXAM BP 106/56  Pulse 84  Ht 5\' 11"  (1.803 m)  Wt 174 lb (78.926 kg)  BMI 24.28 kg/m2 GENERAL:  No distress NECK:  No jugular venous distention at 45 degrees, waveform within normal limits, carotid upstroke brisk and symmetric, no bruits, no thyromegaly LUNGS:  Clear to auscultation bilaterally CHEST:  Unremarkable HEART:  PMI not displaced or sustained,S1 and S2 within normal limits, positive S3, no S4, no clicks, no rubs, no murmurs ABD:  Flat, positive bowel sounds normal in frequency in pitch, no bruits, no  rebound, no guarding, no midline pulsatile mass, no hepatomegaly, no splenomegaly, abdomen slightly distended EXT:  2 plus pulses throughout, moderate lower extremity edema, no cyanosis no clubbing, dependent rubor  EKG:  Sinus rhythm, borderline first degree AV block, right axis deviation, left ventricle hypertrophy by voltage criteria, premature ventricular contractions, old anteroseptal infarct with prominent T wave changes and ST segment elevation unchanged from previous. 07/28/2012   ASSESSMENT AND PLAN  Acute on chronic combined systolic and diastolic congestive heart failure -  He is finally doing well. At this point no change in therapy is indicated. We did talk about the possibility of dehydration as he starting to work out side more. I gave him parameters for reducing his diuretics as  needed as well. I will defer followup of his basic metabolic profile to Dr. Christell Constant.   I think this should be followed closely.  CAD (coronary artery disease) -  The patient has no new sypmtoms. No further cardiovascular testing is indicated. We will continue with aggressive risk reduction and meds as listed.

## 2012-09-02 ENCOUNTER — Telehealth: Payer: Self-pay | Admitting: Cardiology

## 2012-09-02 NOTE — Telephone Encounter (Signed)
New problem    Pt stated he was to call you regarding a lawn mower frame he has for you

## 2012-09-09 ENCOUNTER — Encounter: Payer: Self-pay | Admitting: *Deleted

## 2012-09-20 ENCOUNTER — Telehealth: Payer: Self-pay | Admitting: Family Medicine

## 2012-09-21 NOTE — Telephone Encounter (Signed)
Forward to pool a

## 2012-09-21 NOTE — Telephone Encounter (Signed)
Have patient come in to look at either canal to see what we can do to fix the problem

## 2012-09-21 NOTE — Telephone Encounter (Signed)
[facility] CAME OUT YEST AND SHE GAVE HIM RX FOR HIS EAR I ADVISED WIFE TO CALL IF EAR NO BETTER . SHE VERBALIZED UNDERSTANDING.

## 2012-09-22 ENCOUNTER — Other Ambulatory Visit: Payer: Self-pay

## 2012-09-22 MED ORDER — LISINOPRIL 5 MG PO TABS: 2.5000 mg | ORAL_TABLET | Freq: Every day | ORAL | Status: DC

## 2012-09-28 ENCOUNTER — Other Ambulatory Visit: Payer: Self-pay

## 2012-09-28 MED ORDER — LISINOPRIL 5 MG PO TABS: 2.5000 mg | ORAL_TABLET | Freq: Every day | ORAL | Status: DC

## 2012-10-05 ENCOUNTER — Ambulatory Visit (INDEPENDENT_AMBULATORY_CARE_PROVIDER_SITE_OTHER): Payer: Medicare Other | Admitting: Family Medicine

## 2012-10-05 ENCOUNTER — Telehealth: Payer: Self-pay | Admitting: *Deleted

## 2012-10-05 VITALS — BP 116/66 | HR 77 | Temp 97.7°F | Wt 182.8 lb

## 2012-10-05 DIAGNOSIS — H6691 Otitis media, unspecified, right ear: Secondary | ICD-10-CM

## 2012-10-05 DIAGNOSIS — H6690 Otitis media, unspecified, unspecified ear: Secondary | ICD-10-CM

## 2012-10-05 NOTE — Telephone Encounter (Signed)
Pt aware and is on his way to be seen late clinic.

## 2012-10-05 NOTE — Patient Instructions (Signed)

## 2012-10-05 NOTE — Telephone Encounter (Signed)
Patient had right OM.  He was treated with Amoxicillin 500mg  BID x 7 days.  Infection was unresolved and patient still complained of pain at that point.  He was then treated with Ceftin 500mg  x 2 weeks.  The pain has resolved but he continues to have drainage from that ear. Do you have any suggestions?

## 2012-10-05 NOTE — Telephone Encounter (Signed)
Needs to be  Seen to see if perforated ear drum. FW

## 2012-10-05 NOTE — Progress Notes (Signed)
  Subjective:    Patient ID: Luke Pittman, male    DOB: 1945/11/30, 67 y.o.   MRN: [id number]  HPI This 67 y.o. male presents for evaluation of right ear infection and drainage from right ear.  He has been tx for  ROM for a few weeks with 2 rounds of abx's and today he noticed he has had some drainage from His right ear.  He was having considerable right otalgia for the last few days and he notes the discomfort Has improved over the last day.   Review of Systems   C/o right otalgia and drainage. No chest pain, SOB, HA, dizziness, vision change, N/V, diarrhea, constipation, dysuria, urinary urgency or frequency, myalgias, arthralgias or rash.  Objective:   Physical Exam   Vital signs noted  Well developed well nourished male.  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                Nose - Nares patent                 Throat - oropharanx wnl Respiratory - Lungs CTA bilateral Cardiac - RRR S1 and S2 without murmur GI - Abdomen soft Nontender and bowel sounds active x 4 Extremities - No edema. Neuro - Grossly intact.     Assessment & Plan:  Otitis media, right Augmentin 875mg  one po bid x 10 days and referral to ENT.  Follow up prn  Otalgia right ear - Tylenol and motrin otc prn for pain and discomfort.

## 2012-10-07 ENCOUNTER — Ambulatory Visit: Payer: Self-pay | Admitting: Family Medicine

## 2012-10-19 ENCOUNTER — Ambulatory Visit (INDEPENDENT_AMBULATORY_CARE_PROVIDER_SITE_OTHER): Payer: Medicare Other | Admitting: Family Medicine

## 2012-10-19 ENCOUNTER — Encounter: Payer: Self-pay | Admitting: Family Medicine

## 2012-10-19 VITALS — BP 117/65 | HR 70 | Temp 96.8°F | Ht 71.0 in | Wt 182.0 lb

## 2012-10-19 DIAGNOSIS — I509 Heart failure, unspecified: Secondary | ICD-10-CM

## 2012-10-19 DIAGNOSIS — E785 Hyperlipidemia, unspecified: Secondary | ICD-10-CM

## 2012-10-19 DIAGNOSIS — I2510 Atherosclerotic heart disease of native coronary artery without angina pectoris: Secondary | ICD-10-CM

## 2012-10-19 DIAGNOSIS — I5022 Chronic systolic (congestive) heart failure: Secondary | ICD-10-CM

## 2012-10-19 DIAGNOSIS — E119 Type 2 diabetes mellitus without complications: Secondary | ICD-10-CM

## 2012-10-19 NOTE — Progress Notes (Signed)
  Subjective:    Patient ID: Luke Pittman, male    DOB: [birth year], 67 y.o.   MRN: [id number]  HPI Patient returns to clinic today for followup and management of chronic medical conditions. These include coronary artery disease hyperlipidemia and non-insulin-dependent diabetes mellitus2. Patient is followed by the cardiologist regularly for his heart disease. He saw a cardiologist in June and he sees him about every couple of months, but he was doing so well the next visit was scheduled for 6 months. This will be drawn today to monitor  these medical conditions.  Review of Systems  Constitutional: Negative.   HENT: Positive for dental problem, postnasal drip and sinus pressure.   Eyes: Negative.   Respiratory: Negative.   Cardiovascular: Negative.   Gastrointestinal: Negative.   Endocrine: Negative.   Genitourinary: Negative.   Musculoskeletal: Negative.   Skin: Negative.   Allergic/Immunologic: Negative.   Neurological: Negative.   Hematological: Negative.   Psychiatric/Behavioral: Negative.        Objective:   Physical Exam BP 117/65  Pulse 70  Temp(Src) 96.8 F (36 C) (Oral)  Ht 5\' 11"  (1.803 m)  Wt 182 lb (82.555 kg)  BMI 25.4 kg/m2  The patient appeared well nourished and normally developed, alert and oriented to time and place. He had a positive attitude with his current condition.  Speech, behavior and judgement appear normal. Vital signs as documented.  Head exam is unremarkable. No scleral icterus or pallor noted. Both ear canals and TMs were normal today. Mouth and throat were normal. There was no drainage in the posterior throat  Neck is without jugular venous distension, thyromegally, or carotid bruits. Carotid upstrokes are brisk bilaterally. No cervical adenopathy. Lungs are clear anteriorly and posteriorly to auscultation. Normal respiratory effort. Cardiac exam reveals regular rate and rhythm at 84 per minute. First and second heart sounds normal.  No murmurs,  rubs or gallops.  Abdominal exam reveals normal bowl sounds, no masses, no organomegaly and no aortic enlargement. No inguinal adenopathy. Extremities are nonedematous and both femoral and pedal pulses are normal. Skin without pallor or jaundice.  Warm and dry, without rash. Neurologic exam reveals normal deep tendon reflexes and normal sensation. A. diabetic foot exam was done today          Assessment & Plan:  1. Diabetes - POCT glycosylated hemoglobin (Hb A1C) - BASIC METABOLIC PANEL WITH GFR  2. Other and unspecified hyperlipidemia - Hepatic function panel - NMR Lipoprofile with Lipids - BASIC METABOLIC PANEL WITH GFR  3. CAD  4. Chronic systolic CHF (congestive heart failure)   Patient Instructions  Fall precautions discussed at visit Continue current medication Continued directions as per the cardiologist Be sure to check feet regularly especially the bottom of the left foot.   Nyra Capes MD

## 2012-10-19 NOTE — Patient Instructions (Addendum)
Fall precautions discussed at visit Continue current medication Continued directions as per the cardiologist Be sure to check feet regularly especially the bottom of the left foot.

## 2012-10-20 ENCOUNTER — Other Ambulatory Visit: Payer: Self-pay | Admitting: *Deleted

## 2012-10-20 ENCOUNTER — Telehealth: Payer: Self-pay | Admitting: *Deleted

## 2012-10-20 ENCOUNTER — Other Ambulatory Visit (INDEPENDENT_AMBULATORY_CARE_PROVIDER_SITE_OTHER): Payer: Medicare Other

## 2012-10-20 DIAGNOSIS — I10 Essential (primary) hypertension: Secondary | ICD-10-CM

## 2012-10-20 DIAGNOSIS — Z79899 Other long term (current) drug therapy: Secondary | ICD-10-CM

## 2012-10-20 DIAGNOSIS — E119 Type 2 diabetes mellitus without complications: Secondary | ICD-10-CM

## 2012-10-20 DIAGNOSIS — E785 Hyperlipidemia, unspecified: Secondary | ICD-10-CM

## 2012-10-20 MED ORDER — GLUCOSE BLOOD VI STRP: ORAL_STRIP | Status: DC

## 2012-10-20 NOTE — Telephone Encounter (Signed)
Message copied by Bearl Mulberry on Wed Oct 20, 2012  5:07 PM ------      Message from: Ernestina Penna      Created: Wed Oct 20, 2012  1:32 PM       Hemoglobin A1c 7.3%, ideally less than 7 would be better. Continue aggressive therapy lifestyle changes which include diet and exercise ------

## 2012-10-20 NOTE — Telephone Encounter (Signed)
Pt notified of result

## 2012-10-21 LAB — NMR LIPOPROFILE WITH LIPIDS
HDL-C: 41 mg/dL (ref >=40)
LDL (calc): 81 mg/dL (ref <100)
LDL Particle Number: 1408 nmol/L — ABNORMAL HIGH (ref <1000)
LDL Size: 19.8 nm — ABNORMAL LOW (ref >20.5)
LP-IR Score: 69 — ABNORMAL HIGH (ref <=45)
Small LDL Particle Number: 1082 nmol/L — ABNORMAL HIGH (ref <=527)
VLDL Size: 42.4 nm (ref <=46.6)

## 2012-10-21 LAB — BASIC METABOLIC PANEL WITH GFR
Calcium: 9.6 mg/dL (ref 8.4–10.5)
Creat: 1.17 mg/dL (ref 0.50–1.35)
GFR, Est African American: 74 mL/min
GFR, Est Non African American: 64 mL/min
Sodium: 139 mEq/L (ref 135–145)

## 2012-10-21 LAB — HEPATIC FUNCTION PANEL
Albumin: 4.4 g/dL (ref 3.5–5.2)
Bilirubin, Direct: 0.1 mg/dL (ref 0.0–0.3)
Total Bilirubin: 0.4 mg/dL (ref 0.3–1.2)

## 2012-10-29 ENCOUNTER — Encounter: Payer: Self-pay | Admitting: *Deleted

## 2012-11-01 ENCOUNTER — Telehealth: Payer: Self-pay | Admitting: Family Medicine

## 2012-11-02 ENCOUNTER — Telehealth: Payer: Self-pay | Admitting: *Deleted

## 2012-11-02 NOTE — Telephone Encounter (Signed)
Pt notified of results

## 2012-11-02 NOTE — Telephone Encounter (Signed)
LMOM

## 2012-11-02 NOTE — Telephone Encounter (Signed)
Message copied by Bearl Mulberry on Tue Nov 02, 2012  1:56 PM ------      Message from: Ernestina Penna      Created: Thu Oct 21, 2012  1:37 PM       The total LDL particle number remains elevated at 1408. The LDL C. is 81. Triglycerides are elevated at 198. Small LDL [place] number is elevated. The HDL particle number is excellent .------- continue current medication and as aggressive therapeutic lifestyle changes as possible, which means diet and exercise      Potassium was slightly elevated at 5.4, blood sugar was elevated at 145, creatinine or the most important kidney function test was within normal limit            Repeat BMP nonfasting in one week due to the elevated potassium      All liver function tests were within normal limit ------

## 2012-11-03 NOTE — Telephone Encounter (Signed)
This encounter was created in error - please disregard.

## 2012-11-16 ENCOUNTER — Other Ambulatory Visit: Payer: Self-pay

## 2012-11-16 MED ORDER — FUROSEMIDE 80 MG PO TABS: 80.0000 mg | ORAL_TABLET | Freq: Every day | ORAL | Status: DC

## 2012-11-16 MED ORDER — CARVEDILOL 3.125 MG PO TABS: 3.1250 mg | ORAL_TABLET | Freq: Every day | ORAL | Status: DC

## 2012-11-26 ENCOUNTER — Ambulatory Visit (INDEPENDENT_AMBULATORY_CARE_PROVIDER_SITE_OTHER): Payer: Medicare Other | Admitting: Family Medicine

## 2012-11-26 ENCOUNTER — Other Ambulatory Visit: Payer: Self-pay | Admitting: Family Medicine

## 2012-11-26 ENCOUNTER — Encounter: Payer: Self-pay | Admitting: Family Medicine

## 2012-11-26 ENCOUNTER — Telehealth: Payer: Self-pay | Admitting: Family Medicine

## 2012-11-26 VITALS — BP 120/65 | HR 91 | Temp 97.7°F | Ht 71.0 in | Wt 184.0 lb

## 2012-11-26 DIAGNOSIS — R21 Rash and other nonspecific skin eruption: Secondary | ICD-10-CM

## 2012-11-26 MED ORDER — NYSTATIN-TRIAMCINOLONE 100000-0.1 UNIT/GM-% EX OINT
TOPICAL_OINTMENT | Freq: Two times a day (BID) | CUTANEOUS | Status: DC
Start: 2012-11-26 — End: 2012-11-26

## 2012-11-26 MED ORDER — NYSTATIN-TRIAMCINOLONE 100000-0.1 UNIT/GM-% EX OINT: TOPICAL_OINTMENT | Freq: Two times a day (BID) | CUTANEOUS | Status: DC

## 2012-11-26 MED ORDER — METHYLPREDNISOLONE (PAK) 4 MG PO TABS: ORAL_TABLET | ORAL | Status: DC

## 2012-11-26 NOTE — Telephone Encounter (Signed)
appt made

## 2012-11-26 NOTE — Progress Notes (Signed)
  Subjective:    Patient ID: Luke Pittman, male    DOB: Apr 02, 1945, 67 y.o.   MRN: [id number]  HPI This 67 y.o. male presents for evaluation of rash on lower extremities bilateral. He is worried he may have shingles.   Review of Systems C/o rash and pruritis lower extremties   No chest pain, SOB, HA, dizziness, vision change, N/V, diarrhea, constipation, dysuria, urinary urgency or frequency, myalgias, arthralgias.  Objective:   Physical Exam Vital signs noted  Well developed well nourished male.  HEENT - Head atraumatic Normocephalic                Eyes - PERRLA, Conjuctiva - clear Sclera- Clear EOMI                Ears - EAC's Wnl TM's Wnl Gross Hearing WNL                Throat - oropharanx wnl Respiratory - Lungs CTA bilateral Cardiac - RRR S1 and S2 without murmur Extremities - Bilateral lower legs with erythematous rash and areas of excoriation from itching. Neuro - Grossly intact.       Assessment & Plan:  Rash and nonspecific skin eruption - Plan: methylPREDNIsolone (MEDROL DOSPACK) 4 MG tablet, nystatin-triamcinolone ointment (MYCOLOG) Benadryl otc as directed.

## 2012-11-26 NOTE — Patient Instructions (Signed)

## 2012-12-29 DIAGNOSIS — H9192 Unspecified hearing loss, left ear: Secondary | ICD-10-CM

## 2012-12-29 HISTORY — DX: Unspecified hearing loss, left ear: H91.92

## 2013-01-05 DIAGNOSIS — I639 Cerebral infarction, unspecified: Secondary | ICD-10-CM

## 2013-01-05 HISTORY — DX: Cerebral infarction, unspecified: I63.9

## 2013-01-19 ENCOUNTER — Ambulatory Visit (INDEPENDENT_AMBULATORY_CARE_PROVIDER_SITE_OTHER): Payer: Medicare Other | Admitting: *Deleted

## 2013-01-19 DIAGNOSIS — Z23 Encounter for immunization: Secondary | ICD-10-CM

## 2013-01-31 ENCOUNTER — Other Ambulatory Visit: Payer: Self-pay | Admitting: *Deleted

## 2013-01-31 MED ORDER — METFORMIN HCL 500 MG PO TABS: ORAL_TABLET | ORAL | Status: DC

## 2013-01-31 MED ORDER — GLIMEPIRIDE 4 MG PO TABS: ORAL_TABLET | ORAL | Status: DC

## 2013-03-15 ENCOUNTER — Ambulatory Visit (INDEPENDENT_AMBULATORY_CARE_PROVIDER_SITE_OTHER): Payer: Medicare Other | Admitting: Family Medicine

## 2013-03-15 ENCOUNTER — Encounter: Payer: Self-pay | Admitting: Family Medicine

## 2013-03-15 VITALS — BP 115/62 | HR 75 | Temp 96.7°F | Ht 71.0 in | Wt 184.0 lb

## 2013-03-15 DIAGNOSIS — H6980 Other specified disorders of Eustachian tube, unspecified ear: Secondary | ICD-10-CM

## 2013-03-15 MED ORDER — AMOXICILLIN 875 MG PO TABS: 875.0000 mg | ORAL_TABLET | Freq: Two times a day (BID) | ORAL | Status: DC

## 2013-03-15 NOTE — Progress Notes (Signed)
   Subjective:    Patient ID: Luke Pittman, male    DOB: 12-Apr-1945, 67 y.o.   MRN: [id number]  HPI  This 67 y.o. male presents for evaluation of decreased hearing acuity bilateral.  Review of Systems No chest pain, SOB, HA, dizziness, vision change, N/V, diarrhea, constipation, dysuria, urinary urgency or frequency, myalgias, arthralgias or rash.     Objective:   Physical Exam  Vital signs noted  Well developed well nourished male.  HEENT - Head atraumatic Normocephalic                Eyes - PERRLA, Conjuctiva - clear Sclera- Clear EOMI                Ears - EAC's Wnl TM's Wnl Gross Hearing WNL                Nose - Nares patent                 Throat - oropharanx wnl Respiratory - Lungs CTA bilateral Cardiac - RRR S1 and S2 without murmur GI - Abdomen soft Nontender and bowel sounds active x 4       Assessment & Plan:  ETD (eustachian tube dysfunction), unspecified laterality - Plan: amoxicillin (AMOXIL) 875 MG tablet  Deatra Canter FNP

## 2013-04-06 ENCOUNTER — Telehealth: Payer: Self-pay | Admitting: *Deleted

## 2013-04-06 MED ORDER — LISINOPRIL 5 MG PO TABS: 2.5000 mg | ORAL_TABLET | Freq: Every day | ORAL | Status: DC

## 2013-04-06 MED ORDER — CARVEDILOL 3.125 MG PO TABS: 3.1250 mg | ORAL_TABLET | Freq: Every day | ORAL | Status: DC

## 2013-04-06 NOTE — Telephone Encounter (Signed)
[facility] to [facility] refill and a follow up appt.

## 2013-04-07 ENCOUNTER — Ambulatory Visit (INDEPENDENT_AMBULATORY_CARE_PROVIDER_SITE_OTHER): Payer: Medicare HMO | Admitting: Family Medicine

## 2013-04-07 ENCOUNTER — Encounter: Payer: Self-pay | Admitting: Family Medicine

## 2013-04-07 VITALS — BP 109/57 | HR 77 | Temp 96.7°F | Ht 71.0 in | Wt 179.0 lb

## 2013-04-07 DIAGNOSIS — I25810 Atherosclerosis of coronary artery bypass graft(s) without angina pectoris: Secondary | ICD-10-CM

## 2013-04-07 DIAGNOSIS — Z0000 Encounter for general adult medical examination without abnormal findings: Secondary | ICD-10-CM

## 2013-04-07 DIAGNOSIS — J309 Allergic rhinitis, unspecified: Secondary | ICD-10-CM

## 2013-04-07 DIAGNOSIS — E559 Vitamin D deficiency, unspecified: Secondary | ICD-10-CM

## 2013-04-07 DIAGNOSIS — N400 Enlarged prostate without lower urinary tract symptoms: Secondary | ICD-10-CM

## 2013-04-07 DIAGNOSIS — D649 Anemia, unspecified: Secondary | ICD-10-CM

## 2013-04-07 DIAGNOSIS — E785 Hyperlipidemia, unspecified: Secondary | ICD-10-CM

## 2013-04-07 DIAGNOSIS — E119 Type 2 diabetes mellitus without complications: Secondary | ICD-10-CM

## 2013-04-07 LAB — POCT CBC
GRANULOCYTE PERCENT: 73.3 % (ref 37–80)
HCT, POC: 41.9 % — AB (ref 43.5–53.7)
HEMOGLOBIN: 13.5 g/dL — AB (ref 14.1–18.1)
Lymph, poc: 2.2 (ref 0.6–3.4)
MCH: 27.3 pg (ref 27–31.2)
MCHC: 32.1 g/dL (ref 31.8–35.4)
MCV: 84.8 fL (ref 80–97)
MPV: 8.5 fL (ref 0–99.8)
POC Granulocyte: 6.6 (ref 2–6.9)
POC LYMPH PERCENT: 24.1 %L (ref 10–50)
Platelet Count, POC: 167 10*3/uL (ref 142–424)
RBC: 4.9 M/uL (ref 4.69–6.13)
RDW, POC: 14.5 %
WBC: 9 10*3/uL (ref 4.6–10.2)

## 2013-04-07 LAB — POCT GLYCOSYLATED HEMOGLOBIN (HGB A1C): Hemoglobin A1C: 7.4

## 2013-04-07 MED ORDER — METFORMIN HCL 500 MG PO TABS: ORAL_TABLET | ORAL | Status: DC

## 2013-04-07 MED ORDER — LISINOPRIL 5 MG PO TABS: 5.0000 mg | ORAL_TABLET | Freq: Every day | ORAL | Status: DC

## 2013-04-07 MED ORDER — PRAVASTATIN SODIUM 80 MG PO TABS: 80.0000 mg | ORAL_TABLET | Freq: Every day | ORAL | Status: DC

## 2013-04-07 MED ORDER — EZETIMIBE 10 MG PO TABS: 10.0000 mg | ORAL_TABLET | Freq: Every day | ORAL | Status: AC

## 2013-04-07 MED ORDER — FLUTICASONE PROPIONATE 50 MCG/ACT NA SUSP: NASAL | Status: DC

## 2013-04-07 MED ORDER — CARVEDILOL 3.125 MG PO TABS: 3.1250 mg | ORAL_TABLET | Freq: Every day | ORAL | Status: DC

## 2013-04-07 MED ORDER — METOLAZONE 2.5 MG PO TABS: ORAL_TABLET | ORAL | Status: DC

## 2013-04-07 MED ORDER — GLIMEPIRIDE 4 MG PO TABS: ORAL_TABLET | ORAL | Status: DC

## 2013-04-07 MED ORDER — FUROSEMIDE 80 MG PO TABS: 80.0000 mg | ORAL_TABLET | Freq: Every day | ORAL | Status: DC

## 2013-04-07 NOTE — Addendum Note (Signed)
Addended by: Magdalene RiverBULLINS, [place] Pacella H on: 04/07/2013 10:03 AM   Modules accepted: Orders

## 2013-04-07 NOTE — Patient Instructions (Addendum)
Continue current medications. Continue good therapeutic lifestyle changes which include good diet and exercise. Fall precautions discussed with patient. Schedule your flu vaccine if you haven't had it yet If you are over 68 years old - you may need Prevnar 13 or the adult Pneumonia vaccine. You may purchase saline nose spray and saline gel over-the-counter. Use the jail at nighttime and use a saline nose spray during the day for the nasal irritation We will call you with the results of the lab work once it is available. Return the FOBT car in March. Do not forget to get your eye exam. Monitor your feet regularly.

## 2013-04-07 NOTE — Progress Notes (Signed)
Subjective:    Patient ID: Luke Pittman, male    DOB: 1946-01-09, 68 y.o.   MRN: [id number]  HPI Pt here for follow up and management of chronic medical problems. He is also here for his annual exam. Patient sees a cardiologist regularly. He also complains of some ear pain and notes that his blood sugars have been running higher recently. He has been doing well with his heart he monitors his weight daily and and he takes 40 mg of Lasix daily. If his weight rises to pounds above a baseline he takes 80 mg of Lasix. This has done well with controlling his heart failure problems. He is to see the cardiologist in a couple of months. His baseline weight on his calendar is 174 pounds. He will schedule for an eye exam. He refuses to do a colonoscopy as long as the stool cards are negative. He will not go take a Prevnar vaccination because he is allergic to tetanus. He will return his FOBT in March.       Patient Active Problem List   Diagnosis Date Noted  . Microcytic anemia 11/20/2011  . Acute on chronic systolic CHF (congestive heart failure) 11/18/2011  . Renal insufficiency 11/18/2011  . Ischemic cardiomyopathy 08/18/2011  . Hyperkalemia   . Chronic systolic CHF (congestive heart failure)   . NIDDM (non-insulin dependent diabetes mellitus)   . DM 07/02/2009  . DYSLIPIDEMIA 07/02/2009  . CAD 07/02/2009   Outpatient Encounter Prescriptions as of 04/07/2013  Medication Sig  . aspirin 81 MG tablet Take 81 mg by mouth daily.  . carvedilol (COREG) 3.125 MG tablet Take 1 tablet (3.125 mg total) by mouth at bedtime.  Marland Kitchen ezetimibe (ZETIA) 10 MG tablet Take 10 mg by mouth daily.    . furosemide (LASIX) 80 MG tablet Take 1 tablet (80 mg total) by mouth daily.  Marland Kitchen glimepiride (AMARYL) 4 MG tablet TAKE $RemoveBef'4MG'[id number]$  DAILY  . glucose blood (ACCU-CHEK ADVANTAGE TEST) test strip Use as instructed  . lisinopril (PRINIVIL,ZESTRIL) 5 MG tablet Take 5 mg by mouth daily.  . metFORMIN (GLUCOPHAGE) 500 MG tablet Takes 1  tablet in the morning and 3 tablets in the evening  . pravastatin (PRAVACHOL) 80 MG tablet Take 80 mg by mouth daily.  . metolazone (ZAROXOLYN) 2.5 MG tablet Take one tablet as needed for weight gain of 2 lbs.  . [DISCONTINUED] amoxicillin (AMOXIL) 875 MG tablet Take 1 tablet (875 mg total) by mouth 2 (two) times daily.  . [DISCONTINUED] lisinopril (PRINIVIL,ZESTRIL) 5 MG tablet Take 0.5 tablets (2.5 mg total) by mouth daily.  . [DISCONTINUED] methylPREDNIsolone (MEDROL DOSPACK) 4 MG tablet follow package directions  . [DISCONTINUED] nystatin-triamcinolone ointment (MYCOLOG) Apply topically 2 (two) times daily.    Review of Systems  Constitutional: Negative.   HENT: Positive for ear pain (had several recent ear infections- right).   Eyes: Negative.   Respiratory: Negative.   Cardiovascular: Negative.   Gastrointestinal: Negative.   Endocrine: Negative.        Blood sugars have been running higher  Genitourinary: Negative.   Musculoskeletal: Negative.   Skin: Negative.   Allergic/Immunologic: Negative.   Neurological: Negative.   Hematological: Negative.   Psychiatric/Behavioral: Negative.        Objective:   Physical Exam  Nursing note and vitals reviewed. Constitutional: He is oriented to person, place, and time. He appears well-developed. No distress.  Somewhat thin but positive attitude and normal appearance for this patient  HENT:  Head: Normocephalic and atraumatic.  Right Ear: External ear normal.  Left Ear: External ear normal.  Nose: Nose normal.  Mouth/Throat: Oropharynx is clear and moist. No oropharyngeal exudate.  Right ear canal appeared normal with no sign of any infection  Eyes: Conjunctivae and EOM are normal. Pupils are equal, round, and reactive to light. Right eye exhibits no discharge. Left eye exhibits no discharge. No scleral icterus.  Neck: Normal range of motion. Neck supple. No thyromegaly present.  No carotid bruits audible  Cardiovascular: Normal  rate, regular rhythm, normal heart sounds and intact distal pulses.  Exam reveals no gallop and no friction rub.   No murmur heard. At 60 per minute  Pulmonary/Chest: Effort normal and breath sounds normal. No respiratory distress. He has no wheezes. He has no rales. He exhibits no tenderness.  No axillary nodes  Abdominal: Soft. Bowel sounds are normal. He exhibits no distension and no mass. There is no tenderness. There is no rebound and no guarding.  No inguinal nodes  Genitourinary: Rectum normal and penis normal.  B. prostate was slightly enlarged, but soft and smooth. There were no rectal masses. The external genitalia were normal. There are no inguinal hernias present.  Musculoskeletal: Normal range of motion. He exhibits no edema and no tenderness.  Lymphadenopathy:    He has no cervical adenopathy.  Neurological: He is alert and oriented to person, place, and time. He has normal reflexes. No cranial nerve deficit.  Skin: Skin is warm and dry. No rash noted. No erythema. No pallor.  Psychiatric: He has a normal mood and affect. His behavior is normal. Judgment and thought content normal.   BP 109/57  Pulse 77  Temp(Src) 96.7 F (35.9 C) (Oral)  Ht $R'5\' 11"'qg$  (1.803 m)  Wt 179 lb (81.194 kg)  BMI 24.98 kg/m2        Assessment & Plan:  1. DM - POCT glycosylated hemoglobin (Hb A1C) - POCT UA - Microalbumin  2. DYSLIPIDEMIA - NMR, lipoprofile - Hepatic function panel  3. CAD (coronary artery disease) of artery bypass graft - BMP8+EGFR - Hepatic function panel  4. Anemia - POCT CBC  5. Vitamin D deficiency - Vit D  25 hydroxy (rtn osteoporosis monitoring)  6. BPH (benign prostatic hyperplasia) - PSA, total and free  7. Allergic rhinitis - fluticasone (FLONASE) 50 MCG/ACT nasal spray; 1-2 sprays each nostril nightly  Dispense: 16 g; Refill: 6  8. Annual physical exam  Patient Instructions  Continue current medications. Continue good therapeutic lifestyle changes  which include good diet and exercise. Fall precautions discussed with patient. Schedule your flu vaccine if you haven't had it yet If you are over 46 years old - you may need Prevnar 63 or the adult Pneumonia vaccine. You may purchase saline nose spray and saline gel over-the-counter. Use the jail at nighttime and use a saline nose spray during the day for the nasal irritation We will call you with the results of the lab work once it is available. Return the FOBT car in March. Do not forget to get your eye exam. Monitor your feet regularly.   Arrie Senate MD

## 2013-04-09 LAB — BMP8+EGFR
BUN / CREAT RATIO: 16 (ref 10–22)
BUN: 19 mg/dL (ref 8–27)
CHLORIDE: 95 mmol/L — AB (ref 97–108)
CO2: 27 mmol/L (ref 18–29)
Calcium: 9.7 mg/dL (ref 8.6–10.2)
Creatinine, Ser: 1.16 mg/dL (ref 0.76–1.27)
GFR calc Af Amer: 75 mL/min/{1.73_m2} (ref >59)
GFR, EST NON AFRICAN AMERICAN: 65 mL/min/{1.73_m2} (ref >59)
Glucose: 144 mg/dL — ABNORMAL HIGH (ref 65–99)
Potassium: 5.4 mmol/L — ABNORMAL HIGH (ref 3.5–5.2)
Sodium: 139 mmol/L (ref 134–144)

## 2013-04-09 LAB — HEPATIC FUNCTION PANEL
ALBUMIN: 4.8 g/dL (ref 3.6–4.8)
ALK PHOS: 50 IU/L (ref 39–117)
ALT: 11 IU/L (ref 0–44)
AST: 15 IU/L (ref 0–40)
BILIRUBIN DIRECT: 0.16 mg/dL (ref 0.00–0.40)
BILIRUBIN TOTAL: 0.5 mg/dL (ref 0.0–1.2)
Total Protein: 6.9 g/dL (ref 6.0–8.5)

## 2013-04-09 LAB — VITAMIN D 25 HYDROXY (VIT D DEFICIENCY, FRACTURES): Vit D, 25-Hydroxy: 17.3 ng/mL — ABNORMAL LOW (ref 30.0–100.0)

## 2013-04-09 LAB — NMR, LIPOPROFILE
Cholesterol: 162 mg/dL (ref <200)
HDL CHOLESTEROL BY NMR: 46 mg/dL (ref >=40)
HDL PARTICLE NUMBER: 36.3 umol/L (ref >=30.5)
LDL Particle Number: 1884 nmol/L — ABNORMAL HIGH (ref <1000)
LDL Size: 19.7 nm — ABNORMAL LOW (ref >20.5)
LDLC SERPL CALC-MCNC: 83 mg/dL (ref <100)
LP-IR Score: 52 — ABNORMAL HIGH (ref <=45)
Small LDL Particle Number: 1495 nmol/L — ABNORMAL HIGH (ref <=527)
Triglycerides by NMR: 164 mg/dL — ABNORMAL HIGH (ref <150)

## 2013-04-09 LAB — PSA, TOTAL AND FREE
PSA, Free Pct: 26 %
PSA, Free: 0.65 ng/mL
PSA: 2.5 ng/mL (ref 0.0–4.0)

## 2013-05-03 ENCOUNTER — Telehealth: Payer: Self-pay | Admitting: Family Medicine

## 2013-05-05 MED ORDER — VITAMIN D (ERGOCALCIFEROL) 1.25 MG (50000 UNIT) PO CAPS: 50000.0000 [IU] | ORAL_CAPSULE | ORAL | Status: DC

## 2013-05-05 NOTE — Telephone Encounter (Signed)
[facility]

## 2013-05-05 NOTE — Telephone Encounter (Signed)
rx sent in 

## 2013-05-05 NOTE — Telephone Encounter (Signed)
Please send rx for vitamin d in to [facility].

## 2013-05-23 ENCOUNTER — Telehealth: Payer: Self-pay | Admitting: Family Medicine

## 2013-06-01 ENCOUNTER — Encounter: Payer: Self-pay | Admitting: Cardiology

## 2013-06-01 ENCOUNTER — Ambulatory Visit (INDEPENDENT_AMBULATORY_CARE_PROVIDER_SITE_OTHER): Payer: Commercial Managed Care - HMO | Admitting: Cardiology

## 2013-06-01 VITALS — BP 111/68 | HR 92 | Ht 71.0 in | Wt 185.0 lb

## 2013-06-01 DIAGNOSIS — I5022 Chronic systolic (congestive) heart failure: Secondary | ICD-10-CM

## 2013-06-01 DIAGNOSIS — I509 Heart failure, unspecified: Secondary | ICD-10-CM

## 2013-06-01 DIAGNOSIS — I2510 Atherosclerotic heart disease of native coronary artery without angina pectoris: Secondary | ICD-10-CM

## 2013-06-01 NOTE — Patient Instructions (Signed)
The current medical regimen is effective;  continue present plan and medications.  Follow up in 6 months with Dr Hochrein.  You will receive a letter in the mail 2 months before you are due.  Please call us when you receive this letter to schedule your follow up appointment.  

## 2013-06-01 NOTE — Progress Notes (Signed)
HPI The patient presents for follow up of CAD and ischemic cardiomyopathy.  It has been several months since I last saw him. Thankfully he has been doing very well. He follows closely with Dr. [name]. I reviewed labs and previous problems with sodium and potassium seem to have resolved. His volume has been very stable. He hasn't needed extra diuretics. Weights have been stable. He remains active more so in the summer than winter. He's not having any of the severe shortness of breath he was having. He denies any chest pressure, neck or arm discomfort. He has had no weight gain or edema. His had no PND or orthopnea.  Allergies  Allergen Reactions  . Beta Adrenergic Blockers     Swollen hands  . Fish Oil Diarrhea  . Januvia [Sitagliptin Phosphate] Other (See Comments)    Leg cramps  . Tetanus-Diphtheria Toxoids Td Swelling    Current Outpatient Prescriptions  Medication Sig Dispense Refill  . aspirin 81 MG tablet Take 81 mg by mouth daily.      . carvedilol (COREG) 3.125 MG tablet Take 1 tablet (3.125 mg total) by mouth at bedtime.  90 tablet  3  . ezetimibe (ZETIA) 10 MG tablet Take 1 tablet (10 mg total) by mouth daily.  90 tablet  3  . fluticasone (FLONASE) 50 MCG/ACT nasal spray 1-2 sprays each nostril nightly  16 g  6  . furosemide (LASIX) 80 MG tablet Take 1 tablet (80 mg total) by mouth daily.  90 tablet  3  . glimepiride (AMARYL) 4 MG tablet TAKE 4MG  DAILY  90 tablet  3  . lisinopril (PRINIVIL,ZESTRIL) 5 MG tablet Take 1 tablet (5 mg total) by mouth daily.  90 tablet  3  . metFORMIN (GLUCOPHAGE) 500 MG tablet Takes 1 tablet in the morning and 3 tablets in the evening  360 tablet  3  . glucose blood (ACCU-CHEK ADVANTAGE TEST) test strip Use as instructed  100 each  12  . metolazone (ZAROXOLYN) 2.5 MG tablet Take one tablet as needed for weight gain of 2 lbs.  90 tablet  0  . pravastatin (PRAVACHOL) 80 MG tablet Take 1 tablet (80 mg total) by mouth daily.  90 tablet  3  . Vitamin D,  Ergocalciferol, (DRISDOL) 50000 UNITS CAPS capsule Take 1 capsule (50,000 Units total) by mouth every 7 (seven) days.  12 capsule  4   No current facility-administered medications for this visit.    Past Medical History  Diagnosis Date  . CAD (coronary artery disease)     a. s/p CABG 1994;  b.  Patent grafts 07/2011  . Chronic systolic CHF (congestive heart failure)     a. EF 25% in 2005;  b. Echo 07/2011: EF 20-25%  . Anemia   . NIDDM (non-insulin dependent diabetes mellitus)   . Hyperlipidemia   . Hyperkalemia     a. 07/2011 ->acei d/c'd.  . Ischemic cardiomyopathy     Past Surgical History  Procedure Laterality Date  . Coronary artery bypass graft      1994. LIMA to the LAD, sequential SVG to first and second obtuse marginal, sequential SVG to the right coronary artery.    ROS: As stated in the HPI and negative for all other systems.  PHYSICAL EXAM BP 111/68  Pulse 92  Ht 5\' 11"  (1.803 m)  Wt 185 lb (83.915 kg)  BMI 25.81 kg/m2 GENERAL:  No distress NECK:  No jugular venous distention at 45 degrees, waveform within normal limits,  carotid upstroke brisk and symmetric, no bruits, no thyromegaly LUNGS:  Clear to auscultation bilaterally CHEST:  Unremarkable HEART:  PMI not displaced or sustained,S1 and S2 within normal limits, positive S3, no S4, no clicks, no rubs, no murmurs ABD:  Flat, positive bowel sounds normal in frequency in pitch, no bruits, no rebound, no guarding, no midline pulsatile mass, no hepatomegaly, no splenomegaly, abdomen slightly distended EXT:  2 plus pulses throughout, moderate lower extremity edema, no cyanosis no clubbing, dependent rubor  EKG:  Sinus rhythm, rate 92,  borderline first degree AV block, right axis deviation, left ventricle hypertrophy by voltage criteria, old anteroseptal infarct with prominent T wave changes and ST segment elevation unchanged from previous. 06/01/2013   ASSESSMENT AND PLAN  Chronic combined systolic and diastolic  congestive heart failure -  He continues to do well.  No change in therapy is indicated.  Of note, he continues to refuse an ICD.  He wants "quality not quantity of life."  CAD (coronary artery disease) -  The patient has no new sypmtoms. No further cardiovascular testing is indicated. We will continue with aggressive risk reduction and meds as listed.

## 2013-08-03 ENCOUNTER — Ambulatory Visit (INDEPENDENT_AMBULATORY_CARE_PROVIDER_SITE_OTHER): Payer: Medicare HMO | Admitting: Family Medicine

## 2013-08-03 ENCOUNTER — Ambulatory Visit (INDEPENDENT_AMBULATORY_CARE_PROVIDER_SITE_OTHER): Payer: Medicare HMO

## 2013-08-03 ENCOUNTER — Encounter: Payer: Self-pay | Admitting: Family Medicine

## 2013-08-03 VITALS — BP 103/58 | HR 84 | Temp 96.3°F | Ht 71.0 in | Wt 171.0 lb

## 2013-08-03 DIAGNOSIS — R609 Edema, unspecified: Secondary | ICD-10-CM

## 2013-08-03 DIAGNOSIS — E559 Vitamin D deficiency, unspecified: Secondary | ICD-10-CM

## 2013-08-03 DIAGNOSIS — R0602 Shortness of breath: Secondary | ICD-10-CM

## 2013-08-03 DIAGNOSIS — T148 Other injury of unspecified body region: Secondary | ICD-10-CM

## 2013-08-03 DIAGNOSIS — R600 Localized edema: Secondary | ICD-10-CM

## 2013-08-03 DIAGNOSIS — W57XXXA Bitten or stung by nonvenomous insect and other nonvenomous arthropods, initial encounter: Secondary | ICD-10-CM

## 2013-08-03 DIAGNOSIS — E119 Type 2 diabetes mellitus without complications: Secondary | ICD-10-CM

## 2013-08-03 DIAGNOSIS — E785 Hyperlipidemia, unspecified: Secondary | ICD-10-CM

## 2013-08-03 DIAGNOSIS — I2510 Atherosclerotic heart disease of native coronary artery without angina pectoris: Secondary | ICD-10-CM

## 2013-08-03 MED ORDER — DOXYCYCLINE HYCLATE 100 MG PO TABS: 100.0000 mg | ORAL_TABLET | Freq: Two times a day (BID) | ORAL | Status: DC

## 2013-08-03 NOTE — Patient Instructions (Addendum)
Medicare Annual Wellness Visit  [place] and the medical providers at [facility] strive to bring you the best medical care.  In doing so we not only want to address your current medical conditions and concerns but also to detect new conditions early and prevent illness, disease and health-related problems.    Medicare offers a yearly Wellness Visit which allows our clinical staff to assess your need for preventative services including immunizations, lifestyle education, counseling to decrease risk of preventable diseases and screening for fall risk and other medical concerns.    This visit is provided free of charge (no copay) for all Medicare recipients. The clinical pharmacists at [facility] have begun to conduct these Wellness Visits which will also include a thorough review of all your medications.    As you primary medical provider recommend that you make an appointment for your Annual Wellness Visit if you have not done so already this year.  You may set up this appointment before you leave today or you may call back [phone] and schedule an appointment.  Please make sure when you call that you mention that you are scheduling your Annual Wellness Visit with the clinical pharmacist so that the appointment may be made for the proper length of time.       Continue current medications. Continue good therapeutic lifestyle changes which include good diet and exercise. Fall precautions discussed with patient. If an FOBT was given today- please return it to our front desk. If you are over 372 years old - you may need Prevnar 13 or the adult Pneumonia vaccine.  Check yourself for ticks on a regular basis Monitor a.m. and p.m. weights as directed Try to drink plenty of water Avoid sodium intake as much as possible    Tick Bite Information Ticks are insects that attach themselves to the skin and draw blood for food. There  are various types of ticks. Common types include wood ticks and deer ticks. Most ticks live in shrubs and grassy areas. Ticks can climb onto your body when you make contact with leaves or grass where the tick is waiting. The most common places on the body for ticks to attach themselves are the scalp, neck, armpits, waist, and groin. Most tick bites are harmless, but sometimes ticks carry germs that cause diseases. These germs can be spread to a person during the tick's feeding process. The chance of a disease spreading through a tick bite depends on:   The type of tick.  Time of year.   How long the tick is attached.   Geographic location.  HOW CAN YOU PREVENT TICK BITES? Take these steps to help prevent tick bites when you are outdoors:  Wear protective clothing. Long sleeves and long pants are best.   Wear white clothes so you can see ticks more easily.  Tuck your pant legs into your socks.   If walking on a trail, stay in the middle of the trail to avoid brushing against bushes.  Avoid walking through areas with long grass.  Put insect repellent on all exposed skin and along boot tops, pant legs, and sleeve cuffs.   Check clothing, hair, and skin repeatedly and before going inside.   Brush off any ticks that are not attached.  Take a shower or bath as soon as possible after being outdoors.  WHAT IS THE PROPER WAY TO REMOVE A TICK? Ticks should be removed as soon as possible to help prevent diseases caused  by tick bites. 1. If latex gloves are available, put them on before trying to remove a tick.  2. Using fine-point tweezers, grasp the tick as close to the skin as possible. You may also use curved forceps or a tick removal tool. Grasp the tick as close to its head as possible. Avoid grasping the tick on its body. 3. Pull gently with steady upward pressure until the tick lets go. Do not twist the tick or jerk it suddenly. This may break off the tick's head or mouth  parts. 4. Do not squeeze or crush the tick's body. This could force disease-carrying fluids from the tick into your body.  5. After the tick is removed, wash the bite area and your hands with soap and water or other disinfectant such as alcohol. 6. Apply a small amount of antiseptic cream or ointment to the bite site.  7. Wash and disinfect any instruments that were used.  Do not try to remove a tick by applying a hot match, petroleum jelly, or fingernail polish to the tick. These methods do not work and may increase the chances of disease being spread from the tick bite.  WHEN SHOULD YOU SEEK MEDICAL CARE? Contact your health care provider if you are unable to remove a tick from your skin or if a part of the tick breaks off and is stuck in the skin.  After a tick bite, you need to be aware of signs and symptoms that could be related to diseases spread by ticks. Contact your health care provider if you develop any of the following in the days or weeks after the tick bite:  Unexplained fever.  Rash. A circular rash that appears days or weeks after the tick bite may indicate the possibility of Lyme disease. The rash may resemble a target with a bull's-eye and may occur at a different part of your body than the tick bite.  Redness and swelling in the area of the tick bite.   Tender, swollen lymph glands.   Diarrhea.   Weight loss.   Cough.   Fatigue.   Muscle, joint, or bone pain.   Abdominal pain.   Headache.   Lethargy or a change in your level of consciousness.  Difficulty walking or moving your legs.   Numbness in the legs.   Paralysis.  Shortness of breath.   Confusion.   Repeated vomiting.  Document Released: 03/14/2000 Document Revised: 01/05/2013 Document Reviewed: 08/25/2012 [facility] Patient Information 2014 [place] [place].

## 2013-08-03 NOTE — Progress Notes (Signed)
Subjective:    Patient ID: Luke Pittman, male    DOB: February 15, 1946, 68 y.o.   MRN: [id number]  HPI Pt here for follow up and management of chronic medical problems. The patient comes in today doing fairly well. He is followed by the cardiologist regularly for his ischemic cardiomyopathy. He does describe some increased fluid retention and shortness of breath which appeared to be more than usual. His previous rate was 185 about 2 months ago it is 171 today. He indicates his weight has been burning quite a bit up and down. He takes extra Lasix and sometimes extra Zaroxolyn. He has had increasing shortness of breath with increased weight gain and generally worse than it was in the past. He also indicates a taking vitamin D raises his blood sugar. His blood sugars at home and been running around 140 fasting.        Patient Active Problem List   Diagnosis Date Noted  . Microcytic anemia 11/20/2011  . Acute on chronic systolic CHF (congestive heart failure) 11/18/2011  . Renal insufficiency 11/18/2011  . Ischemic cardiomyopathy 08/18/2011  . Hyperkalemia   . Chronic systolic CHF (congestive heart failure)   . NIDDM (non-insulin dependent diabetes mellitus)   . DM 07/02/2009  . DYSLIPIDEMIA 07/02/2009  . CAD 07/02/2009   Outpatient Encounter Prescriptions as of 08/03/2013  Medication Sig  . aspirin 81 MG tablet Take 81 mg by mouth daily.  . carvedilol (COREG) 3.125 MG tablet Take 1 tablet (3.125 mg total) by mouth at bedtime.  Marland Kitchen ezetimibe (ZETIA) 10 MG tablet Take 1 tablet (10 mg total) by mouth daily.  . fluticasone (FLONASE) 50 MCG/ACT nasal spray 1-2 sprays each nostril nightly  . furosemide (LASIX) 80 MG tablet Take 1 tablet (80 mg total) by mouth daily.  Marland Kitchen glimepiride (AMARYL) 4 MG tablet TAKE 4MG DAILY  . glucose blood (ACCU-CHEK ADVANTAGE TEST) test strip Use as instructed  . lisinopril (PRINIVIL,ZESTRIL) 5 MG tablet Take 1 tablet (5 mg total) by mouth daily.  . metFORMIN  (GLUCOPHAGE) 500 MG tablet Takes 1 tablet in the morning and 3 tablets in the evening  . metolazone (ZAROXOLYN) 2.5 MG tablet Take one tablet as needed for weight gain of 2 lbs.  . pravastatin (PRAVACHOL) 80 MG tablet Take 1 tablet (80 mg total) by mouth daily.  . Vitamins A & D 5000-400 UNITS CAPS Take 1 tablet by mouth daily.  . [DISCONTINUED] Vitamin D, Ergocalciferol, (DRISDOL) 50000 UNITS CAPS capsule Take 1 capsule (50,000 Units total) by mouth every 7 (seven) days.    Review of Systems  Constitutional: Negative.   HENT: Negative.   Eyes: Negative.   Respiratory: Positive for shortness of breath.   Cardiovascular: Positive for leg swelling (more fluid here lately).  Gastrointestinal: Negative.   Endocrine: Negative.   Genitourinary: Negative.   Musculoskeletal: Negative.   Skin: Negative.   Allergic/Immunologic: Negative.   Neurological: Negative.   Hematological: Negative.   Psychiatric/Behavioral: Negative.        Objective:   Physical Exam  Nursing note and vitals reviewed. Constitutional: He is oriented to person, place, and time. He appears well-developed and well-nourished. No distress.  HENT:  Head: Normocephalic and atraumatic.  Right Ear: External ear normal.  Left Ear: External ear normal.  Nose: Nose normal.  Mouth/Throat: Oropharynx is clear and moist. No oropharyngeal exudate.  Eyes: Conjunctivae and EOM are normal. Pupils are equal, round, and reactive to light. Right eye exhibits no discharge. Left eye exhibits no  discharge. No scleral icterus.  Neck: Normal range of motion. Neck supple. No thyromegaly present.  No carotid bruits  Cardiovascular: Normal rate, regular rhythm, normal heart sounds and intact distal pulses.  Exam reveals no gallop and no friction rub.   No murmur heard. At 72 per minute  Pulmonary/Chest: Effort normal and breath sounds normal. No respiratory distress. He has no wheezes. He has no rales. He exhibits no tenderness.  Abdominal:  Soft. Bowel sounds are normal. He exhibits no mass. There is no tenderness. There is no rebound and no guarding.  Musculoskeletal: Normal range of motion. He exhibits no edema and no tenderness.  Lymphadenopathy:    He has no cervical adenopathy.  Neurological: He is alert and oriented to person, place, and time. He has normal reflexes. No cranial nerve deficit.  Skin: Skin is warm and dry. Rash noted. There is erythema. No pallor.  There was an embedded dog tick above the medial thoracic spine which was removed. There was surrounding erythema. The patient indicates this may have been on there for as long as 2 weeks.  Psychiatric: He has a normal mood and affect. His behavior is normal. Judgment and thought content normal.   BP 103/58  Pulse 84  Temp(Src) 96.3 F (35.7 C) (Oral)  Ht _0  (1.803 m)  Wt 171 lb (77.565 kg)  BMI 23.86 kg/m2        Assessment & Plan:  1. CAD - POCT CBC; Future - BMP8+EGFR; Future - Hepatic function panel; Future  2. DM - POCT CBC; Future - POCT glycosylated hemoglobin (Hb A1C); Future - BMP8+EGFR; Future  3. DYSLIPIDEMIA - POCT CBC; Future - NMR, lipoprofile; Future  4. Vitamin D deficiency - POCT CBC; Future - Vit D  25 hydroxy (rtn osteoporosis monitoring); Future  5. Tick bite - doxycycline (VIBRA-TABS) 100 MG tablet; Take 1 tablet (100 mg total) by mouth 2 (two) times daily.  Dispense: 28 tablet; Refill: 0  6. Fluid retention in legs - adjust furosemide and Zaroxolyn as directed Patient Instructions                       Medicare Annual Wellness Visit  [place] and the medical providers at [place] strive to bring you the best medical care.  In doing so we not only want to address your current medical conditions and concerns but also to detect new conditions early and prevent illness, disease and health-related problems.    Medicare offers a yearly Wellness Visit which allows our clinical staff to assess  your need for preventative services including immunizations, lifestyle education, counseling to decrease risk of preventable diseases and screening for fall risk and other medical concerns.    This visit is provided free of charge (no copay) for all Medicare recipients. The clinical pharmacists at [facility] have begun to conduct these Wellness Visits which will also include a thorough review of all your medications.    As you primary medical provider recommend that you make an appointment for your Annual Wellness Visit if you have not done so already this year.  You may set up this appointment before you leave today or you may call back ([phone]) and schedule an appointment.  Please make sure when you call that you mention that you are scheduling your Annual Wellness Visit with the clinical pharmacist so that the appointment may be made for the proper length of time.  Continue current medications. Continue good therapeutic lifestyle changes which include good diet and exercise. Fall precautions discussed with patient. If an FOBT was given today- please return it to our front desk. If you are over 51 years old - you may need Prevnar 11 or the adult Pneumonia vaccine.  Check yourself for ticks on a regular basis Monitor a.m. and p.m. weights as directed Try to drink plenty of water Avoid sodium intake as much as possible    Tick Bite Information Ticks are insects that attach themselves to the skin and draw blood for food. There are various types of ticks. Common types include wood ticks and deer ticks. Most ticks live in shrubs and grassy areas. Ticks can climb onto your body when you make contact with leaves or grass where the tick is waiting. The most common places on the body for ticks to attach themselves are the scalp, neck, armpits, waist, and groin. Most tick bites are harmless, but sometimes ticks carry germs that cause diseases. These germs can be spread  to a person during the tick's feeding process. The chance of a disease spreading through a tick bite depends on:   The type of tick.  Time of year.   How long the tick is attached.   Geographic location.  HOW CAN YOU PREVENT TICK BITES? Take these steps to help prevent tick bites when you are outdoors:  Wear protective clothing. Long sleeves and long pants are best.   Wear white clothes so you can see ticks more easily.  Tuck your pant legs into your socks.   If walking on a trail, stay in the middle of the trail to avoid brushing against bushes.  Avoid walking through areas with long grass.  Put insect repellent on all exposed skin and along boot tops, pant legs, and sleeve cuffs.   Check clothing, hair, and skin repeatedly and before going inside.   Brush off any ticks that are not attached.  Take a shower or bath as soon as possible after being outdoors.  WHAT IS THE PROPER WAY TO REMOVE A TICK? Ticks should be removed as soon as possible to help prevent diseases caused by tick bites. 1. If latex gloves are available, put them on before trying to remove a tick.  2. Using fine-point tweezers, grasp the tick as close to the skin as possible. You may also use curved forceps or a tick removal tool. Grasp the tick as close to its head as possible. Avoid grasping the tick on its body. 3. Pull gently with steady upward pressure until the tick lets go. Do not twist the tick or jerk it suddenly. This may break off the tick's head or mouth parts. 4. Do not squeeze or crush the tick's body. This could force disease-carrying fluids from the tick into your body.  5. After the tick is removed, wash the bite area and your hands with soap and water or other disinfectant such as alcohol. 6. Apply a small amount of antiseptic cream or ointment to the bite site.  7. Wash and disinfect any instruments that were used.  Do not try to remove a tick by applying a hot match, petroleum  jelly, or fingernail polish to the tick. These methods do not work and may increase the chances of disease being spread from the tick bite.  WHEN SHOULD YOU SEEK MEDICAL CARE? Contact your health care provider if you are unable to remove a tick from your skin or if a part  of the tick breaks off and is stuck in the skin.  After a tick bite, you need to be aware of signs and symptoms that could be related to diseases spread by ticks. Contact your health care provider if you develop any of the following in the days or weeks after the tick bite:  Unexplained fever.  Rash. A circular rash that appears days or weeks after the tick bite may indicate the possibility of Lyme disease. The rash may resemble a target with a bull's-eye and may occur at a different part of your body than the tick bite.  Redness and swelling in the area of the tick bite.   Tender, swollen lymph glands.   Diarrhea.   Weight loss.   Cough.   Fatigue.   Muscle, joint, or bone pain.   Abdominal pain.   Headache.   Lethargy or a change in your level of consciousness.  Difficulty walking or moving your legs.   Numbness in the legs.   Paralysis.  Shortness of breath.   Confusion.   Repeated vomiting.  Document Released: 03/14/2000 Document Revised: 01/05/2013 Document Reviewed: 08/25/2012 [facility] Patient Information 2014 [place].    Arrie Senate MD

## 2013-08-03 NOTE — Addendum Note (Signed)
Addended by: Magdalene RiverBULLINS, Leianne Callins H on: 08/03/2013 09:41 AM   Modules accepted: Orders

## 2013-08-04 ENCOUNTER — Other Ambulatory Visit (INDEPENDENT_AMBULATORY_CARE_PROVIDER_SITE_OTHER): Payer: Medicare HMO

## 2013-08-04 DIAGNOSIS — E119 Type 2 diabetes mellitus without complications: Secondary | ICD-10-CM

## 2013-08-04 DIAGNOSIS — I2510 Atherosclerotic heart disease of native coronary artery without angina pectoris: Secondary | ICD-10-CM

## 2013-08-04 DIAGNOSIS — W57XXXA Bitten or stung by nonvenomous insect and other nonvenomous arthropods, initial encounter: Secondary | ICD-10-CM

## 2013-08-04 DIAGNOSIS — E785 Hyperlipidemia, unspecified: Secondary | ICD-10-CM

## 2013-08-04 DIAGNOSIS — E559 Vitamin D deficiency, unspecified: Secondary | ICD-10-CM

## 2013-08-04 LAB — POCT CBC
Granulocyte percent: 66.6 % (ref 37–80)
HCT, POC: 34.7 % — AB (ref 43.5–53.7)
Hemoglobin: 10.6 g/dL — AB (ref 14.1–18.1)
Lymph, poc: 2 (ref 0.6–3.4)
MCH, POC: 25.7 pg — AB (ref 27–31.2)
MCHC: 30.7 g/dL — AB (ref 31.8–35.4)
MCV: 83.8 fL (ref 80–97)
MPV: 8.8 fL (ref 0–99.8)
POC Granulocyte: 4.6 (ref 2–6.9)
POC LYMPH PERCENT: 29 % (ref 10–50)
Platelet Count, POC: 188 10*3/uL (ref 142–424)
RBC: 4.1 M/uL — AB (ref 4.69–6.13)
RDW, POC: 14.5 %
WBC: 6.9 10*3/uL (ref 4.6–10.2)

## 2013-08-04 LAB — POCT GLYCOSYLATED HEMOGLOBIN (HGB A1C): Hemoglobin A1C: 7.7

## 2013-08-04 NOTE — Progress Notes (Signed)
Pt came in for labs only 

## 2013-08-06 LAB — BMP8+EGFR
BUN / CREAT RATIO: 33 — AB (ref 10–22)
BUN: 49 mg/dL — AB (ref 8–27)
CO2: 27 mmol/L (ref 18–29)
Calcium: 9.2 mg/dL (ref 8.6–10.2)
Chloride: 95 mmol/L — ABNORMAL LOW (ref 97–108)
Creatinine, Ser: 1.5 mg/dL — ABNORMAL HIGH (ref 0.76–1.27)
GFR, EST AFRICAN AMERICAN: 55 mL/min/{1.73_m2} — AB (ref >59)
GFR, EST NON AFRICAN AMERICAN: 47 mL/min/{1.73_m2} — AB (ref >59)
GLUCOSE: 85 mg/dL (ref 65–99)
Potassium: 5.1 mmol/L (ref 3.5–5.2)
Sodium: 138 mmol/L (ref 134–144)

## 2013-08-06 LAB — ROCKY MTN SPOTTED FVR ABS PNL(IGG+IGM)
RMSF IgG: NEGATIVE
RMSF IgM: 0.41 index (ref 0.00–0.89)

## 2013-08-06 LAB — VITAMIN D 25 HYDROXY (VIT D DEFICIENCY, FRACTURES): Vit D, 25-Hydroxy: 41 ng/mL (ref 30.0–100.0)

## 2013-08-06 LAB — LYME AB/WESTERN BLOT REFLEX: LYME DISEASE AB, QUANT, IGM: <0.8 index (ref 0.00–0.79)

## 2013-08-06 LAB — HEPATIC FUNCTION PANEL
ALBUMIN: 4.2 g/dL (ref 3.6–4.8)
ALK PHOS: 43 IU/L (ref 39–117)
ALT: 23 IU/L (ref 0–44)
AST: 24 IU/L (ref 0–40)
Bilirubin, Direct: 0.14 mg/dL (ref 0.00–0.40)
TOTAL PROTEIN: 6.1 g/dL (ref 6.0–8.5)
Total Bilirubin: 0.4 mg/dL (ref 0.0–1.2)

## 2013-08-06 LAB — NMR, LIPOPROFILE
CHOLESTEROL: 127 mg/dL (ref <200)
HDL Cholesterol by NMR: 42 mg/dL (ref >=40)
HDL Particle Number: 30.7 umol/L (ref >=30.5)
LDL Particle Number: 948 nmol/L (ref <1000)
LDL SIZE: 20.4 nm (ref >20.5)
LDLC SERPL CALC-MCNC: 70 mg/dL (ref <100)
LP-IR Score: 71 — ABNORMAL HIGH (ref <=45)
Small LDL Particle Number: 482 nmol/L (ref <=527)
Triglycerides by NMR: 77 mg/dL (ref <150)

## 2013-10-03 ENCOUNTER — Ambulatory Visit (INDEPENDENT_AMBULATORY_CARE_PROVIDER_SITE_OTHER): Payer: Medicare HMO

## 2013-10-03 ENCOUNTER — Ambulatory Visit (INDEPENDENT_AMBULATORY_CARE_PROVIDER_SITE_OTHER): Payer: Medicare HMO | Admitting: Family Medicine

## 2013-10-03 ENCOUNTER — Encounter: Payer: Self-pay | Admitting: Family Medicine

## 2013-10-03 ENCOUNTER — Encounter (HOSPITAL_COMMUNITY): Payer: Self-pay | Admitting: General Practice

## 2013-10-03 ENCOUNTER — Inpatient Hospital Stay (HOSPITAL_COMMUNITY)
Admission: AD | Admit: 2013-10-03 | Discharge: 2013-10-05 | DRG: 292 | Disposition: A | Discharge status: Home / Self Care | Payer: Medicare HMO | Source: Ambulatory Visit | Attending: Internal Medicine | Admitting: Internal Medicine

## 2013-10-03 ENCOUNTER — Telehealth: Payer: Self-pay | Admitting: Cardiology

## 2013-10-03 VITALS — BP 110/78 | HR 80 | Temp 97.0°F | Ht 71.0 in | Wt 185.6 lb

## 2013-10-03 DIAGNOSIS — R635 Abnormal weight gain: Secondary | ICD-10-CM

## 2013-10-03 DIAGNOSIS — I2589 Other forms of chronic ischemic heart disease: Secondary | ICD-10-CM | POA: Diagnosis present

## 2013-10-03 DIAGNOSIS — I255 Ischemic cardiomyopathy: Secondary | ICD-10-CM

## 2013-10-03 DIAGNOSIS — Z8249 Family history of ischemic heart disease and other diseases of the circulatory system: Secondary | ICD-10-CM

## 2013-10-03 DIAGNOSIS — E1129 Type 2 diabetes mellitus with other diabetic kidney complication: Secondary | ICD-10-CM | POA: Diagnosis present

## 2013-10-03 DIAGNOSIS — Z791 Long term (current) use of non-steroidal anti-inflammatories (NSAID): Secondary | ICD-10-CM

## 2013-10-03 DIAGNOSIS — Z887 Allergy status to serum and vaccine status: Secondary | ICD-10-CM

## 2013-10-03 DIAGNOSIS — I2510 Atherosclerotic heart disease of native coronary artery without angina pectoris: Secondary | ICD-10-CM

## 2013-10-03 DIAGNOSIS — R64 Cachexia: Secondary | ICD-10-CM | POA: Diagnosis present

## 2013-10-03 DIAGNOSIS — E1122 Type 2 diabetes mellitus with diabetic chronic kidney disease: Secondary | ICD-10-CM | POA: Diagnosis present

## 2013-10-03 DIAGNOSIS — Z79899 Other long term (current) drug therapy: Secondary | ICD-10-CM

## 2013-10-03 DIAGNOSIS — N289 Disorder of kidney and ureter, unspecified: Secondary | ICD-10-CM

## 2013-10-03 DIAGNOSIS — I509 Heart failure, unspecified: Secondary | ICD-10-CM

## 2013-10-03 DIAGNOSIS — N183 Chronic kidney disease, stage 3 unspecified: Secondary | ICD-10-CM

## 2013-10-03 DIAGNOSIS — R0602 Shortness of breath: Secondary | ICD-10-CM

## 2013-10-03 DIAGNOSIS — I5023 Acute on chronic systolic (congestive) heart failure: Principal | ICD-10-CM

## 2013-10-03 DIAGNOSIS — E785 Hyperlipidemia, unspecified: Secondary | ICD-10-CM | POA: Diagnosis present

## 2013-10-03 DIAGNOSIS — E1121 Type 2 diabetes mellitus with diabetic nephropathy: Secondary | ICD-10-CM

## 2013-10-03 DIAGNOSIS — Z888 Allergy status to other drugs, medicaments and biological substances status: Secondary | ICD-10-CM

## 2013-10-03 DIAGNOSIS — N058 Unspecified nephritic syndrome with other morphologic changes: Secondary | ICD-10-CM | POA: Diagnosis present

## 2013-10-03 DIAGNOSIS — Z87891 Personal history of nicotine dependence: Secondary | ICD-10-CM

## 2013-10-03 DIAGNOSIS — Z7982 Long term (current) use of aspirin: Secondary | ICD-10-CM

## 2013-10-03 DIAGNOSIS — Z6825 Body mass index (BMI) 25.0-25.9, adult: Secondary | ICD-10-CM

## 2013-10-03 DIAGNOSIS — Z951 Presence of aortocoronary bypass graft: Secondary | ICD-10-CM

## 2013-10-03 DIAGNOSIS — I5022 Chronic systolic (congestive) heart failure: Secondary | ICD-10-CM

## 2013-10-03 HISTORY — DX: Cerebral infarction, unspecified: I63.9

## 2013-10-03 HISTORY — DX: Type 2 diabetes mellitus without complications: E11.9

## 2013-10-03 HISTORY — DX: Unspecified hearing loss, left ear: H91.92

## 2013-10-03 LAB — PRO B NATRIURETIC PEPTIDE: Pro B Natriuretic peptide (BNP): 6626 pg/mL — ABNORMAL HIGH (ref 0–125)

## 2013-10-03 LAB — POCT CBC
GRANULOCYTE PERCENT: 70.7 % (ref 37–80)
HEMATOCRIT: 30.7 % — AB (ref 43.5–53.7)
Hemoglobin: 9.6 g/dL — AB (ref 14.1–18.1)
Lymph, poc: 1.8 (ref 0.6–3.4)
MCH, POC: 24.6 pg — AB (ref 27–31.2)
MCHC: 31.3 g/dL — AB (ref 31.8–35.4)
MCV: 78.6 fL — AB (ref 80–97)
MPV: 8.3 fL (ref 0–99.8)
POC Granulocyte: 5.2 (ref 2–6.9)
POC LYMPH PERCENT: 25.2 %L (ref 10–50)
Platelet Count, POC: 145 10*3/uL (ref 142–424)
RBC: 3.9 M/uL — AB (ref 4.69–6.13)
RDW, POC: 16.7 %
WBC: 7.3 10*3/uL (ref 4.6–10.2)

## 2013-10-03 LAB — COMPREHENSIVE METABOLIC PANEL
ALT: 20 U/L (ref 0–53)
ANION GAP: 16 — AB (ref 5–15)
AST: 29 U/L (ref 0–37)
Albumin: 4 g/dL (ref 3.5–5.2)
Alkaline Phosphatase: 61 U/L (ref 39–117)
BILIRUBIN TOTAL: 0.8 mg/dL (ref 0.3–1.2)
BUN: 49 mg/dL — AB (ref 6–23)
CHLORIDE: 91 meq/L — AB (ref 96–112)
CO2: 29 mEq/L (ref 19–32)
Calcium: 10.1 mg/dL (ref 8.4–10.5)
Creatinine, Ser: 1.67 mg/dL — ABNORMAL HIGH (ref 0.50–1.35)
GFR calc non Af Amer: 41 mL/min — ABNORMAL LOW (ref >90)
GFR, EST AFRICAN AMERICAN: 47 mL/min — AB (ref >90)
Glucose, Bld: 116 mg/dL — ABNORMAL HIGH (ref 70–99)
Potassium: 4.5 mEq/L (ref 3.7–5.3)
SODIUM: 136 meq/L — AB (ref 137–147)
Total Protein: 7 g/dL (ref 6.0–8.3)

## 2013-10-03 MED ORDER — FLUTICASONE PROPIONATE 50 MCG/ACT NA SUSP
1.0000 | Freq: Every day | NASAL | Status: DC
  Administered 2013-10-04 – 2013-10-05 (×2): 1 via NASAL
  Filled 2013-10-03: qty 16

## 2013-10-03 MED ORDER — SIMVASTATIN 40 MG PO TABS: 40.0000 mg | ORAL_TABLET | Freq: Every day | ORAL | Status: DC

## 2013-10-03 MED ORDER — ACETAMINOPHEN 500 MG PO TABS
500.0000 mg | ORAL_TABLET | Freq: Once | ORAL | Status: AC
  Administered 2013-10-03: 500 mg via ORAL
  Filled 2013-10-03: qty 1

## 2013-10-03 MED ORDER — EZETIMIBE 10 MG PO TABS
10.0000 mg | ORAL_TABLET | Freq: Every day | ORAL | Status: DC
  Administered 2013-10-03: 10 mg via ORAL
  Filled 2013-10-03 (×2): qty 1

## 2013-10-03 MED ORDER — CARVEDILOL 3.125 MG PO TABS
3.1250 mg | ORAL_TABLET | Freq: Two times a day (BID) | ORAL | Status: DC
  Administered 2013-10-04 – 2013-10-05 (×2): 3.125 mg via ORAL
  Filled 2013-10-03 (×4): qty 1

## 2013-10-03 MED ORDER — ASPIRIN EC 81 MG PO TBEC
81.0000 mg | DELAYED_RELEASE_TABLET | Freq: Every day | ORAL | Status: DC
  Administered 2013-10-04 – 2013-10-05 (×2): 81 mg via ORAL
  Filled 2013-10-03 (×3): qty 1

## 2013-10-03 MED ORDER — GLIMEPIRIDE 4 MG PO TABS
4.0000 mg | ORAL_TABLET | Freq: Every day | ORAL | Status: DC
  Administered 2013-10-04 – 2013-10-05 (×2): 4 mg via ORAL
  Filled 2013-10-03 (×3): qty 1

## 2013-10-03 MED ORDER — LISINOPRIL 5 MG PO TABS
5.0000 mg | ORAL_TABLET | Freq: Every day | ORAL | Status: DC
  Filled 2013-10-03 (×2): qty 1

## 2013-10-03 MED ORDER — SIMVASTATIN 40 MG PO TABS
40.0000 mg | ORAL_TABLET | Freq: Every day | ORAL | Status: DC
  Administered 2013-10-03 – 2013-10-04 (×2): 40 mg via ORAL
  Filled 2013-10-03 (×3): qty 1

## 2013-10-03 MED ORDER — FUROSEMIDE 10 MG/ML IJ SOLN
100.0000 mg | Freq: Two times a day (BID) | INTRAVENOUS | Status: DC
  Administered 2013-10-03: 100 mg via INTRAVENOUS
  Filled 2013-10-03 (×3): qty 10

## 2013-10-03 MED ORDER — METFORMIN HCL 500 MG PO TABS
500.0000 mg | ORAL_TABLET | Freq: Every day | ORAL | Status: DC
  Administered 2013-10-04 – 2013-10-05 (×2): 500 mg via ORAL
  Filled 2013-10-03 (×3): qty 1

## 2013-10-03 NOTE — Telephone Encounter (Signed)
New message    Patient calling since 6/24  Weight has increase . Today  182.2 yesterday 183.

## 2013-10-03 NOTE — Patient Instructions (Signed)
We will arrange for you to be admitted to the hospital for help with diuresis

## 2013-10-03 NOTE — Progress Notes (Signed)
Subjective:    Patient ID: Luke Pittman, male    DOB: 1945-09-15, 68 y.o.   MRN: [id number]  HPI Pt is here today for weight gain of 10 pounds and shortness of breath. He denies chest pain or chest tightness. He denies PND. He does say that he has more swelling in his feet and that they're painful. Up to about one week ago he was only taking 80 mg of Lasix a day. He's been taking 160 mg of Lasix for one week. He has also been taking Zaroxolyn everyday 2-1/2 mg. Before one week ago he was just taking it occasionally. He has gained about 10 pounds of weight  over the past 2 weeks.   Review of Systems  Constitutional: Positive for unexpected weight change (wt gain of 10 lbs).  HENT: Negative.   Eyes: Negative.   Respiratory: Positive for shortness of breath.   Cardiovascular: Positive for leg swelling. Negative for chest pain.  Gastrointestinal: Positive for constipation. Negative for abdominal pain, blood in stool and anal bleeding.  Genitourinary: Negative.        Objective:   Physical Exam  Nursing note and vitals reviewed. Constitutional: He is oriented to person, place, and time.  The patient is thin and somewhat frail looking for his age. His color is pale. His lips have pallor.  HENT:  Head: Normocephalic and atraumatic.  Mouth/Throat: Oropharynx is clear and moist. No oropharyngeal exudate.  Eyes: Conjunctivae and EOM are normal. Pupils are equal, round, and reactive to light. Right eye exhibits no discharge. Left eye exhibits no discharge. No scleral icterus.  There appears to be some mild. Orbital swelling  Neck: Normal range of motion. Neck supple. No thyromegaly present.  Cardiovascular: Normal rate, regular rhythm and normal heart sounds.  Exam reveals no gallop and no friction rub.   No murmur heard. At 84 per minute  Pulmonary/Chest: Effort normal and breath sounds normal. No respiratory distress. He has no wheezes. He has no rales. He exhibits no tenderness.    Abdominal: Soft. Bowel sounds are normal. He exhibits no mass. There is no tenderness. There is no rebound and no guarding.  Gaseous distention mild without tenderness  Musculoskeletal: Normal range of motion. He exhibits edema. He exhibits no tenderness.  Plus pretibial edema. No sacral edema  Lymphadenopathy:    He has no cervical adenopathy.  Neurological: He is alert and oriented to person, place, and time.  Skin: Skin is warm and dry. No rash noted. No erythema. No pallor.  Psychiatric: He has a normal mood and affect. His behavior is normal. Judgment and thought content normal.   BP 110/78  Pulse 80  Temp(Src) 97 F (36.1 C) (Oral)  Ht $R'5\' 11"'MZ$  (1.803 m)  Wt 185 lb 9.6 oz (84.188 kg)  BMI 25.90 kg/m2  [facility] reading (PRIMARY) by  Dr. Brunilda Payor x-ray-heart is more enlarged than on previous x-ray in May and there may be some mild infiltrate in the left base.                                  Results for orders placed in visit on 10/03/13  POCT CBC      Result Value Ref Range   WBC 7.3  4.6 - 10.2 K/uL   Lymph, poc 1.8  0.6 - 3.4   POC LYMPH PERCENT 25.2  10 - 50 %L   POC Granulocyte 5.2  2 -  6.9   Granulocyte percent 70.7  37 - 80 %G   RBC 3.9 (*) 4.69 - 6.13 M/uL   Hemoglobin 9.6 (*) 14.1 - 18.1 g/dL   HCT, POC 30.7 (*) 43.5 - 53.7 %   MCV 78.6 (*) 80 - 97 fL   MCH, POC 24.6 (*) 27 - 31.2 pg   MCHC 31.3 (*) 31.8 - 35.4 g/dL   RDW, POC 16.7     Platelet Count, POC 145.0  142 - 424 K/uL   MPV 8.3  0 - 99.8 fL   I spoke with Dr. Dorris Carnes and she is going to arrange an admission to give him some IV diuretics in the hospital      Assessment & Plan:  1. Weight gain - BMP8+EGFR - Brain natriuretic peptide - Hepatic function panel - DG Chest 2 View  2. SOB (shortness of breath) - POCT CBC - DG Chest 2 View  3. Acute congestive heart failure, unspecified congestive heart failure type  4. Coronary atherosclerosis of unspecified type of vessel, native or  graft  5.diabetes mellitus  Patient Instructions  We will arrange for you to be admitted to the hospital for help with diuresis   Arrie Senate MD

## 2013-10-03 NOTE — Telephone Encounter (Signed)
Spoke with pt who reports since 6/24 his wt has been steadily increasing.  He started out at 174 lbs that day.  Yesterday he was 184 lbs and now today he is 182 lbs.  He reports bilateral edema and SOB.  He has taken 160 mg (approximately) of Furosemide every day for the last 11 days and has also been taking Metolazone 2.5 mg (15 in the last 11 days).  Advised pt that he needs to be seen ASAP, be evaluated and have blood work (d/t history of renal insuffiencey).  Advised since Dr Antoine PocheHochrein is not in the office he should call [facility] to be worked in as further medication adjustments can not be made without knowing his electrolyte levels and kidney functions.  He states understanding and will call them.

## 2013-10-03 NOTE — H&P (Signed)
Primary Physician:  [name] Primary Cardiologist:  Pittman   HPI: Patient is a 68 yo  With a history of CAD and ischemic cardiomyopathy  Last seen by Luke Pittman In March.  He is also followed by Luke Pittman Patient reports he has been doing OK until the past 2 wks  He denies CP  Has developed increased LE edema. Has been more SOB  Denies change in diet  Says his appetite is good. The patient over the past week has been taking 160 lasix  Along with 2.5 mg zaroxyyn daily  Prior to this he was taking Zaroxylyn prn.          Past Medical History  Diagnosis Date  . CAD (coronary artery disease)     a. s/p CABG 1994;  b.  Patent grafts 07/2011  . Chronic systolic CHF (congestive heart failure)     a. EF 25% in 2005;  b. Echo 07/2011: EF 20-25%  . Anemia   . NIDDM (non-insulin dependent diabetes mellitus)   . Hyperlipidemia   . Hyperkalemia     a. 07/2011 ->acei d/c'd.  . Ischemic cardiomyopathy     Medications Prior to Admission  Medication Sig Dispense Refill  . aspirin 81 MG tablet Take 81 mg by mouth daily.      . carvedilol (COREG) 3.125 MG tablet Take 1 tablet (3.125 mg total) by mouth at bedtime.  90 tablet  3  . ezetimibe (ZETIA) 10 MG tablet Take 1 tablet (10 mg total) by mouth daily.  90 tablet  3  . fluticasone (FLONASE) 50 MCG/ACT nasal spray 1-2 sprays each nostril nightly  16 g  6  . furosemide (LASIX) 80 MG tablet Take 1 tablet (80 mg total) by mouth daily.  90 tablet  3  . glimepiride (AMARYL) 4 MG tablet TAKE 4MG  DAILY  90 tablet  3  . glucose blood (ACCU-CHEK ADVANTAGE TEST) test strip Use as instructed  100 each  12  . lisinopril (PRINIVIL,ZESTRIL) 5 MG tablet Take 1 tablet (5 mg total) by mouth daily.  90 tablet  3  . metFORMIN (GLUCOPHAGE) 500 MG tablet Takes 1 tablet in the morning and 3 tablets in the evening  360 tablet  3  . metolazone (ZAROXOLYN) 2.5 MG tablet Take one tablet as needed for weight gain of 2 lbs.  90 tablet  0  . pravastatin (PRAVACHOL) 80 MG  tablet Take 1 tablet (80 mg total) by mouth daily.  90 tablet  3  . Vitamins A & D 5000-400 UNITS CAPS Take 1 tablet by mouth daily.         Marland Kitchen. aspirin EC  81 mg Oral Daily  . [START ON 10/04/2013] carvedilol  3.125 mg Oral BID WC  . furosemide  100 mg Intravenous Q12H  . lisinopril  5 mg Oral Daily    Infusions:    Allergies  Allergen Reactions  . Beta Adrenergic Blockers     Swollen hands  . Fish Oil Diarrhea  . Januvia [Sitagliptin Phosphate] Other (See Comments)    Leg cramps  . Tetanus-Diphtheria Toxoids Td Swelling    History   Social History  . Marital Status: Married    Spouse Name: N/A    Number of Children: 2  . Years of Education: N/A   Occupational History  . Not on file.   Social History Main Topics  . Smoking status: Former Smoker    Types: Cigarettes    Quit date: 08/26/1961  .  Smokeless tobacco: Never Used     Comment: quit 40 years ago  . Alcohol Use: No  . Drug Use: No  . Sexual Activity: No   Other Topics Concern  . Not on file   Social History Narrative   [place] in [place], [place] with [place].     Family History  Problem Relation Age of Onset  . Heart failure Mother   . Heart attack Father [id number]  . Heart attack Brother 242    Deceased at 6746 from heart failure    REVIEW OF SYSTEMS:  All systems reviewed  Negative to the above problem except as noted above.    PHYSICAL EXAM: There were no vitals filed for this visit.  No intake or output data in the 24 hours ending 10/03/13 1804  General:  Well appearing. No respiratory difficulty HEENT: normal Neck: supple. JVP is increase . Carotids 2+ bilat; no bruits. No lymphadenopathy or thryomegaly appreciated. Cor: PMI nondisplaced. Regular rate & rhythm. No rubs, gallops or murmurs. Lungs: rales at bilateral bases.   Abdomen: soft, nontender, nondistended. No hepatosplenomegaly. No bruits or masses. Good bowel sounds. Extremities: no cyanosis, clubbing, rash.  1-2+ edema Neuro: alert & oriented x  3, cranial nerves grossly intact. moves all 4 extremities w/o difficulty. Affect pleasant.  ECG:  pending  Results for orders placed in visit on 10/03/13 (from the past 24 hour(s))  POCT CBC     Status: Abnormal   Collection Time    10/03/13  2:28 PM      Result Value Ref Range   WBC 7.3  4.6 - 10.2 K/uL   Lymph, poc 1.8  0.6 - 3.4   POC LYMPH PERCENT 25.2  10 - 50 %L   POC Granulocyte 5.2  2 - 6.9   Granulocyte percent 70.7  37 - 80 %G   RBC 3.9 (*) 4.69 - 6.13 M/uL   Hemoglobin 9.6 (*) 14.1 - 18.1 g/dL   HCT, POC 40.930.7 (*) 81.143.5 - 53.7 %   MCV 78.6 (*) 80 - 97 fL   MCH, POC 24.6 (*) 27 - 31.2 pg   MCHC 31.3 (*) 31.8 - 35.4 g/dL   RDW, POC 91.416.7     Platelet Count, POC 145.0  142 - 424 K/uL   MPV 8.3  0 - 99.8 fL   Dg Chest 2 View  10/03/2013   CLINICAL DATA:  sob  EXAM: CHEST  2 VIEW  COMPARISON:  Two-view chest 08/03/2013.  FINDINGS: Low lung volumes. Stable cardiomegaly. Is mild prominence of the interstitial markings and mild central peribronchial cuffing. No focal regions of consolidation. No acute osseous abnormalities.  IMPRESSION: Pulmonary vascular congestion/mild edema. No focal regions of consolidation or focal infiltrates.   Electronically Signed   By: Salome HolmesHector  Cooper M.D.   On: 10/03/2013 14:45     ASSESSMENT: Patient is a 68 yo with known systolic CHF and and CAD. Presents with 10# wt increase as well as increased edema.  This has not responded to an increase in lasix.   Will begin IV lasix Titrate as needed based on response COntinue home meds. Follow labs. I am not sure what has caused this exacerbation.  I am not convinced of active ischemia.  I would not sched echo now.  It will not change current plans     2.  DM  Follow glucose  3.  HL  Continue statin.

## 2013-10-04 DIAGNOSIS — E1129 Type 2 diabetes mellitus with other diabetic kidney complication: Secondary | ICD-10-CM

## 2013-10-04 DIAGNOSIS — I5023 Acute on chronic systolic (congestive) heart failure: Principal | ICD-10-CM

## 2013-10-04 DIAGNOSIS — N183 Chronic kidney disease, stage 3 unspecified: Secondary | ICD-10-CM

## 2013-10-04 DIAGNOSIS — I2589 Other forms of chronic ischemic heart disease: Secondary | ICD-10-CM

## 2013-10-04 LAB — BASIC METABOLIC PANEL
Anion gap: 14 (ref 5–15)
BUN: 44 mg/dL — AB (ref 6–23)
CHLORIDE: 91 meq/L — AB (ref 96–112)
CO2: 32 meq/L (ref 19–32)
Calcium: 9.6 mg/dL (ref 8.4–10.5)
Creatinine, Ser: 1.67 mg/dL — ABNORMAL HIGH (ref 0.50–1.35)
GFR calc Af Amer: 47 mL/min — ABNORMAL LOW (ref >90)
GFR calc non Af Amer: 41 mL/min — ABNORMAL LOW (ref >90)
Glucose, Bld: 135 mg/dL — ABNORMAL HIGH (ref 70–99)
Potassium: 4.1 mEq/L (ref 3.7–5.3)
Sodium: 137 mEq/L (ref 137–147)

## 2013-10-04 LAB — BMP8+EGFR
BUN/Creatinine Ratio: 28 — ABNORMAL HIGH (ref 10–22)
BUN: 48 mg/dL — ABNORMAL HIGH (ref 8–27)
CO2: 27 mmol/L (ref 18–29)
Calcium: 9.6 mg/dL (ref 8.6–10.2)
Chloride: 92 mmol/L — ABNORMAL LOW (ref 97–108)
Creatinine, Ser: 1.72 mg/dL — ABNORMAL HIGH (ref 0.76–1.27)
GFR calc non Af Amer: 40 mL/min/{1.73_m2} — ABNORMAL LOW (ref >59)
GFR, EST AFRICAN AMERICAN: 47 mL/min/{1.73_m2} — AB (ref >59)
Glucose: 69 mg/dL (ref 65–99)
POTASSIUM: 4.9 mmol/L (ref 3.5–5.2)
Sodium: 138 mmol/L (ref 134–144)

## 2013-10-04 LAB — HEPATIC FUNCTION PANEL
ALK PHOS: 57 IU/L (ref 39–117)
ALT: 18 IU/L (ref 0–44)
AST: 19 IU/L (ref 0–40)
Albumin: 4.3 g/dL (ref 3.6–4.8)
BILIRUBIN DIRECT: 0.27 mg/dL (ref 0.00–0.40)
TOTAL PROTEIN: 5.4 g/dL — AB (ref 6.0–8.5)
Total Bilirubin: 0.6 mg/dL (ref 0.0–1.2)

## 2013-10-04 LAB — GLUCOSE, CAPILLARY
GLUCOSE-CAPILLARY: 170 mg/dL — AB (ref 70–99)
GLUCOSE-CAPILLARY: 194 mg/dL — AB (ref 70–99)
GLUCOSE-CAPILLARY: 226 mg/dL — AB (ref 70–99)
Glucose-Capillary: 125 mg/dL — ABNORMAL HIGH (ref 70–99)

## 2013-10-04 LAB — BRAIN NATRIURETIC PEPTIDE

## 2013-10-04 MED ORDER — LISINOPRIL 2.5 MG PO TABS
2.5000 mg | ORAL_TABLET | Freq: Every day | ORAL | Status: DC
  Administered 2013-10-04 – 2013-10-05 (×2): 2.5 mg via ORAL
  Filled 2013-10-04 (×2): qty 1

## 2013-10-04 MED ORDER — FUROSEMIDE 10 MG/ML IJ SOLN
100.0000 mg | Freq: Two times a day (BID) | INTRAVENOUS | Status: AC
  Administered 2013-10-04: 100 mg via INTRAVENOUS
  Filled 2013-10-04: qty 10

## 2013-10-04 MED ORDER — HYDROCODONE-ACETAMINOPHEN 5-325 MG PO TABS: 1.0000 | ORAL_TABLET | Freq: Once | ORAL | Status: DC

## 2013-10-04 MED ORDER — LISINOPRIL 2.5 MG PO TABS: 2.5000 mg | ORAL_TABLET | Freq: Every day | ORAL | Status: DC

## 2013-10-04 MED ORDER — EZETIMIBE 10 MG PO TABS
10.0000 mg | ORAL_TABLET | Freq: Every day | ORAL | Status: DC
  Administered 2013-10-04: 10 mg via ORAL
  Filled 2013-10-04 (×2): qty 1

## 2013-10-04 MED ORDER — FUROSEMIDE 10 MG/ML IJ SOLN
100.0000 mg | Freq: Two times a day (BID) | INTRAMUSCULAR | Status: DC
  Administered 2013-10-04: 100 mg via INTRAVENOUS
  Filled 2013-10-04 (×2): qty 10

## 2013-10-04 MED ORDER — BUMETANIDE 2 MG PO TABS
4.0000 mg | ORAL_TABLET | Freq: Two times a day (BID) | ORAL | Status: DC
  Filled 2013-10-04 (×3): qty 2

## 2013-10-04 NOTE — Progress Notes (Signed)
    Subjective:  Less SOB this am  Objective:  Vital Signs in the last 24 hours: Temp:  [97 F (36.1 C)-97.5 F (36.4 C)] 97.4 F (36.3 C) (07/07 0626) Pulse Rate:  [80-102] 102 (07/07 0626) Resp:  [16-20] 20 (07/07 0626) BP: (110-142)/(62-78) 110/67 mmHg (07/07 0626) SpO2:  [97 %-100 %] 100 % (07/07 0626) Weight:  [173 lb 1.6 oz (78.518 kg)-185 lb 9.6 oz (84.188 kg)] 173 lb 1.6 oz (78.518 kg) (07/07 0626)  Intake/Output from previous day:  Intake/Output Summary (Last 24 hours) at 10/04/13 1106 Last data filed at 10/04/13 1050  Gross per 24 hour  Intake      0 ml  Output   3800 ml  Net  -3800 ml    Physical Exam: General appearance: alert, cooperative, cachectic and no distress Lungs: clear to auscultation bilaterally Heart: regular rate and rhythm and S4 present Extremities: 1+ bilateral LE edema   Rate: 100  Rhythm: normal sinus rhythm and sinus tachycardia  Lab Results:  Recent Labs  10/03/13 1428  WBC 7.3  HGB 9.6*    Recent Labs  10/03/13 1758 10/04/13 0720  NA 136* 137  K 4.5 4.1  CL 91* 91*  CO2 29 32  GLUCOSE 116* 135*  BUN 49* 44*  CREATININE 1.67* 1.67*   No results found for this basename: TROPONINI, CK, MB,  in the last 72 hours No results found for this basename: INR,  in the last 72 hours  Imaging: Imaging results have been reviewed    Assessment/Plan:  68 yo with a history of CAD and ischemic cardiomyopathy admitted 10/03/13 with acute on chronic systolic CHF despite aggressive OP diuretic management. It appears dry wgt is around 170.     Principal Problem:   Acute on chronic systolic CHF (congestive heart failure) Active Problems:   ICM- EF 20-25% echo May 2013- pt declined ICD   Diabetes mellitus with diabetic nephropathy   CAD- CABG '94, patent grafts 2013   Stage 3 chronic renal impairment   DYSLIPIDEMIA    PLAN:  Will discuss duration of Lasix 100 mg IV BID with MD, probably continue this one more day.   Corine ShelterLuke Kilroy  PA-C Beeper [phone] 10/04/2013, 11:06 AM   I have seen and examined the patient along with Corine ShelterLuke Kilroy PA-C.  I have reviewed the chart, notes and new data.  I agree with PA's note.  Prodigious diuresis, complete resolution of dyspnea, but edema of ankles persists. Switch to PO diuretics, possible discharge in AM. "Dry weight" is 171 lb on Dr. Kathi DerMoore's office scale, today around 173 on bed scale.  Thurmon FairMihai Nieshia Larmon, MD, [facility] [phone] 10/04/2013, 12:31 PM

## 2013-10-04 NOTE — Discharge Instructions (Signed)
Heart Failure  Heart failure is a condition in which the heart has trouble pumping blood. This means your heart does not pump blood efficiently for your body to work well. In some cases of heart failure, fluid may back up into your lungs or you may have swelling (edema) in your lower legs. Heart failure is usually a long-term (chronic) condition. It is important for you to take good care of yourself and follow your caregiver's treatment plan.  CAUSES   Some health conditions can cause heart failure. Those health conditions include:  · High blood pressure (hypertension) causes the heart muscle to work harder than normal. When pressure in the blood vessels is high, the heart needs to pump (contract) with more force in order to circulate blood throughout the body. High blood pressure eventually causes the heart to become stiff and weak.  · Coronary artery disease (CAD) is the buildup of cholesterol and fat (plaque) in the arteries of the heart. The blockage in the arteries deprives the heart muscle of oxygen and blood. This can cause chest pain and may lead to a heart attack. High blood pressure can also contribute to CAD.  · Heart attack (myocardial infarction) occurs when 1 or more arteries in the heart become blocked. The loss of oxygen damages the muscle tissue of the heart. When this happens, part of the heart muscle dies. The injured tissue does not contract as well and weakens the heart's ability to pump blood.  · Abnormal heart valves can cause heart failure when the heart valves do not open and close properly. This makes the heart muscle pump harder to keep the blood flowing.  · Heart muscle disease (cardiomyopathy or myocarditis) is damage to the heart muscle from a variety of causes. These can include drug or alcohol abuse, infections, or unknown reasons. These can increase the risk of heart failure.  · Lung disease makes the heart work harder because the lungs do not work properly. This can cause a strain  on the heart, leading it to fail.  · Diabetes increases the risk of heart failure. High blood sugar contributes to high fat (lipid) levels in the blood. Diabetes can also cause slow damage to tiny blood vessels that carry important nutrients to the heart muscle. When the heart does not get enough oxygen and food, it can cause the heart to become weak and stiff. This leads to a heart that does not contract efficiently.  · Other conditions can contribute to heart failure. These include abnormal heart rhythms, thyroid problems, and low blood counts (anemia).  Certain unhealthy behaviors can increase the risk of heart failure. Those unhealthy behaviors include:  · Being overweight.  · Smoking or chewing tobacco.  · Eating foods high in fat and cholesterol.  · Abusing illicit drugs or alcohol.  · Lacking physical activity.  SYMPTOMS   Heart failure symptoms may vary and can be hard to detect. Symptoms may include:  · Shortness of breath with activity, such as climbing stairs.  · Persistent cough.  · Swelling of the feet, ankles, legs, or abdomen.  · Unexplained weight gain.  · Difficulty breathing when lying flat (orthopnea).  · Waking from sleep because of the need to sit up and get more air.  · Rapid heartbeat.  · Fatigue and loss of energy.  · Feeling lightheaded, dizzy, or close to fainting.  · Loss of appetite.  · Nausea.  · Increased urination during the night (nocturia).  DIAGNOSIS   A diagnosis   of heart failure is based on your history, symptoms, physical examination, and diagnostic tests.  Diagnostic tests for heart failure may include:  · Echocardiography.  · Electrocardiography.  · Chest X-ray.  · Blood tests.  · Exercise stress test.  · Cardiac angiography.  · Radionuclide scans.  TREATMENT   Treatment is aimed at managing the symptoms of heart failure. Medicines, behavioral changes, or surgical intervention may be necessary to treat heart failure.  · Medicines to help treat heart failure may  include:  ¨ Angiotensin-converting enzyme (ACE) inhibitors. This type of medicine blocks the effects of a blood protein called angiotensin-converting enzyme. ACE inhibitors relax (dilate) the blood vessels and help lower blood pressure.  ¨ Angiotensin receptor blockers. This type of medicine blocks the actions of a blood protein called angiotensin. Angiotensin receptor blockers dilate the blood vessels and help lower blood pressure.  ¨ Water pills (diuretics). Diuretics cause the kidneys to remove salt and water from the blood. The extra fluid is removed through urination. This loss of extra fluid lowers the volume of blood the heart pumps.  ¨ Beta blockers. These prevent the heart from beating too fast and improve heart muscle strength.  ¨ Digitalis. This increases the force of the heartbeat.  · Healthy behavior changes include:  ¨ Obtaining and maintaining a healthy weight.  ¨ Stopping smoking or chewing tobacco.  ¨ Eating heart healthy foods.  ¨ Limiting or avoiding alcohol.  ¨ Stopping illicit drug use.  ¨ Physical activity as directed by your caregiver.  · Surgical treatment for heart failure may include:  ¨ A procedure to open blocked arteries, repair damaged heart valves, or remove damaged heart muscle tissue.  ¨ A pacemaker to improve heart muscle function and control certain abnormal heart rhythms.  ¨ An internal cardioverter defibrillator to treat certain serious abnormal heart rhythms.  ¨ A left ventricular assist device to assist the pumping ability of the heart.  HOME CARE INSTRUCTIONS   · Take your medicine as directed by your caregiver. Medicines are important in reducing the workload of your heart, slowing the progression of heart failure, and improving your symptoms.  ¨ Do not stop taking your medicine unless directed by your caregiver.  ¨ Do not skip any dose of medicine.  ¨ Refill your prescriptions before you run out of medicine. Your medicines are needed every day.  ¨ Take over-the-counter  medicine only as directed by your caregiver or pharmacist.  · Engage in moderate physical activity if directed by your caregiver. Moderate physical activity can benefit some people. The elderly and people with severe heart failure should consult with a caregiver for physical activity recommendations.  · Eat heart healthy foods. Food choices should be free of trans fat and low in saturated fat, cholesterol, and salt (sodium). Healthy choices include fresh or frozen fruits and vegetables, fish, lean meats, legumes, fat-free or low-fat dairy products, and whole grain or high fiber foods. Talk to a dietitian to learn more about heart healthy foods.  · Limit sodium if directed by your caregiver. Sodium restriction may reduce symptoms of heart failure in some people. Talk to a dietitian to learn more about heart healthy seasonings.  · Use healthy cooking methods. Healthy cooking methods include roasting, grilling, broiling, baking, poaching, steaming, or stir-frying. Talk to a dietitian to learn more about healthy cooking methods.  · Limit fluids if directed by your caregiver. Fluid restriction may reduce symptoms of heart failure in some people.  · Weigh yourself every day.   Daily weights are important in the early recognition of excess fluid. You should weigh yourself every morning after you urinate and before you eat breakfast. Wear the same amount of clothing each time you weigh yourself. Record your daily weight. Provide your caregiver with your weight record.  · Monitor and record your blood pressure if directed by your caregiver.  · Check your pulse if directed by your caregiver.  · Lose weight if directed by your caregiver. Weight loss may reduce symptoms of heart failure in some people.  · Stop smoking or chewing tobacco. Nicotine makes your heart work harder by causing your blood vessels to constrict. Do not use nicotine gum or patches before talking to your caregiver.  · Schedule and attend follow-up visits as  directed by your caregiver. It is important to keep all your appointments.  · Limit alcohol intake to no more than 1 drink per day for nonpregnant women and 2 drinks per day for men. Drinking more than that is harmful to your heart. Tell your caregiver if you drink alcohol several times a week. Talk with your caregiver about whether alcohol is safe for you. If your heart has already been damaged by alcohol or you have severe heart failure, drinking alcohol should be stopped completely.  · Stop illicit drug use.  · Stay up-to-date with immunizations. It is especially important to prevent respiratory infections through current pneumococcal and influenza immunizations.  · Manage other health conditions such as hypertension, diabetes, thyroid disease, or abnormal heart rhythms as directed by your caregiver.  · Learn to manage stress.  · Plan rest periods when fatigued.  · Learn strategies to manage high temperatures. If the weather is extremely hot:  ¨ Avoid vigorous physical activity.  ¨ Use air conditioning or fans or seek a cooler location.  ¨ Avoid caffeine and alcohol.  ¨ Wear loose-fitting, lightweight, and light-colored clothing.  · Learn strategies to manage cold temperatures. If the weather is extremely cold:  ¨ Avoid vigorous physical activity.  ¨ Layer clothes.  ¨ Wear mittens or gloves, a hat, and a scarf when going outside.  ¨ Avoid alcohol.  · Obtain ongoing education and support as needed.  · Participate or seek rehabilitation as needed to maintain or improve independence and quality of life.  SEEK MEDICAL CARE IF:   · Your weight increases by 03 lb/1.4 kg in 1 day or 05 lb/2.3 kg in a week.  · You have increasing shortness of breath that is unusual for you.  · You are unable to participate in your usual physical activities.  · You tire easily.  · You cough more than normal, especially with physical activity.  · You have any or more swelling in areas such as your hands, feet, ankles, or abdomen.  · You  are unable to sleep because it is hard to breathe.  · You feel like your heart is beating fast (palpitations).  · You become dizzy or lightheaded upon standing up.  SEEK IMMEDIATE MEDICAL CARE IF:   · You have difficulty breathing.  · There is a change in mental status such as decreased alertness or difficulty with concentration.  · You have a pain or discomfort in your chest.  · You have an episode of fainting (syncope).  MAKE SURE YOU:   · Understand these instructions.  · Will watch your condition.  · Will get help right away if you are not doing well or get worse.  Document Released: 03/17/2005 Document Revised: 07/12/2012   Document Reviewed: 04/08/2012  Exit[employer]® Patient Information ©2015 [employer]. This information is not intended to replace advice given to you by your health care provider. Make sure you discuss any questions you have with your health care provider.

## 2013-10-04 NOTE — Addendum Note (Signed)
Addended by: Prescott GumLAND, Peggie Hornak M on: 10/04/2013 09:48 AM   Modules accepted: Orders

## 2013-10-04 NOTE — Care Management Note (Unsigned)
    Page 1 of 1   10/04/2013     9:49:13 AM CARE MANAGEMENT NOTE 10/04/2013  Patient:  Luke Pittman,Luke Pittman   Account Number:  [id number]  Date Initiated:  10/04/2013  Documentation initiated by:  GRAVES-BIGELOW,Keilan Nichol  Subjective/Objective Assessment:   Pt admitted for known systolic CHF and and CAD. Initiated on IV lasix.     Action/Plan:   CHF RN to speak to pt in reference to disease management. CM will continue to monitor for disposition needs.   Anticipated DC Date:  10/07/2013   Anticipated DC Plan:  HOME W HOME HEALTH SERVICES      DC Planning Services  CM consult      Choice offered to / List presented to:             Status of service:  In process, will continue to follow Medicare Important Message given?   (If response is "NO", the following Medicare IM given date fields will be blank) Date Medicare IM given:   Medicare IM given by:   Date Additional Medicare IM given:   Additional Medicare IM given by:    Discharge Disposition:    Per UR Regulation:  Reviewed for med. necessity/level of care/duration of stay  If discussed at Long Length of Stay Meetings, dates discussed:    Comments:

## 2013-10-05 DIAGNOSIS — N058 Unspecified nephritic syndrome with other morphologic changes: Secondary | ICD-10-CM

## 2013-10-05 LAB — BASIC METABOLIC PANEL
Anion gap: 15 (ref 5–15)
BUN: 43 mg/dL — ABNORMAL HIGH (ref 6–23)
CALCIUM: 10.5 mg/dL (ref 8.4–10.5)
CO2: 32 mEq/L (ref 19–32)
Chloride: 87 mEq/L — ABNORMAL LOW (ref 96–112)
Creatinine, Ser: 1.64 mg/dL — ABNORMAL HIGH (ref 0.50–1.35)
GFR, EST AFRICAN AMERICAN: 48 mL/min — AB (ref >90)
GFR, EST NON AFRICAN AMERICAN: 42 mL/min — AB (ref >90)
Glucose, Bld: 290 mg/dL — ABNORMAL HIGH (ref 70–99)
POTASSIUM: 4.5 meq/L (ref 3.7–5.3)
Sodium: 134 mEq/L — ABNORMAL LOW (ref 137–147)

## 2013-10-05 LAB — GLUCOSE, CAPILLARY: GLUCOSE-CAPILLARY: 239 mg/dL — AB (ref 70–99)

## 2013-10-05 LAB — BRAIN NATRIURETIC PEPTIDE: BNP: 1498.7 pg/mL — ABNORMAL HIGH (ref 0.0–100.0)

## 2013-10-05 MED ORDER — BUMETANIDE 2 MG PO TABS: 2.0000 mg | ORAL_TABLET | Freq: Two times a day (BID) | ORAL | Status: DC

## 2013-10-05 MED ORDER — BUMETANIDE 2 MG PO TABS
2.0000 mg | ORAL_TABLET | Freq: Two times a day (BID) | ORAL | Status: DC
  Administered 2013-10-05: 2 mg via ORAL
  Filled 2013-10-05 (×3): qty 1

## 2013-10-05 NOTE — Discharge Summary (Signed)
Patient ID: Luke Pittman,  MRN: [id number], DOB/AGE: 68/26/1947 68 y.o.  Admit date: 10/03/2013 Discharge date: 10/05/2013  Primary Care Provider: Dr Morrie Sheldon Primary Cardiologist: Dr Percival Spanish  Discharge Diagnoses Principal Problem:   Acute on chronic systolic CHF (congestive heart failure) Active Problems:   ICM- EF 20-25% echo May 2013- pt declined ICD   Diabetes mellitus with diabetic nephropathy   CAD- CABG '94, patent grafts 2013   Stage 3 chronic renal impairment   DYSLIPIDEMIA    Hospital Course:  68 yo with a history of CAD and ICM admitted 10/03/13 with acute on chronic systolic CHF despite aggressive OP diuretic management (Lasix 160 mg BID). It appears his dry wgt is around 170. His admission wgt was 173- discharge wgt 163. He diuresed > 7L  with IV Lasix. We changed him to Bumex 2 mg BID. He will follow up with Dr Percival Spanish in [place].    Discharge Vitals:  Blood pressure 105/58, pulse 91, temperature 97.2 F (36.2 C), temperature source Oral, resp. rate 18, weight 163 lb 6.4 oz (74.118 kg), SpO2 100.00%.    Labs: Results for orders placed during the hospital encounter of 10/03/13 (from the past 48 hour(s))  COMPREHENSIVE METABOLIC PANEL     Status: Abnormal   Collection Time    10/03/13  5:58 PM      Result Value Ref Range   Sodium 136 (*) 137 - 147 mEq/L   Potassium 4.5  3.7 - 5.3 mEq/L   Chloride 91 (*) 96 - 112 mEq/L   CO2 29  19 - 32 mEq/L   Glucose, Bld 116 (*) 70 - 99 mg/dL   BUN 49 (*) 6 - 23 mg/dL   Creatinine, Ser 1.67 (*) 0.50 - 1.35 mg/dL   Calcium 10.1  8.4 - 10.5 mg/dL   Total Protein 7.0  6.0 - 8.3 g/dL   Albumin 4.0  3.5 - 5.2 g/dL   AST 29  0 - 37 U/L   Comment: HEMOLYSIS AT THIS LEVEL MAY AFFECT RESULT   ALT 20  0 - 53 U/L   Alkaline Phosphatase 61  39 - 117 U/L   Total Bilirubin 0.8  0.3 - 1.2 mg/dL   GFR calc non Af Amer 41 (*) >90 mL/min   GFR calc Af Amer 47 (*) >90 mL/min   Comment: (NOTE)     The eGFR has been calculated using  the CKD EPI equation.     This calculation has not been validated in all clinical situations.     eGFR's persistently <90 mL/min signify possible Chronic Kidney     Disease.   Anion gap 16 (*) 5 - 15  PRO B NATRIURETIC PEPTIDE     Status: Abnormal   Collection Time    10/03/13  5:58 PM      Result Value Ref Range   Pro B Natriuretic peptide (BNP) 6626.0 (*) 0 - 125 pg/mL  BASIC METABOLIC PANEL     Status: Abnormal   Collection Time    10/04/13  7:20 AM      Result Value Ref Range   Sodium 137  137 - 147 mEq/L   Potassium 4.1  3.7 - 5.3 mEq/L   Chloride 91 (*) 96 - 112 mEq/L   CO2 32  19 - 32 mEq/L   Glucose, Bld 135 (*) 70 - 99 mg/dL   BUN 44 (*) 6 - 23 mg/dL   Creatinine, Ser 1.67 (*) 0.50 - 1.35 mg/dL  Calcium 9.6  8.4 - 10.5 mg/dL   GFR calc non Af Amer 41 (*) >90 mL/min   GFR calc Af Amer 47 (*) >90 mL/min   Comment: (NOTE)     The eGFR has been calculated using the CKD EPI equation.     This calculation has not been validated in all clinical situations.     eGFR's persistently <90 mL/min signify possible Chronic Kidney     Disease.   Anion gap 14  5 - 15  GLUCOSE, CAPILLARY     Status: Abnormal   Collection Time    10/04/13  8:15 AM      Result Value Ref Range   Glucose-Capillary 125 (*) 70 - 99 mg/dL  GLUCOSE, CAPILLARY     Status: Abnormal   Collection Time    10/04/13 11:22 AM      Result Value Ref Range   Glucose-Capillary 170 (*) 70 - 99 mg/dL  GLUCOSE, CAPILLARY     Status: Abnormal   Collection Time    10/04/13  4:35 PM      Result Value Ref Range   Glucose-Capillary 194 (*) 70 - 99 mg/dL  GLUCOSE, CAPILLARY     Status: Abnormal   Collection Time    10/04/13  8:38 PM      Result Value Ref Range   Glucose-Capillary 226 (*) 70 - 99 mg/dL  BASIC METABOLIC PANEL     Status: Abnormal   Collection Time    10/05/13  5:05 AM      Result Value Ref Range   Sodium 134 (*) 137 - 147 mEq/L   Potassium 4.5  3.7 - 5.3 mEq/L   Chloride 87 (*) 96 - 112 mEq/L   CO2  32  19 - 32 mEq/L   Glucose, Bld 290 (*) 70 - 99 mg/dL   BUN 43 (*) 6 - 23 mg/dL   Creatinine, Ser 2.64 (*) 0.50 - 1.35 mg/dL   Calcium 98.6  8.4 - 92.2 mg/dL   GFR calc non Af Amer 42 (*) >90 mL/min   GFR calc Af Amer 48 (*) >90 mL/min   Comment: (NOTE)     The eGFR has been calculated using the CKD EPI equation.     This calculation has not been validated in all clinical situations.     eGFR's persistently <90 mL/min signify possible Chronic Kidney     Disease.   Anion gap 15  5 - 15  GLUCOSE, CAPILLARY     Status: Abnormal   Collection Time    10/05/13  7:25 AM      Result Value Ref Range   Glucose-Capillary 239 (*) 70 - 99 mg/dL    Disposition:  Follow-up Information   Follow up with Rollene Rotunda, MD On 10/26/2013. (12 noon)    Specialty:  Cardiology   Contact information:   [address] [place] [address] [phone]       Discharge Medications:    Medication List    STOP taking these medications       furosemide 80 MG tablet  Commonly known as:  LASIX     metolazone 2.5 MG tablet  Commonly known as:  ZAROXOLYN      TAKE these medications       aspirin 81 MG tablet  Take 81 mg by mouth daily.     bumetanide 2 MG tablet  Commonly known as:  BUMEX  Take 1 tablet (2 mg total) by mouth 2 (two) times daily.  carvedilol 3.125 MG tablet  Commonly known as:  COREG  Take 1 tablet (3.125 mg total) by mouth at bedtime.     ezetimibe 10 MG tablet  Commonly known as:  ZETIA  Take 1 tablet (10 mg total) by mouth daily.     fluticasone 50 MCG/ACT nasal spray  Commonly known as:  FLONASE  Place 1 spray into both nostrils daily as needed for allergies or rhinitis.     glimepiride 4 MG tablet  Commonly known as:  AMARYL  Take 4 mg by mouth daily with breakfast.     ibuprofen 200 MG tablet  Commonly known as:  ADVIL,MOTRIN  Take 400 mg by mouth at bedtime.     lisinopril 5 MG tablet  Commonly known as:  PRINIVIL,ZESTRIL  Take 2.5 mg by mouth  daily.     metFORMIN 500 MG tablet  Commonly known as:  GLUCOPHAGE  Take 500-1,500 mg by mouth 2 (two) times daily with a meal. Takes $RemoveBef'500mg'[id number]$  in morning and 1500 in evening     MUSCLE RUB 10-15 % Crea  Apply 1 application topically daily as needed for muscle pain.     pravastatin 80 MG tablet  Commonly known as:  PRAVACHOL  Take 1 tablet (80 mg total) by mouth daily.         Duration of Discharge Encounter: Greater than 30 minutes including physician time.  Angelena Form PA-C 10/05/2013 9:01 AM

## 2013-10-05 NOTE — Progress Notes (Signed)
    Subjective:  Less SOB  Objective:  Vital Signs in the last 24 hours: Temp:  [97.2 F (36.2 C)-97.4 F (36.3 C)] 97.2 F (36.2 C) (07/08 0500) Pulse Rate:  [97-100] 97 (07/08 0500) Resp:  [18-20] 18 (07/08 0500) BP: (107-118)/(52-72) 110/72 mmHg (07/08 0500) SpO2:  [100 %] 100 % (07/08 0500) Weight:  [163 lb 6.4 oz (74.118 kg)] 163 lb 6.4 oz (74.118 kg) (07/08 0500)  Intake/Output from previous day:  Intake/Output Summary (Last 24 hours) at 10/05/13 0815 Last data filed at 10/05/13 [id number]  Gross per 24 hour  Intake    240 ml  Output   4800 ml  Net  -4560 ml    Physical Exam: General appearance: alert, cooperative, cachectic and no distress Lungs: clear to auscultation bilaterally Heart: regular rate and rhythm Extremities: trace edema   Rate: 90  Rhythm: normal sinus rhythm  Lab Results:  Recent Labs  10/03/13 1428  WBC 7.3  HGB 9.6*    Recent Labs  10/04/13 0720 10/05/13 0505  NA 137 134*  K 4.1 4.5  CL 91* 87*  CO2 32 32  GLUCOSE 135* 290*  BUN 44* 43*  CREATININE 1.67* 1.64*   No results found for this basename: TROPONINI, CK, MB,  in the last 72 hours No results found for this basename: INR,  in the last 72 hours  Imaging: Imaging results have been reviewed  Cardiac Studies:  Assessment/Plan:   Principal Problem:   Acute on chronic systolic CHF (congestive heart failure) Active Problems:   ICM- EF 20-25% echo May 2013- pt declined ICD   Diabetes mellitus with diabetic nephropathy   CAD- CABG '94, patent grafts 2013   Stage 3 chronic renal impairment   DYSLIPIDEMIA    PLAN: Try 2mg  BID Bumex BID, [facility] [place]   Corine ShelterLuke Kilroy PA-C [facility] [phone] 10/05/2013, 8:15 AM   I have seen and examined the patient along with Corine ShelterLuke Kilroy PA-C.  I have reviewed the chart, notes and new data.  I agree with PA's note.  Key new complaints: feels a little "washed out", but not dizzy or presyncopal Key examination changes: 1+ ankle  edema Key new findings / data: creatinine unchanged  PLAN: Seems to respond better to bumetanide than furosemide. DC home. Early follow up. Daily weights - call if >4-5 lb gain.   Thurmon FairMihai Shawnice Tilmon, MD, [facility] [phone] 10/05/2013, 8:41 AM

## 2013-10-26 ENCOUNTER — Ambulatory Visit (INDEPENDENT_AMBULATORY_CARE_PROVIDER_SITE_OTHER): Payer: Commercial Managed Care - HMO | Admitting: Cardiology

## 2013-10-26 ENCOUNTER — Encounter: Payer: Self-pay | Admitting: Cardiology

## 2013-10-26 ENCOUNTER — Other Ambulatory Visit (INDEPENDENT_AMBULATORY_CARE_PROVIDER_SITE_OTHER): Payer: Medicare HMO

## 2013-10-26 VITALS — BP 105/63 | HR 93 | Ht 71.0 in | Wt 168.0 lb

## 2013-10-26 DIAGNOSIS — I2589 Other forms of chronic ischemic heart disease: Secondary | ICD-10-CM

## 2013-10-26 DIAGNOSIS — Z79899 Other long term (current) drug therapy: Secondary | ICD-10-CM

## 2013-10-26 DIAGNOSIS — I255 Ischemic cardiomyopathy: Secondary | ICD-10-CM

## 2013-10-26 DIAGNOSIS — I2510 Atherosclerotic heart disease of native coronary artery without angina pectoris: Secondary | ICD-10-CM

## 2013-10-26 NOTE — Patient Instructions (Signed)
The current medical regimen is effective;  continue present plan and medications.  [facility]  (B[facility])  Follow up in 1 month with Dr Antoine PocheHochrein.

## 2013-10-26 NOTE — Progress Notes (Signed)
HPI The patient presents for follow up of CAD and ischemic cardiomyopathy.   He was hospitalized earlier this month with weight gain and shortness of breath. He was diureses about 7 L. His weight went down from 173 to 163 at discharge. He was switched from Lasix to Bumex. He returns for followup.   He said that his weight just " kept going up and got away from him". He weighs himself routinely and  His weights have been staying relatively steady although last night he had to take 2.5 mg of Zaroxolyn as he crept up slowly to 171 and is back down today to 174. He says he is breathing much better. He's not describing any PND or orthopnea. He's not been having any chest pressure, neck or arm discomfort.   Allergies  Allergen Reactions  . Beta Adrenergic Blockers Other (See Comments)    Swollen hands, Per patient currently ([id number](Jul2015) tolerating Coreg.  . Fish Oil Diarrhea  . Januvia [Sitagliptin Phosphate] Other (See Comments)    Leg cramps  . Tetanus-Diphtheria Toxoids Td Swelling    Current Outpatient Prescriptions  Medication Sig Dispense Refill  . aspirin 81 MG tablet Take 81 mg by mouth daily.      . bumetanide (BUMEX) 2 MG tablet Take 1 tablet (2 mg total) by mouth 2 (two) times daily.  60 tablet  11  . carvedilol (COREG) 3.125 MG tablet Take 1 tablet (3.125 mg total) by mouth at bedtime.  90 tablet  3  . ezetimibe (ZETIA) 10 MG tablet Take 1 tablet (10 mg total) by mouth daily.  90 tablet  3  . fluticasone (FLONASE) 50 MCG/ACT nasal spray Place 1 spray into both nostrils daily as needed for allergies or rhinitis.      Marland Kitchen. glimepiride (AMARYL) 4 MG tablet Take 4 mg by mouth daily with breakfast.      . ibuprofen (ADVIL,MOTRIN) 200 MG tablet Take 400 mg by mouth at bedtime.      Marland Kitchen. lisinopril (PRINIVIL,ZESTRIL) 5 MG tablet Take 2.5 mg by mouth daily.      . Menthol-Methyl Salicylate (MUSCLE RUB) 10-15 % CREA Apply 1 application topically daily as needed for muscle pain.      . metFORMIN  (GLUCOPHAGE) 500 MG tablet Take 500-1,500 mg by mouth 2 (two) times daily with a meal. Takes 500mg  in morning and 1500 in evening      . pravastatin (PRAVACHOL) 80 MG tablet Take 1 tablet (80 mg total) by mouth daily.  90 tablet  3   No current facility-administered medications for this visit.    Past Medical History  Diagnosis Date  . CAD (coronary artery disease)     a. s/p CABG 1994;  b.  Patent grafts 07/2011  . Chronic systolic CHF (congestive heart failure)     a. EF 25% in 2005;  b. Echo 07/2011: EF 20-25%  . Anemia   . Hyperlipidemia   . Hyperkalemia     a. 07/2011 ->acei d/c'd.  . Ischemic cardiomyopathy   . Type II diabetes mellitus   . Stroke 01/05/2013    "possibility; lost hearing in left ear; not sure it wasn't caused by fluid pill"  . 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 Cardiac catheterization  1994; ~ 07/2011  . Coronary artery bypass graft  1994    "CABG X4", LIMA to the LAD, sequential SVG to first and second obtuse marginal, sequential SVG to the  right coronary artery.    ROS: As stated in the HPI and negative for all other systems.  PHYSICAL EXAM BP 105/63  Pulse 93  Ht 5\' 11"  (1.803 m)  Wt 168 lb (76.204 kg)  BMI 23.44 kg/m2 GENERAL:  No distress NECK:  No jugular venous distention at 45 degrees, waveform within normal limits, carotid upstroke brisk and symmetric, no bruits, no thyromegaly LUNGS:  Clear to auscultation bilaterally CHEST:  Unremarkable HEART:  PMI not displaced or sustained,S1 and S2 within normal limits, positive S3, no S4, no clicks, no rubs, no murmurs ABD:  Flat, positive bowel sounds normal in frequency in pitch, no bruits, no rebound, no guarding, no midline pulsatile mass, no hepatomegaly, no splenomegaly, abdomen slightly distended EXT:  2 plus pulses throughout, moderate lower extremity edema, no cyanosis no clubbing, dependent rubor   ASSESSMENT AND PLAN  Chronic combined  systolic and diastolic congestive heart failure -   I will check a basic metabolic profile today. He will continue daily weights. Further adjustments will be based on blood work and weight gain. We've reviewed salt and fluid restriction. Of note, he continues to refuse an ICD.  He wants "quality not quantity of life."  CAD (coronary artery disease) -  The patient has no new sypmtoms. No further cardiovascular testing is indicated. We will continue with aggressive risk reduction and meds as listed.

## 2013-10-27 ENCOUNTER — Telehealth: Payer: Self-pay | Admitting: Cardiology

## 2013-10-27 ENCOUNTER — Other Ambulatory Visit: Payer: Self-pay | Admitting: *Deleted

## 2013-10-27 LAB — BMP8+EGFR
BUN / CREAT RATIO: 27 — AB (ref 10–22)
BUN: 39 mg/dL — ABNORMAL HIGH (ref 8–27)
CALCIUM: 10.2 mg/dL (ref 8.6–10.2)
CO2: 27 mmol/L (ref 18–29)
Chloride: 84 mmol/L — ABNORMAL LOW (ref 97–108)
Creatinine, Ser: 1.42 mg/dL — ABNORMAL HIGH (ref 0.76–1.27)
GFR calc Af Amer: 58 mL/min/{1.73_m2} — ABNORMAL LOW (ref >59)
GFR calc non Af Amer: 50 mL/min/{1.73_m2} — ABNORMAL LOW (ref >59)
Glucose: 165 mg/dL — ABNORMAL HIGH (ref 65–99)
POTASSIUM: 4.5 mmol/L (ref 3.5–5.2)
SODIUM: 132 mmol/L — AB (ref 134–144)

## 2013-10-27 MED ORDER — BUMETANIDE 2 MG PO TABS: 2.0000 mg | ORAL_TABLET | Freq: Two times a day (BID) | ORAL | Status: DC

## 2013-10-27 NOTE — Telephone Encounter (Signed)
Patient notified labs ok. Continue current therapy. Patient voiced understanding. Requested 90 day bumex prescription to be sent to his mail order pharmacy.

## 2013-10-27 NOTE — Telephone Encounter (Signed)
New message          Pt returning triage call about lab results

## 2013-10-27 NOTE — Telephone Encounter (Signed)
patient is calling for lab results [facility]

## 2013-10-27 NOTE — Telephone Encounter (Signed)
I looked at these results this AM.  I believe that [facility]  Labs OK.  Continue current therapy.

## 2013-10-28 ENCOUNTER — Telehealth: Payer: Self-pay | Admitting: Cardiology

## 2013-10-28 ENCOUNTER — Other Ambulatory Visit: Payer: Self-pay | Admitting: *Deleted

## 2013-10-28 DIAGNOSIS — E1129 Type 2 diabetes mellitus with other diabetic kidney complication: Secondary | ICD-10-CM

## 2013-10-28 DIAGNOSIS — Z79899 Other long term (current) drug therapy: Secondary | ICD-10-CM

## 2013-10-28 NOTE — Telephone Encounter (Signed)
Follow Up ° ° ° ° °Pt is returning call from earlier. Please call. °

## 2013-10-28 NOTE — Telephone Encounter (Signed)
Pt aware of results and to repeat lab in 2 weeks.

## 2013-11-15 ENCOUNTER — Other Ambulatory Visit (INDEPENDENT_AMBULATORY_CARE_PROVIDER_SITE_OTHER): Payer: Medicare HMO

## 2013-11-15 DIAGNOSIS — Z79899 Other long term (current) drug therapy: Secondary | ICD-10-CM

## 2013-11-15 DIAGNOSIS — E1129 Type 2 diabetes mellitus with other diabetic kidney complication: Secondary | ICD-10-CM

## 2013-11-15 NOTE — Progress Notes (Signed)
Pt came in for lab only for dr. Antoine Pochehochrein

## 2013-11-16 LAB — BMP8+EGFR
BUN / CREAT RATIO: 23 — AB (ref 10–22)
BUN: 35 mg/dL — ABNORMAL HIGH (ref 8–27)
CALCIUM: 9.5 mg/dL (ref 8.6–10.2)
CO2: 28 mmol/L (ref 18–29)
CREATININE: 1.49 mg/dL — AB (ref 0.76–1.27)
Chloride: 87 mmol/L — ABNORMAL LOW (ref 97–108)
GFR calc Af Amer: 55 mL/min/{1.73_m2} — ABNORMAL LOW (ref >59)
GFR calc non Af Amer: 48 mL/min/{1.73_m2} — ABNORMAL LOW (ref >59)
Glucose: 196 mg/dL — ABNORMAL HIGH (ref 65–99)
POTASSIUM: 4.7 mmol/L (ref 3.5–5.2)
SODIUM: 134 mmol/L (ref 134–144)

## 2013-12-13 ENCOUNTER — Encounter: Payer: Self-pay | Admitting: Family Medicine

## 2013-12-13 ENCOUNTER — Ambulatory Visit (INDEPENDENT_AMBULATORY_CARE_PROVIDER_SITE_OTHER): Payer: Medicare HMO | Admitting: Family Medicine

## 2013-12-13 VITALS — BP 110/68 | HR 83 | Temp 96.3°F | Ht 71.0 in | Wt 168.0 lb

## 2013-12-13 DIAGNOSIS — E1121 Type 2 diabetes mellitus with diabetic nephropathy: Secondary | ICD-10-CM

## 2013-12-13 DIAGNOSIS — W57XXXA Bitten or stung by nonvenomous insect and other nonvenomous arthropods, initial encounter: Secondary | ICD-10-CM

## 2013-12-13 DIAGNOSIS — H9190 Unspecified hearing loss, unspecified ear: Secondary | ICD-10-CM

## 2013-12-13 DIAGNOSIS — E785 Hyperlipidemia, unspecified: Secondary | ICD-10-CM

## 2013-12-13 DIAGNOSIS — E1122 Type 2 diabetes mellitus with diabetic chronic kidney disease: Secondary | ICD-10-CM

## 2013-12-13 DIAGNOSIS — N183 Chronic kidney disease, stage 3 unspecified: Secondary | ICD-10-CM

## 2013-12-13 DIAGNOSIS — I2510 Atherosclerotic heart disease of native coronary artery without angina pectoris: Secondary | ICD-10-CM

## 2013-12-13 DIAGNOSIS — IMO0001 Reserved for inherently not codable concepts without codable children: Secondary | ICD-10-CM | POA: Insufficient documentation

## 2013-12-13 DIAGNOSIS — H6120 Impacted cerumen, unspecified ear: Secondary | ICD-10-CM

## 2013-12-13 DIAGNOSIS — N058 Unspecified nephritic syndrome with other morphologic changes: Secondary | ICD-10-CM

## 2013-12-13 DIAGNOSIS — H6121 Impacted cerumen, right ear: Secondary | ICD-10-CM

## 2013-12-13 DIAGNOSIS — E1129 Type 2 diabetes mellitus with other diabetic kidney complication: Secondary | ICD-10-CM

## 2013-12-13 DIAGNOSIS — H9191 Unspecified hearing loss, right ear: Secondary | ICD-10-CM

## 2013-12-13 DIAGNOSIS — E559 Vitamin D deficiency, unspecified: Secondary | ICD-10-CM

## 2013-12-13 LAB — POCT CBC
Granulocyte percent: 69.3 %G (ref 37–80)
HEMATOCRIT: 35.4 % — AB (ref 43.5–53.7)
HEMOGLOBIN: 11.1 g/dL — AB (ref 14.1–18.1)
Lymph, poc: 1.5 (ref 0.6–3.4)
MCH, POC: 24.6 pg — AB (ref 27–31.2)
MCHC: 31.4 g/dL — AB (ref 31.8–35.4)
MCV: 78.5 fL — AB (ref 80–97)
MPV: 7.2 fL (ref 0–99.8)
POC Granulocyte: 4.8 (ref 2–6.9)
POC LYMPH %: 21.6 % (ref 10–50)
Platelet Count, POC: 169 10*3/uL (ref 142–424)
RBC: 4.5 M/uL — AB (ref 4.69–6.13)
RDW, POC: 18.1 %
WBC: 6.9 10*3/uL (ref 4.6–10.2)

## 2013-12-13 LAB — POCT GLYCOSYLATED HEMOGLOBIN (HGB A1C): HEMOGLOBIN A1C: 8.6

## 2013-12-13 MED ORDER — METFORMIN HCL 500 MG PO TABS: 500.0000 mg | ORAL_TABLET | Freq: Two times a day (BID) | ORAL | Status: DC

## 2013-12-13 MED ORDER — LISINOPRIL 5 MG PO TABS: 5.0000 mg | ORAL_TABLET | Freq: Every day | ORAL | Status: DC

## 2013-12-13 MED ORDER — CARVEDILOL 3.125 MG PO TABS: 3.1250 mg | ORAL_TABLET | Freq: Every day | ORAL | Status: AC

## 2013-12-13 MED ORDER — PRAVASTATIN SODIUM 80 MG PO TABS: 80.0000 mg | ORAL_TABLET | Freq: Every day | ORAL | Status: AC

## 2013-12-13 MED ORDER — GLIMEPIRIDE 4 MG PO TABS: 4.0000 mg | ORAL_TABLET | Freq: Every day | ORAL | Status: DC

## 2013-12-13 NOTE — Patient Instructions (Addendum)
Medicare Annual Wellness Visit  [place] and the medical providers at [facility] strive to bring you the best medical care.  In doing so we not only want to address your current medical conditions and concerns but also to detect new conditions early and prevent illness, disease and health-related problems.    Medicare offers a yearly Wellness Visit which allows our clinical staff to assess your need for preventative services including immunizations, lifestyle education, counseling to decrease risk of preventable diseases and screening for fall risk and other medical concerns.    This visit is provided free of charge (no copay) for all Medicare recipients. The clinical pharmacists at [facility] have begun to conduct these Wellness Visits which will also include a thorough review of all your medications.    As you primary medical provider recommend that you make an appointment for your Annual Wellness Visit if you have not done so already this year.  You may set up this appointment before you leave today or you may call back ([phone]) and schedule an appointment.  Please make sure when you call that you mention that you are scheduling your Annual Wellness Visit with the clinical pharmacist so that the appointment may be made for the proper length of time.      Continue current medications. Continue good therapeutic lifestyle changes which include good diet and exercise. Fall precautions discussed with patient. If an FOBT was given today- please return it to our front desk. If you are over 63 years old - you may need Prevnar 13 or the adult Pneumonia vaccine.  Flu Shots will be available at our office starting mid- September. Please call and schedule a FLU CLINIC APPOINTMENT.   Use cortisone 10 cream over-the-counter apply sparingly twice daily for 7-10 days to affected insect bites Debrox ear drops, over-the-counter, use 2-3  drops nightly to the affected ear for 3 nights wait 1 week and repeat,  return to the office in about 3 weeks and let the nurse irrigate the ear wax out of the right ear canal Return the FOBT We will arrange for the patient to see a podiatrist to follow his medical issues and calluses more closely.

## 2013-12-13 NOTE — Progress Notes (Signed)
Subjective:    Patient ID: Luke Pittman, male    DOB: 1945/09/18, 68 y.o.   MRN: [id number]  HPI Pt here for follow up and management of chronic medical problems.         Patient Active Problem List   Diagnosis Date Noted  . Microcytic anemia 11/20/2011  . Acute on chronic systolic CHF (congestive heart failure) 11/18/2011  . Stage 3 chronic renal impairment 11/18/2011  . ICM- EF 20-25% echo May 2013- pt declined ICD 08/18/2011  . Hyperkalemia   . Diabetes mellitus with diabetic nephropathy 07/02/2009  . DYSLIPIDEMIA 07/02/2009  . CAD- CABG '94, patent grafts 2013 07/02/2009   Outpatient Encounter Prescriptions as of 12/13/2013  Medication Sig  . aspirin 81 MG tablet Take 81 mg by mouth daily.  . bumetanide (BUMEX) 2 MG tablet Take 1 tablet (2 mg total) by mouth 2 (two) times daily.  . carvedilol (COREG) 3.125 MG tablet Take 1 tablet (3.125 mg total) by mouth at bedtime.  Marland Kitchen ezetimibe (ZETIA) 10 MG tablet Take 1 tablet (10 mg total) by mouth daily.  . fluticasone (FLONASE) 50 MCG/ACT nasal spray Place 1 spray into both nostrils daily as needed for allergies or rhinitis.  Marland Kitchen glimepiride (AMARYL) 4 MG tablet Take 4 mg by mouth daily with breakfast.  . ibuprofen (ADVIL,MOTRIN) 200 MG tablet Take 400 mg by mouth at bedtime.  Marland Kitchen lisinopril (PRINIVIL,ZESTRIL) 5 MG tablet Take 2.5 mg by mouth daily.  . Menthol-Methyl Salicylate (MUSCLE RUB) 10-15 % CREA Apply 1 application topically daily as needed for muscle pain.  . metFORMIN (GLUCOPHAGE) 500 MG tablet Take 500-1,500 mg by mouth 2 (two) times daily with a meal. Takes $RemoveBef'500mg'zxEwirNCmv$  in morning and 1500 in evening  . pravastatin (PRAVACHOL) 80 MG tablet Take 1 tablet (80 mg total) by mouth daily.    Review of Systems  Constitutional: Negative.   HENT: Negative.   Eyes: Negative.   Respiratory: Negative.   Cardiovascular: Negative.   Gastrointestinal: Negative.   Endocrine: Negative.   Genitourinary: Negative.   Musculoskeletal:  Negative.   Skin: Negative.   Allergic/Immunologic: Negative.   Neurological: Negative.   Hematological: Negative.   Psychiatric/Behavioral: Negative.        Objective:   Physical Exam  Nursing note and vitals reviewed. Constitutional: He is oriented to person, place, and time. He appears well-developed. No distress.  The patient is pleasant cooperative and alert. He is somewhat thin in appearance, but unchanged from past visit  HENT:  Head: Normocephalic and atraumatic.  Left Ear: External ear normal.  Nose: Nose normal.  Mouth/Throat: Oropharynx is clear and moist. No oropharyngeal exudate.  Right ear cerumen  Eyes: Conjunctivae and EOM are normal. Pupils are equal, round, and reactive to light. Right eye exhibits no discharge. Left eye exhibits no discharge. No scleral icterus.  Neck: Normal range of motion. Neck supple. No thyromegaly present.  No carotid bruits were audible  Cardiovascular: Normal rate, normal heart sounds and intact distal pulses.  Exam reveals no gallop and no friction rub.   No murmur heard. Irregular irregular at 84 per minute  Pulmonary/Chest: Effort normal and breath sounds normal. No respiratory distress. He has no wheezes. He has no rales. He exhibits no tenderness.  Lungs were clear anteriorly and posteriorly  Abdominal: Soft. Bowel sounds are normal. He exhibits no mass. There is no tenderness. There is no rebound and no guarding.  Musculoskeletal: Normal range of motion. He exhibits no edema and no tenderness.  Lymphadenopathy:  He has no cervical adenopathy.  Neurological: He is alert and oriented to person, place, and time. He has normal reflexes. No cranial nerve deficit.  Skin: Skin is warm and dry. No rash noted. No erythema. No pallor.  The patient has fungal nail involvement on the toes of both feet. He also has calluses on the plantar surface of the great toes on both feet. He has decreased sensation to the plantar surface of the left foot.   Psychiatric: He has a normal mood and affect. His behavior is normal. Judgment and thought content normal.   BP 110/68  Pulse 83  Temp(Src) 96.3 F (35.7 C) (Oral)  Ht $R'5\' 11"'tM$  (1.803 m)  Wt 168 lb (76.204 kg)  BMI 23.44 kg/m2        Assessment & Plan:  1. DYSLIPIDEMIA - POCT CBC - Hepatic function panel - NMR, lipoprofile  2. Stage 3 chronic renal impairment - POCT CBC - BMP8+EGFR  3. CAD- CABG '94, patent grafts 2013 - POCT CBC - BMP8+EGFR - Hepatic function panel  4. Type 2 diabetes mellitus with diabetic nephropathy - POCT CBC - POCT glycosylated hemoglobin (Hb A1C)  5. Vitamin D deficiency - Vit D  25 hydroxy (rtn osteoporosis monitoring)  6. Insect bites  7. Right ear impacted cerumen  8. Hearing impairment, right   Meds ordered this encounter  Medications  . pravastatin (PRAVACHOL) 80 MG tablet    Sig: Take 1 tablet (80 mg total) by mouth daily.    Dispense:  90 tablet    Refill:  3  . metFORMIN (GLUCOPHAGE) 500 MG tablet    Sig: Take 1-3 tablets (500-1,500 mg total) by mouth 2 (two) times daily with a meal. Takes $RemoveBef'500mg'QJsrYOlbqA$  in morning and 1500 in evening    Dispense:  180 tablet    Refill:  3  . lisinopril (PRINIVIL,ZESTRIL) 5 MG tablet    Sig: Take 1 tablet (5 mg total) by mouth daily. As directed    Dispense:  90 tablet    Refill:  3  . glimepiride (AMARYL) 4 MG tablet    Sig: Take 1 tablet (4 mg total) by mouth daily with breakfast.    Dispense:  90 tablet    Refill:  3  . carvedilol (COREG) 3.125 MG tablet    Sig: Take 1 tablet (3.125 mg total) by mouth at bedtime.    Dispense:  90 tablet    Refill:  3   Patient Instructions                       Medicare Annual Wellness Visit  [facility] and the medical providers at [facility] strive to bring you the best medical care.  In doing so we not only want to address your current medical conditions and concerns but also to detect new conditions early and prevent illness,  disease and health-related problems.    Medicare offers a yearly Wellness Visit which allows our clinical staff to assess your need for preventative services including immunizations, lifestyle education, counseling to decrease risk of preventable diseases and screening for fall risk and other medical concerns.    This visit is provided free of charge (no copay) for all Medicare recipients. The clinical pharmacists at [facility] have begun to conduct these Wellness Visits which will also include a thorough review of all your medications.    As you primary medical provider recommend that you make an appointment for your Annual  Wellness Visit if you have not done so already this year.  You may set up this appointment before you leave today or you may call back ([phone]) and schedule an appointment.  Please make sure when you call that you mention that you are scheduling your Annual Wellness Visit with the clinical pharmacist so that the appointment may be made for the proper length of time.      Continue current medications. Continue good therapeutic lifestyle changes which include good diet and exercise. Fall precautions discussed with patient. If an FOBT was given today- please return it to our front desk. If you are over 56 years old - you may need Prevnar 45 or the adult Pneumonia vaccine.  Flu Shots will be available at our office starting mid- September. Please call and schedule a FLU CLINIC APPOINTMENT.   Use cortisone 10 cream over-the-counter apply sparingly twice daily for 7-10 days to affected insect bites Debrox ear drops, over-the-counter, use 2-3 drops nightly to the affected ear for 3 nights wait 1 week and repeat,  return to the office in about 3 weeks and let the nurse irrigate the ear wax out of the right ear canal Return the FOBT We will arrange for the patient to see a podiatrist to follow his medical issues and calluses more closely.   Arrie Senate  MD

## 2013-12-14 ENCOUNTER — Ambulatory Visit (INDEPENDENT_AMBULATORY_CARE_PROVIDER_SITE_OTHER): Payer: Commercial Managed Care - HMO | Admitting: Cardiology

## 2013-12-14 ENCOUNTER — Encounter: Payer: Self-pay | Admitting: Cardiology

## 2013-12-14 VITALS — BP 104/58 | HR 99 | Ht 71.0 in | Wt 166.0 lb

## 2013-12-14 DIAGNOSIS — I2510 Atherosclerotic heart disease of native coronary artery without angina pectoris: Secondary | ICD-10-CM

## 2013-12-14 DIAGNOSIS — I2589 Other forms of chronic ischemic heart disease: Secondary | ICD-10-CM

## 2013-12-14 DIAGNOSIS — I509 Heart failure, unspecified: Secondary | ICD-10-CM

## 2013-12-14 DIAGNOSIS — I5023 Acute on chronic systolic (congestive) heart failure: Secondary | ICD-10-CM

## 2013-12-14 DIAGNOSIS — I255 Ischemic cardiomyopathy: Secondary | ICD-10-CM

## 2013-12-14 LAB — HEPATIC FUNCTION PANEL
ALBUMIN: 4.6 g/dL (ref 3.6–4.8)
ALT: 12 IU/L (ref 0–44)
AST: 18 IU/L (ref 0–40)
Alkaline Phosphatase: 68 IU/L (ref 39–117)
BILIRUBIN DIRECT: 0.2 mg/dL (ref 0.00–0.40)
BILIRUBIN TOTAL: 0.7 mg/dL (ref 0.0–1.2)
Total Protein: 6.9 g/dL (ref 6.0–8.5)

## 2013-12-14 LAB — NMR, LIPOPROFILE
CHOLESTEROL: 145 mg/dL (ref 100–199)
HDL CHOLESTEROL BY NMR: 44 mg/dL (ref >39)
HDL PARTICLE NUMBER: 34 umol/L (ref >=30.5)
LDL Particle Number: 1074 nmol/L — ABNORMAL HIGH (ref <1000)
LDL SIZE: 20.1 nm (ref >20.5)
LDLC SERPL CALC-MCNC: 76 mg/dL (ref 0–99)
LP-IR Score: 67 — ABNORMAL HIGH (ref <=45)
SMALL LDL PARTICLE NUMBER: 729 nmol/L — AB (ref <=527)
TRIGLYCERIDES BY NMR: 125 mg/dL (ref 0–149)

## 2013-12-14 LAB — BMP8+EGFR
BUN/Creatinine Ratio: 26 — ABNORMAL HIGH (ref 10–22)
BUN: 36 mg/dL — AB (ref 8–27)
CALCIUM: 9.7 mg/dL (ref 8.6–10.2)
CO2: 31 mmol/L — AB (ref 18–29)
CREATININE: 1.39 mg/dL — AB (ref 0.76–1.27)
Chloride: 92 mmol/L — ABNORMAL LOW (ref 97–108)
GFR calc Af Amer: 60 mL/min/{1.73_m2} (ref >59)
GFR, EST NON AFRICAN AMERICAN: 52 mL/min/{1.73_m2} — AB (ref >59)
Glucose: 95 mg/dL (ref 65–99)
Potassium: 4.5 mmol/L (ref 3.5–5.2)
Sodium: 138 mmol/L (ref 134–144)

## 2013-12-14 LAB — VITAMIN D 25 HYDROXY (VIT D DEFICIENCY, FRACTURES): Vit D, 25-Hydroxy: 29.8 ng/mL — ABNORMAL LOW (ref 30.0–100.0)

## 2013-12-14 NOTE — Progress Notes (Signed)
HPI The patient presents for follow up of CAD and ischemic cardiomyopathy.   He was hospitalized earlier this summer with weight gain and shortness of breath. He was diuresesd about 7 L. His weight went down from 173 to 163 at discharge. He was switched from Lasix to Bumex.   The patient actually had been doing quite well from a volume standpoint with stable weights. He has taken about 10 Zaroxolyn as needed. He has not had any new shortness of breath, PND or orthopnea. He's had no new palpitations, presyncope or syncope.  He did have blood work done yesterday and is creatinine was stable. Hemoglobin A1c was elevated at 8.6. He is cholesterol LDL 76 with an HDL of 44. Unfortunately early this morning he found out that his her diet suddenly.   Allergies  Allergen Reactions  . Beta Adrenergic Blockers Other (See Comments)    Swollen hands, Per patient currently (ZOX0960) tolerating Coreg.  . Fish Oil Diarrhea  . Januvia [Sitagliptin Phosphate] Other (See Comments)    Leg cramps  . Tetanus-Diphtheria Toxoids Td Swelling    Current Outpatient Prescriptions  Medication Sig Dispense Refill  . aspirin 81 MG tablet Take 81 mg by mouth daily.      . bumetanide (BUMEX) 2 MG tablet Take 1 tablet (2 mg total) by mouth 2 (two) times daily.  180 tablet  3  . carvedilol (COREG) 3.125 MG tablet Take 1 tablet (3.125 mg total) by mouth at bedtime.  90 tablet  3  . ezetimibe (ZETIA) 10 MG tablet Take 1 tablet (10 mg total) by mouth daily.  90 tablet  3  . fluticasone (FLONASE) 50 MCG/ACT nasal spray Place 1 spray into both nostrils daily as needed for allergies or rhinitis.      Marland Kitchen glimepiride (AMARYL) 4 MG tablet Take 1 tablet (4 mg total) by mouth daily with breakfast.  90 tablet  3  . ibuprofen (ADVIL,MOTRIN) 200 MG tablet Take 400 mg by mouth at bedtime.      Marland Kitchen lisinopril (PRINIVIL,ZESTRIL) 5 MG tablet Take 1 tablet (5 mg total) by mouth daily. As directed  90 tablet  3  . Menthol-Methyl Salicylate  (MUSCLE RUB) 10-15 % CREA Apply 1 application topically daily as needed for muscle pain.      . metFORMIN (GLUCOPHAGE) 500 MG tablet Take 1-3 tablets (500-1,500 mg total) by mouth 2 (two) times daily with a meal. Takes  in morning and 1500 in evening  180 tablet  3  . metolazone (ZAROXOLYN) 2.5 MG tablet Take 2.5 mg by mouth as needed.      . pravastatin (PRAVACHOL) 80 MG tablet Take 1 tablet (80 mg total) by mouth daily.  90 tablet  3   No current facility-administered medications for this visit.    Past Medical History  Diagnosis Date  . CAD (coronary artery disease)     a. s/p CABG 1994;  b.  Patent grafts 07/2011  . Chronic systolic CHF (congestive heart failure)     a. EF 25% in 2005;  b. Echo 07/2011: EF 20-25%  . Anemia   . Hyperlipidemia   . Hyperkalemia     a. 07/2011 ->acei d/c'd.  . Ischemic cardiomyopathy   . Type II diabetes mellitus   . Stroke 01/05/2013    "possibility; lost hearing in left ear; not sure it wasn't caused by fluid pill"  . 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 Cardiac  catheterization  1994; ~ 07/2011  . Coronary artery bypass graft  1994    "CABG X4", LIMA to the LAD, sequential SVG to first and second obtuse marginal, sequential SVG to the right coronary artery.    ROS: As stated in the HPI and negative for all other systems.  PHYSICAL EXAM BP 104/58  Pulse 99  Ht  (1.803 m)  Wt 166 lb (75.297 kg)  BMI 23.16 kg/m2 GENERAL:  No distress NECK:  No jugular venous distention at 45 degrees, waveform within normal limits, carotid upstroke brisk and symmetric, no bruits, no thyromegaly LUNGS:  Clear to auscultation bilaterally CHEST:  Unremarkable HEART:  PMI not displaced or sustained,S1 and S2 within normal limits, positive S3, no S4, no clicks, no rubs, no murmurs ABD:  Flat, positive bowel sounds normal in frequency in pitch, no bruits, no rebound, no guarding, no midline pulsatile mass, no  hepatomegaly, no splenomegaly, abdomen slightly distended EXT:  2 plus pulses throughout, moderate lower extremity edema, no cyanosis no clubbing, dependent rubor   ASSESSMENT AND PLAN  Chronic combined systolic and diastolic congestive heart failure -  He seems to be euvolemic in keeping good eye on his weights. Renal function is sitting in therapy is indicated.  CAD (coronary artery disease) -  The patient has no new sypmtoms. No further cardiovascular testing is indicated. We will continue with aggressive risk reduction and meds as listed.   CKD: As above.

## 2013-12-14 NOTE — Patient Instructions (Signed)
The current medical regimen is effective;  continue present plan and medications.  Folow up in 4 months with Dr Antoine Poche in [place].

## 2013-12-15 ENCOUNTER — Telehealth: Payer: Self-pay | Admitting: Family Medicine

## 2013-12-15 NOTE — Telephone Encounter (Signed)
Message copied by Azalee Course on Thu Dec 15, 2013 10:50 AM ------      Message from: Ernestina Penna      Created: Wed Dec 14, 2013  2:33 PM       The blood sugar is good at 95. The creatinine remains elevated at 1.39 and this is in fact lower than some recent readings. The potassium is good and the chloride is slightly decreased.      All liver function tests are within normal      With advanced lipid testing, the total LDL particle number remains slightly elevated above goal and the LDL C. appears to be pretty good at 76. The triglycerides are good.----Continue current treatment and as aggressive therapeutic lifestyle changes as possible      The vitamin D remains low, but because of the elevated creatinine we will continue the vitamin D as doing for now.      Please make sure the patient gets in to speak to the clinical pharmacist as soon as possible regarding better blood sugar control ------

## 2013-12-19 ENCOUNTER — Telehealth: Payer: Self-pay | Admitting: *Deleted

## 2013-12-19 NOTE — Telephone Encounter (Signed)
Message copied by Cleda Daub on Mon Dec 19, 2013  9:44 AM ------      Message from: Ernestina Penna      Created: Wed Dec 14, 2013  2:33 PM       The blood sugar is good at 95. The creatinine remains elevated at 1.39 and this is in fact lower than some recent readings. The potassium is good and the chloride is slightly decreased.      All liver function tests are within normal      With advanced lipid testing, the total LDL particle number remains slightly elevated above goal and the LDL C. appears to be pretty good at 76. The triglycerides are good.----Continue current treatment and as aggressive therapeutic lifestyle changes as possible      The vitamin D remains low, but because of the elevated creatinine we will continue the vitamin D as doing for now.      Please make sure the patient gets in to speak to the clinical pharmacist as soon as possible regarding better blood sugar control ------

## 2014-01-17 ENCOUNTER — Ambulatory Visit (INDEPENDENT_AMBULATORY_CARE_PROVIDER_SITE_OTHER): Payer: Medicare HMO

## 2014-01-17 DIAGNOSIS — Z23 Encounter for immunization: Secondary | ICD-10-CM

## 2014-02-13 ENCOUNTER — Telehealth: Payer: Self-pay | Admitting: Family Medicine

## 2014-02-15 NOTE — Telephone Encounter (Signed)
Called [facility] and they are faxing paperwork

## 2014-03-09 ENCOUNTER — Encounter (HOSPITAL_COMMUNITY): Payer: Self-pay | Admitting: Cardiology

## 2014-03-21 ENCOUNTER — Telehealth: Payer: Self-pay | Admitting: Family Medicine

## 2014-03-21 DIAGNOSIS — M79672 Pain in left foot: Principal | ICD-10-CM

## 2014-03-21 DIAGNOSIS — M79671 Pain in right foot: Secondary | ICD-10-CM

## 2014-03-29 ENCOUNTER — Telehealth: Payer: Self-pay | Admitting: Cardiology

## 2014-03-29 NOTE — Telephone Encounter (Signed)
Pt c/o medication issue: 1. Name of Medication: bumetanide 2. How are you currently taking this medication (dosage and times per day)? 2 mg twice a day 3. Are you having a reaction (difficulty breathing--STAT)?  No difficulty breathing  4. What is your medication issue? Fluid is not coming off and then he has to take more  metolazone 2.5 mg. Which he needs a refill for. Please call back to discuss

## 2014-03-29 NOTE — Telephone Encounter (Signed)
Pt. To see Dr. Antoine PocheHochrein on Monday @ )900

## 2014-04-03 ENCOUNTER — Encounter: Payer: Self-pay | Admitting: Cardiology

## 2014-04-03 ENCOUNTER — Ambulatory Visit (INDEPENDENT_AMBULATORY_CARE_PROVIDER_SITE_OTHER): Payer: Commercial Managed Care - HMO | Admitting: Cardiology

## 2014-04-03 VITALS — BP 106/52 | HR 94 | Ht 71.0 in | Wt 178.0 lb

## 2014-04-03 DIAGNOSIS — I5023 Acute on chronic systolic (congestive) heart failure: Secondary | ICD-10-CM

## 2014-04-03 MED ORDER — BUMETANIDE 2 MG PO TABS: 4.0000 mg | ORAL_TABLET | Freq: Every morning | ORAL | Status: DC

## 2014-04-03 NOTE — Patient Instructions (Signed)
Your physician recommends that you schedule a follow-up appointment in: 2 weeks in [place]  Take Bumex 4 mg in am and 2 mg in the pm  We want you to get some labs in one week  Dr. Antoine Poche wants you to start on Benfotiamine which is OTC

## 2014-04-03 NOTE — Progress Notes (Signed)
HPI The patient presents for follow up of CAD and ischemic cardiomyopathy.   Since I last saw him his biggest complaint has been leg pain. This is really bothering him. He did discomfort from his knees down. He has been to see physicians at the [facility] and have given him some hydrocodone but in etiology is not clear. He says this is severe and is finding it difficult to get any relief. His weights have been around 174. When he came out of the hospital previously he was in the 164 range but this was months ago. I would suspect at 170 is closer to his target. He is having to take extra Zaroxolyn almost daily. He denies any chest pressure, neck or arm discomfort. Of note he casually mentions an episode of syncope [place]. EMS came and he was evaluated but was not oriented to the hospital. He hasn't felt any palpitations. She's not had any other symptoms like that since.   Allergies  Allergen Reactions  . Beta Adrenergic Blockers Other (See Comments)    Swollen hands, Per patient currently (ZOX0960) tolerating Coreg.  . Fish Oil Diarrhea  . Januvia [Sitagliptin Phosphate] Other (See Comments)    Leg cramps  . Tetanus-Diphtheria Toxoids Td Swelling    Current Outpatient Prescriptions  Medication Sig Dispense Refill  . aspirin 81 MG tablet Take 81 mg by mouth daily.    . bumetanide (BUMEX) 2 MG tablet Take 1 tablet (2 mg total) by mouth 2 (two) times daily. 180 tablet 3  . carvedilol (COREG) 3.125 MG tablet Take 1 tablet (3.125 mg total) by mouth at bedtime. 90 tablet 3  . ezetimibe (ZETIA) 10 MG tablet Take 1 tablet (10 mg total) by mouth daily. 90 tablet 3  . fluticasone (FLONASE) 50 MCG/ACT nasal spray Place 1 spray into both nostrils daily as needed for allergies or rhinitis.    Marland Kitchen glimepiride (AMARYL) 4 MG tablet Take 1 tablet (4 mg total) by mouth daily with breakfast. 90 tablet 3  . ibuprofen (ADVIL,MOTRIN) 200 MG tablet Take 400 mg by mouth at bedtime.    Marland Kitchen  lisinopril (PRINIVIL,ZESTRIL) 5 MG tablet Take 1 tablet (5 mg total) by mouth daily. As directed 90 tablet 3  . Menthol-Methyl Salicylate (MUSCLE RUB) 10-15 % CREA Apply 1 application topically daily as needed for muscle pain.    . metFORMIN (GLUCOPHAGE) 500 MG tablet Take 1-3 tablets (500-1,500 mg total) by mouth 2 (two) times daily with a meal. Takes  in morning and 1500 in evening 180 tablet 3  . metolazone (ZAROXOLYN) 2.5 MG tablet Take 2.5 mg by mouth as needed.    . pravastatin (PRAVACHOL) 80 MG tablet Take 1 tablet (80 mg total) by mouth daily. 90 tablet 3   No current facility-administered medications for this visit.    Past Medical History  Diagnosis Date  . CAD (coronary artery disease)     a. s/p CABG 1994;  b.  Patent grafts 07/2011  . Chronic systolic CHF (congestive heart failure)     a. EF 25% in 2005;  b. Echo 07/2011: EF 20-25%  . Anemia   . Hyperlipidemia   . Hyperkalemia     a. 07/2011 ->acei d/c'd.  . Ischemic cardiomyopathy   . Type II diabetes mellitus   . Stroke 01/05/2013    "possibility; lost hearing in left ear; not sure it wasn't caused by fluid pill"  . 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 Cardiac catheterization  1994; ~ 07/2011  . Coronary artery bypass graft  1994    "CABG X4", LIMA to the LAD, sequential SVG to first and second obtuse marginal, sequential SVG to the right coronary artery.  . Left heart catheterization with coronary/graft angiogram N/A 08/05/2011    Procedure: LEFT HEART CATHETERIZATION WITH Isabel Caprice;  Surgeon: Laurey Morale, MD;  Location: [facility] CATH LAB;  Service: Cardiovascular;  Laterality: N/A;    ROS: As stated in the HPI and negative for all other systems.  PHYSICAL EXAM BP 106/52 mmHg  Pulse 94  Ht  (1.803 m)  Wt 178 lb (80.74 kg)  BMI 24.84 kg/m2 GENERAL:  No distress NECK:  No jugular venous distention at 45 degrees, waveform within normal limits,  carotid upstroke brisk and symmetric, no bruits, no thyromegaly LUNGS:  Clear to auscultation bilaterally CHEST:  Unremarkable HEART:  PMI not displaced or sustained,S1 and S2 within normal limits, positive S3, no S4, no clicks, no rubs, no murmurs ABD:  Flat, positive bowel sounds normal in frequency in pitch, no bruits, no rebound, no guarding, no midline pulsatile mass, no hepatomegaly, no splenomegaly, abdomen slightly distended EXT:  2 plus pulses throughout, moderate lower extremity edema, no cyanosis no clubbing.  EKG:  Sinus rhythm, rate 94, left axis deviation,  interventricular conduction delay, lateral T-wave inversions unchanged previous, premature ventricular contraction.  04/03/2014  ASSESSMENT AND PLAN  Chronic combined systolic and diastolic congestive heart failure -  His weight seems to be elevated above baseline. In going to increase his Bumex to 4 mg in the morning and 2 mg in the evening he is going to continue to keep a close eye on this. I  CAD (coronary artery disease) -  The patient has no new sypmtoms. No further cardiovascular testing is indicated. We will continue with aggressive risk reduction and meds as listed.   CKD: Creat was stable on recent labs from the [place] and I reviewed this but will be followed as above.    SYNCOPE:   He casually reports one episode of this around Thanksgiving. I told him I was very concerned about this with his history. He understands the risk of sudden death and has steadfastly refused to consider getting an ICD.   LEG PAIN: I suspect neuropathy.  I have suggested benfotiamine to see if this helps with the pain.

## 2014-04-05 ENCOUNTER — Telehealth: Payer: Self-pay

## 2014-04-05 NOTE — Telephone Encounter (Signed)
Bumex.  Happy New Year!!

## 2014-04-06 ENCOUNTER — Other Ambulatory Visit: Payer: Self-pay

## 2014-04-06 ENCOUNTER — Other Ambulatory Visit: Payer: Self-pay | Admitting: *Deleted

## 2014-04-06 MED ORDER — BUMETANIDE 2 MG PO TABS: ORAL_TABLET | ORAL | Status: DC

## 2014-04-06 MED ORDER — METOLAZONE 2.5 MG PO TABS: 2.5000 mg | ORAL_TABLET | ORAL | Status: DC | PRN

## 2014-04-06 NOTE — Telephone Encounter (Signed)
Ok thanks Happy New Year to you too.I miss you

## 2014-04-11 ENCOUNTER — Other Ambulatory Visit (INDEPENDENT_AMBULATORY_CARE_PROVIDER_SITE_OTHER): Payer: Commercial Managed Care - HMO

## 2014-04-11 ENCOUNTER — Telehealth: Payer: Self-pay | Admitting: Cardiology

## 2014-04-11 DIAGNOSIS — I5023 Acute on chronic systolic (congestive) heart failure: Secondary | ICD-10-CM

## 2014-04-11 NOTE — Telephone Encounter (Signed)
New problem   Pt stated since his medication has been changed he has been gaining a lot of weight. Pt want to be advise.

## 2014-04-11 NOTE — Telephone Encounter (Signed)
Pt called in stating that his bumetaide medication was changed on 1/5. His weight then was 174 he states now he is at 177 and most of the fluid is in his legs and feet. Please call and advise  Thanks

## 2014-04-11 NOTE — Progress Notes (Signed)
Lab ordered by Dr Antoine PocheHochrein

## 2014-04-12 ENCOUNTER — Other Ambulatory Visit: Payer: Self-pay

## 2014-04-12 LAB — BASIC METABOLIC PANEL
BUN: 62 mg/dL — ABNORMAL HIGH (ref 6–23)
CALCIUM: 9.2 mg/dL (ref 8.4–10.5)
CO2: 33 mEq/L — ABNORMAL HIGH (ref 19–32)
Chloride: 93 mEq/L — ABNORMAL LOW (ref 96–112)
Creat: 1.77 mg/dL — ABNORMAL HIGH (ref 0.50–1.35)
GLUCOSE: 175 mg/dL — AB (ref 70–99)
Potassium: 4.1 mEq/L (ref 3.5–5.3)
SODIUM: 136 meq/L (ref 135–145)

## 2014-04-12 MED ORDER — METOLAZONE 2.5 MG PO TABS: ORAL_TABLET | ORAL | Status: AC

## 2014-04-17 NOTE — Telephone Encounter (Signed)
Pt. To see Dr. Antoine [place]ocheHochrein in [place] on 04/26/2014 and can discuss his issues then

## 2014-04-26 ENCOUNTER — Ambulatory Visit (INDEPENDENT_AMBULATORY_CARE_PROVIDER_SITE_OTHER): Payer: Commercial Managed Care - HMO | Admitting: Cardiology

## 2014-04-26 ENCOUNTER — Encounter: Payer: Self-pay | Admitting: Cardiology

## 2014-04-26 VITALS — BP 98/48 | HR 52 | Ht 71.0 in | Wt 182.0 lb

## 2014-04-26 DIAGNOSIS — I255 Ischemic cardiomyopathy: Secondary | ICD-10-CM

## 2014-04-26 NOTE — Progress Notes (Signed)
HPI The patient presents for follow up of CAD and ischemic cardiomyopathy.   His  Biggest recent complaint has been leg pain. I asked him to start benfotiamine and this has helped.  His weights have continued to fluctuate. However, they have been relatively stable and he takes occasional Zaroxolyn. He thinks his swelling is okay. He thinks his breathing is at baseline. His creatinine is up slightly to 1.77 BUN of 62. He is due to get this checked again tomorrow. He's not having any chest pressure, neck or arm discomfort. He's not having any further syncope which he had around Thanksgiving. He doesn't notice the palpitations that he does have.    Allergies  Allergen Reactions  . Beta Adrenergic Blockers Other (See Comments)    Swollen hands, Per patient currently ([id number]) tolerating Coreg.  . Fish Oil Diarrhea  . Januvia [Sitagliptin Phosphate] Other (See Comments)    Leg cramps  . Tetanus-Diphtheria Toxoids Td Swelling    Current Outpatient Prescriptions  Medication Sig Dispense Refill  . aspirin 81 MG tablet Take 81 mg by mouth daily.    . BENFOTIAMINE PO Take 250 mg by mouth daily.    . bumetanide (BUMEX) 2 MG tablet Take 2 tablets( 4 mg)  in am and  Take 1 tablet (2 mg) in pm 270 tablet 3  . carvedilol (COREG) 3.125 MG tablet Take 1 tablet (3.125 mg total) by mouth at bedtime. 90 tablet 3  . ezetimibe (ZETIA) 10 MG tablet Take 1 tablet (10 mg total) by mouth daily. 90 tablet 3  . fluticasone (FLONASE) 50 MCG/ACT nasal spray Place 1 spray into both nostrils daily as needed for allergies or rhinitis.    Marland Kitchen glimepiride (AMARYL) 4 MG tablet Take 1 tablet (4 mg total) by mouth daily with breakfast. 90 tablet 3  . ibuprofen (ADVIL,MOTRIN) 200 MG tablet Take 400 mg by mouth at bedtime.    Marland Kitchen lisinopril (PRINIVIL,ZESTRIL) 5 MG tablet Take 1 tablet (5 mg total) by mouth daily. As directed 90 tablet 3  . Menthol-Methyl Salicylate (MUSCLE RUB) 10-15 % CREA Apply 1 application topically daily as  needed for muscle pain.    . metFORMIN (GLUCOPHAGE) 500 MG tablet Take 1-3 tablets (500-1,500 mg total) by mouth 2 (two) times daily with a meal. Takes  in morning and 1500 in evening 180 tablet 3  . metolazone (ZAROXOLYN) 2.5 MG tablet Take 1 tablet by mouth daily as needed 90 tablet 1  . pravastatin (PRAVACHOL) 80 MG tablet Take 1 tablet (80 mg total) by mouth daily. 90 tablet 3   No current facility-administered medications for this visit.    Past Medical History  Diagnosis Date  . CAD (coronary artery disease)     a. s/p CABG 1994;  b.  Patent grafts 07/2011  . Chronic systolic CHF (congestive heart failure)     a. EF 25% in 2005;  b. Echo 07/2011: EF 20-25%  . Anemia   . Hyperlipidemia   . Hyperkalemia     a. 07/2011 ->acei d/c'd.  . Ischemic cardiomyopathy   . Type II diabetes mellitus   . Stroke 01/05/2013    "possibility; lost hearing in left ear; not sure it wasn't caused by fluid pill"  . 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 Cardiac catheterization  1994; ~ 07/2011  . Coronary artery bypass graft  1994    "CABG X4", LIMA to the LAD, sequential SVG to first and  second obtuse marginal, sequential SVG to the right coronary artery.  . Left heart catheterization with coronary/graft angiogram N/A 08/05/2011    Procedure: LEFT HEART CATHETERIZATION WITH Isabel CapriceORONARY/GRAFT ANGIOGRAM;  Surgeon: Laurey Moralealton S McLean, MD;  [facility];  Service: Cardiovascular;  Laterality: N/A;    ROS: As stated in the HPI and negative for all other systems.  PHYSICAL EXAM BP 98/48 mmHg  Pulse 52  Ht 5\' 11"  (1.803 m)  Wt 182 lb (82.555 kg)  BMI 25.40 kg/m2 GENERAL:  No distress NECK:  No jugular venous distention at 45 degrees, waveform within normal limits, carotid upstroke brisk and symmetric, no bruits, no thyromegaly LUNGS:  Clear to auscultation bilaterally CHEST:  Unremarkable HEART:  PMI not displaced or sustained,S1 and S2 within  normal limits, positive S3, no S4, no clicks, no rubs, no murmurs ABD:  Flat, positive bowel sounds normal in frequency in pitch, no bruits, no rebound, no guarding, no midline pulsatile mass, no hepatomegaly, no splenomegaly, abdomen slightly distended EXT:  2 plus pulses throughout, moderate lower extremity edema, no cyanosis no clubbing.  EKG:  Sinus rhythm, rate 99, left axis deviation,  interventricular conduction delay, lateral T-wave inversions unchanged previous, premature ventricular contraction.  04/26/2014  ASSESSMENT AND PLAN  Chronic combined systolic and diastolic congestive heart failure -  He reports that his weights have been relatively stable. They go up he will take a Zaroxolyn. We're getting his basic metabolic profile checked again tomorrow.   CAD (coronary artery disease) -  The patient has no new sypmtoms. No further cardiovascular testing is indicated. We will continue with aggressive risk reduction and meds as listed.   CKD: This is followed as above.  SYNCOPE:   He casually reports one episode of this around Thanksgiving. I told him I was very concerned about this with his history. He understands the risk of sudden death and has steadfastly refused to consider getting an ICD.   LEG PAIN: I suspect neuropathy.  I suggested benfotiamine.  He thinks that this is helping.

## 2014-04-26 NOTE — Patient Instructions (Signed)
The current medical regimen is effective;  continue present plan and medications.  Follow up in 1 month with Dr Antoine PocheHochrein.  Thank you for choosing [facility]!!

## 2014-04-27 ENCOUNTER — Ambulatory Visit (INDEPENDENT_AMBULATORY_CARE_PROVIDER_SITE_OTHER): Payer: Commercial Managed Care - HMO | Admitting: Family Medicine

## 2014-04-27 ENCOUNTER — Encounter: Payer: Self-pay | Admitting: Family Medicine

## 2014-04-27 ENCOUNTER — Encounter: Payer: Self-pay | Admitting: *Deleted

## 2014-04-27 ENCOUNTER — Ambulatory Visit (INDEPENDENT_AMBULATORY_CARE_PROVIDER_SITE_OTHER): Payer: Commercial Managed Care - HMO

## 2014-04-27 VITALS — BP 92/54 | HR 95 | Temp 96.7°F | Ht 71.0 in | Wt 182.0 lb

## 2014-04-27 DIAGNOSIS — G5793 Unspecified mononeuropathy of bilateral lower limbs: Secondary | ICD-10-CM

## 2014-04-27 DIAGNOSIS — G5791 Unspecified mononeuropathy of right lower limb: Secondary | ICD-10-CM

## 2014-04-27 DIAGNOSIS — H6011 Cellulitis of right external ear: Secondary | ICD-10-CM

## 2014-04-27 DIAGNOSIS — E1122 Type 2 diabetes mellitus with diabetic chronic kidney disease: Secondary | ICD-10-CM

## 2014-04-27 DIAGNOSIS — E785 Hyperlipidemia, unspecified: Secondary | ICD-10-CM

## 2014-04-27 DIAGNOSIS — R300 Dysuria: Secondary | ICD-10-CM

## 2014-04-27 DIAGNOSIS — N183 Chronic kidney disease, stage 3 (moderate): Secondary | ICD-10-CM

## 2014-04-27 DIAGNOSIS — E559 Vitamin D deficiency, unspecified: Secondary | ICD-10-CM

## 2014-04-27 DIAGNOSIS — G5792 Unspecified mononeuropathy of left lower limb: Secondary | ICD-10-CM

## 2014-04-27 DIAGNOSIS — E1121 Type 2 diabetes mellitus with diabetic nephropathy: Secondary | ICD-10-CM

## 2014-04-27 DIAGNOSIS — N400 Enlarged prostate without lower urinary tract symptoms: Secondary | ICD-10-CM

## 2014-04-27 LAB — POCT URINALYSIS DIPSTICK
BILIRUBIN UA: NEGATIVE
Glucose, UA: NEGATIVE
KETONES UA: NEGATIVE
Nitrite, UA: NEGATIVE
Protein, UA: NEGATIVE
RBC UA: NEGATIVE
Urobilinogen, UA: NEGATIVE
pH, UA: 7

## 2014-04-27 LAB — POCT UA - MICROSCOPIC ONLY
Bacteria, U Microscopic: NEGATIVE
CASTS, UR, LPF, POC: NEGATIVE
CRYSTALS, UR, HPF, POC: NEGATIVE
Mucus, UA: NEGATIVE
RBC, URINE, MICROSCOPIC: NEGATIVE
YEAST UA: NEGATIVE

## 2014-04-27 LAB — POCT UA - MICROALBUMIN: Microalbumin Ur, POC: NEGATIVE mg/L

## 2014-04-27 LAB — POCT GLYCOSYLATED HEMOGLOBIN (HGB A1C): Hemoglobin A1C: 8.4

## 2014-04-27 MED ORDER — CEPHALEXIN 500 MG PO CAPS: 500.0000 mg | ORAL_CAPSULE | Freq: Three times a day (TID) | ORAL | Status: DC

## 2014-04-27 MED ORDER — NEOMYCIN-POLYMYXIN-HC 1 % OT SOLN: 3.0000 [drp] | Freq: Four times a day (QID) | OTIC | Status: DC

## 2014-04-27 MED ORDER — METFORMIN HCL 500 MG PO TABS: 500.0000 mg | ORAL_TABLET | Freq: Two times a day (BID) | ORAL | Status: AC

## 2014-04-27 NOTE — Progress Notes (Signed)
We will await the clinical pharmacists recommendations on this.

## 2014-04-27 NOTE — Progress Notes (Addendum)
Subjective:    Patient ID: Luke Pittman, male    DOB: [birth year], 69 y.o.   MRN: [id number]  HPI Pt here for follow up and management of chronic medical problems which include diabetes, chronic renal insuffiencey, and hyperlipidemia. He is taking medications regularly. The patient sees the cardiologist regularly because of his ongoing heart issues. He does complain today of some bleeding from his right ear and trouble hearing. He also complains of issues with his legs and feet. The patient comes to the visit today with his wife. He is complaining of leg pain so severe at nighttime that he cannot rest. He indicates he is allergic to gabapentin. He would also not be a candidate for try cyclic antidepressant because of the side effects of cardiac arrhythmia. We will look into the possibility of trying Cymbalta but will not start this until we make sure there is no drug interactions. The patient has had trouble hearing from his right ear for several days and this has happened in the past. He has noticed the bleeding in the past 24 hours. The patient also complains of increasing neuropathy with his legs and feet as indicated above. He indicates his blood sugars at home have been under better control recently and had been averaging about 110. He has had several low blood sugars in the middle of the night. He has had some low back pain also.         Patient Active Problem List   Diagnosis Date Noted  . Hearing impairment 12/13/2013  . Microcytic anemia 11/20/2011  . Acute on chronic systolic CHF (congestive heart failure) 11/18/2011  . Stage 3 chronic renal impairment 11/18/2011  . ICM- EF 20-25% echo May 2013- pt declined ICD 08/18/2011  . Hyperkalemia   . Diabetes mellitus with diabetic nephropathy 07/02/2009  . DYSLIPIDEMIA 07/02/2009  . 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 aspirin 81 MG tablet Take 81 mg by  mouth daily.  . BENFOTIAMINE PO Take 250 mg by mouth daily.  . bumetanide (BUMEX) 2 MG tablet Take 2 tablets( 4 mg)  in am and  Take 1 tablet (2 mg) in pm  . carvedilol (COREG) 3.125 MG tablet Take 1 tablet (3.125 mg total) by mouth at bedtime.  Marland Kitchen ezetimibe (ZETIA) 10 MG tablet Take 1 tablet (10 mg total) by mouth daily.  . fluticasone (FLONASE) 50 MCG/ACT nasal spray Place 1 spray into both nostrils daily as needed for allergies or rhinitis.  Marland Kitchen glimepiride (AMARYL) 4 MG tablet Take 1 tablet (4 mg total) by mouth daily with breakfast.  . ibuprofen (ADVIL,MOTRIN) 200 MG tablet Take 400 mg by mouth at bedtime.  Marland Kitchen lisinopril (PRINIVIL,ZESTRIL) 5 MG tablet Take 1 tablet (5 mg total) by mouth daily. As directed  . Menthol-Methyl Salicylate (MUSCLE RUB) 10-15 % CREA Apply 1 application topically daily as needed for muscle pain.  . metFORMIN (GLUCOPHAGE) 500 MG tablet Take 1-3 tablets (500-1,500 mg total) by mouth 2 (two) times daily with a meal. Takes $RemoveBef'500mg'[id number]$  in morning and 1500 in evening  . metolazone (ZAROXOLYN) 2.5 MG tablet Take 1 tablet by mouth daily as needed  . pravastatin (PRAVACHOL) 80 MG tablet Take 1 tablet (80 mg total) by mouth daily.     Review of Systems  Constitutional: Negative.   HENT: Positive for ear discharge (right ear bleeding times 2 days).   Eyes: Negative.   Respiratory: Negative.  Cardiovascular: Negative.   Gastrointestinal: Negative.   Endocrine: Negative.   Genitourinary: Negative.   Musculoskeletal: Negative.   Skin: Negative.   Allergic/Immunologic: Negative.   Neurological: Negative.        Neuropathy of feet and legs  Hematological: Negative.   Psychiatric/Behavioral: Negative.        Objective:   Physical Exam  Constitutional: He is oriented to person, place, and time. No distress.  The patient is thin, appears somewhat frail, and his having problems with hearing in the right ear.  HENT:  Head: Normocephalic and atraumatic.  Left Ear: External  ear normal.  Nose: Nose normal.  Mouth/Throat: Oropharynx is clear and moist. No oropharyngeal exudate.  The right ear canal is inflamed there is no active bleeding and there is a yellow discoloration and the superior anterior portion of the ear canal. The eardrum does not appear normal.  Eyes: Conjunctivae and EOM are normal. Pupils are equal, round, and reactive to light. Right eye exhibits no discharge. Left eye exhibits no discharge. No scleral icterus.  Neck: Normal range of motion. Neck supple. No thyromegaly present.  No anterior cervical nodes  Cardiovascular: Normal rate and normal heart sounds.  Exam reveals no gallop and no friction rub.   No murmur heard. The heart is irregular at 72-84/m  Pulmonary/Chest: Effort normal and breath sounds normal. No respiratory distress. He has no wheezes. He has no rales. He exhibits no tenderness.  Abdominal: Soft. Bowel sounds are normal. He exhibits no mass. There is no tenderness. There is no rebound and no guarding.  Genitourinary: Rectum normal and penis normal.  The prostate is minimally enlarged and there are no inguinal nodes or hernias. The rectal exam had no masses.  Musculoskeletal: Normal range of motion. He exhibits edema. He exhibits no tenderness.  There is slight pedal edema and prominent varicosities bilaterally. Both feet have calluses and nail fungus the callus is much worse on the left plantar foot and he has plans to see the podiatrist again today for follow-up of this  Lymphadenopathy:    He has no cervical adenopathy.  Neurological: He is alert and oriented to person, place, and time. He has normal reflexes. No cranial nerve deficit.  Skin: Skin is warm and dry. No rash noted. There is erythema. There is pallor.  There is erythema that is slightly around the callus on the plantar surface of the left foot.  Psychiatric: He has a normal mood and affect. His behavior is normal. Judgment and thought content normal.  Nursing note  and vitals reviewed.  BP 92/54 mmHg  Pulse 95  Temp(Src) 96.7 F (35.9 C) (Oral)  Ht 5\' 11"  (1.803 m)  Wt 182 lb (82.555 kg)  BMI 25.40 kg/m2  [facility] reading (PRIMARY) by  Dr. Brand MalesMoore-LS spine-scoliosis and degenerative changes especially prominent at L5-S1                                       Assessment & Plan:  1. Type 2 diabetes mellitus with diabetic nephropathy -Lab work today will determine any change in blood sugar treatment -Because of problems with hypoglycemia he will be scheduled to visit with the clinical pharmacists. -He will bring blood sugar readings in to this visit - POCT CBC - BMP8+EGFR - POCT UA - Microalbumin - POCT glycosylated hemoglobin (Hb A1C)  2. Vitamin D deficiency -This will be checked today and any treatment  changes will be determined on lab values obtained today. - POCT CBC - Vit D  25 hydroxy (rtn osteoporosis monitoring)  3. BPH (benign prostatic hyperplasia) -No change in treatment - POCT CBC - PSA, total and free - POCT UA - Microalbumin - POCT UA - Microscopic Only - POCT urinalysis dipstick  4. Stage 3 chronic renal impairment -We will monitor the renal impairment today and the patient should continue to avoid all NSAIDs - POCT CBC - BMP8+EGFR  5. Hyperlipidemia -Any change in treatment will be determined by the results of the lab work today - POCT CBC - Hepatic function panel - NMR, lipoprofile  6. Cellulitis of ear canal, right -Cephalexin 500  3 times daily and Cortisporin otic -A follow-up appointment will be given for the patient to see an ear nose and throat specialist - Ambulatory referral to ENT  7. Neuropathy involving both lower extremities -Consideration of Cymbalta will be given pending any drug interactions that would prohibit this. - DG Lumbar Spine 2-3 Views; Future - Vitamin B12  Meds ordered this encounter  Medications  . metFORMIN (GLUCOPHAGE) 500 MG tablet    Sig: Take 1-3 tablets (500-1,500 mg total)  by mouth 2 (two) times daily with a meal. Takes $RemoveBef'500mg'[id number]$  in morning and 1500 in evening    Dispense:  180 tablet    Refill:  3  . cephALEXin (KEFLEX) 500 MG capsule    Sig: Take 1 capsule (500 mg total) by mouth 3 (three) times daily.    Dispense:  30 capsule    Refill:  0  . NEOMYCIN-POLYMYXIN-HYDROCORTISONE (CORTISPORIN) 1 % SOLN otic solution    Sig: Place 3 drops into the right ear 4 (four) times daily.    Dispense:  10 mL    Refill:  0   Patient Instructions                       Medicare Annual Wellness Visit  [facility] and the medical providers at [facility] strive to bring you the best medical care.  In doing so we not only want to address your current medical conditions and concerns but also to detect new conditions early and prevent illness, disease and health-related problems.    Medicare offers a yearly Wellness Visit which allows our clinical staff to assess your need for preventative services including immunizations, lifestyle education, counseling to decrease risk of preventable diseases and screening for fall risk and other medical concerns.    This visit is provided free of charge (no copay) for all Medicare recipients. The clinical pharmacists at [facility] have begun to conduct these Wellness Visits which will also include a thorough review of all your medications.    As you primary medical provider recommend that you make an appointment for your Annual Wellness Visit if you have not done so already this year.  You may set up this appointment before you leave today or you may call back ([phone]) and schedule an appointment.  Please make sure when you call that you mention that you are scheduling your Annual Wellness Visit with the clinical pharmacist so that the appointment may be made for the proper length of time.     Continue current medications. Continue good therapeutic lifestyle changes which include good diet and  exercise. Fall precautions discussed with patient. If an FOBT was given today- please return it to our front desk. If you are over 56 years old - you  may need Prevnar 13 or the adult Pneumonia vaccine.  Flu Shots will be available at our office starting mid- September. Please call and schedule a FLU CLINIC APPOINTMENT.   Because of the neuropathy in your feet we will consider trying Cymbalta but only after week review this with the pharmacist and make sure there is no drug interactions with other medications that you're taking. Because of the low blood sugars at nighttime we will ask for you to bring home blood sugar readings in and meet with the clinical pharmacists and a couple weeks to make sure that we try to avoid any nighttime hypoglycemia. In the meantime always make sure you have a snack at bedtime. We will get x-rays of your low back to make sure that problems here are not content to be taking to the pain in your feet. Because of the ongoing issues with the right ear canal we will arrange for you to have an appointment with an ear nose and throat specialist and a couple weeks, in the meantime take antibiotic and use eardrops as directed Keep follow-up appointment with cardiology keep follow-up appointment today with the podiatrist     Arrie Senate MD

## 2014-04-27 NOTE — Patient Instructions (Addendum)
Medicare Annual Wellness Visit   and the medical providers at [facility] strive to bring you the best medical care.  In doing so we not only want to address your current medical conditions and concerns but also to detect new conditions early and prevent illness, disease and health-related problems.    Medicare offers a yearly Wellness Visit which allows our clinical staff to assess your need for preventative services including immunizations, lifestyle education, counseling to decrease risk of preventable diseases and screening for fall risk and other medical concerns.    This visit is provided free of charge (no copay) for all Medicare recipients. The clinical pharmacists at [facility] have begun to conduct these Wellness Visits which will also include a thorough review of all your medications.    As you primary medical provider recommend that you make an appointment for your Annual Wellness Visit if you have not done so already this year.  You may set up this appointment before you leave today or you may call back [phone] and schedule an appointment.  Please make sure when you call that you mention that you are scheduling your Annual Wellness Visit with the clinical pharmacist so that the appointment may be made for the proper length of time.     Continue current medications. Continue good therapeutic lifestyle changes which include good diet and exercise. Fall precautions discussed with patient. If an FOBT was given today- please return it to our front desk. If you are over 69 years old - you may need Prevnar 13 or the adult Pneumonia vaccine.  Flu Shots will be available at our office starting mid- September. Please call and schedule a FLU CLINIC APPOINTMENT.   Because of the neuropathy in your feet we will consider trying Cymbalta but only after week review this with the pharmacist and make sure there is no  drug interactions with other medications that you're taking. Because of the low blood sugars at nighttime we will ask for you to bring home blood sugar readings in and meet with the clinical pharmacists and a couple weeks to make sure that we try to avoid any nighttime hypoglycemia. In the meantime always make sure you have a snack at bedtime. We will get x-rays of your low back to make sure that problems here are not content to be taking to the pain in your feet. Because of the ongoing issues with the right ear canal we will arrange for you to have an appointment with an ear nose and throat specialist and a couple weeks, in the meantime take antibiotic and use eardrops as directed Keep follow-up appointment with cardiology keep follow-up appointment today with the podiatrist

## 2014-04-27 NOTE — Addendum Note (Signed)
Addended by: Prescott GumLAND, Alzora Ha M on: 04/27/2014 09:44 AM   Modules accepted: Orders, SmartSet

## 2014-04-27 NOTE — Progress Notes (Signed)
I don't see anything currently noted in the patient's current medications, problem list or past medical history that would prevent the use of Cymbalta 30mg .  I  Also would recommend patient consider referral to [facility] since he declined appt with clinical pharmacist.

## 2014-04-27 NOTE — Progress Notes (Signed)
Luke Pittman,  Dr [name] would like you to look through the pts medications and medical history and see if there is any reason why this pt should not be started on Cymbalta 30.  Thank you

## 2014-04-28 LAB — NMR, LIPOPROFILE
Cholesterol: 109 mg/dL (ref 100–199)
HDL Cholesterol by NMR: 51 mg/dL (ref >39)
HDL Particle Number: 35 umol/L (ref >=30.5)
LDL Particle Number: 467 nmol/L (ref <1000)
LDL SIZE: 20.4 nm (ref >20.5)
LDL-C: 50 mg/dL (ref 0–99)
LP-IR SCORE: 30 (ref <=45)
SMALL LDL PARTICLE NUMBER: 316 nmol/L (ref <=527)
Triglycerides by NMR: 42 mg/dL (ref 0–149)

## 2014-04-28 LAB — PSA, TOTAL AND FREE
PSA, Free Pct: 25.6 %
PSA, Free: 0.23 ng/mL
PSA: 0.9 ng/mL (ref 0.0–4.0)

## 2014-04-28 LAB — CBC WITH DIFFERENTIAL/PLATELET
BASOS: 0 %
Basophils Absolute: 0 10*3/uL (ref 0.0–0.2)
EOS ABS: 0.2 10*3/uL (ref 0.0–0.4)
Eos: 3 %
HCT: 32.1 % — ABNORMAL LOW (ref 37.5–51.0)
Hemoglobin: 10 g/dL — ABNORMAL LOW (ref 12.6–17.7)
IMMATURE GRANS (ABS): 0 10*3/uL (ref 0.0–0.1)
Immature Granulocytes: 0 %
Lymphocytes Absolute: 1.5 10*3/uL (ref 0.7–3.1)
Lymphs: 20 %
MCH: 24.4 pg — ABNORMAL LOW (ref 26.6–33.0)
MCHC: 31.2 g/dL — ABNORMAL LOW (ref 31.5–35.7)
MCV: 78 fL — ABNORMAL LOW (ref 79–97)
MONOCYTES: 11 %
Monocytes Absolute: 0.8 10*3/uL (ref 0.1–0.9)
NEUTROS PCT: 66 %
Neutrophils Absolute: 4.9 10*3/uL (ref 1.4–7.0)
Platelets: 162 10*3/uL (ref 150–379)
RBC: 4.1 x10E6/uL — AB (ref 4.14–5.80)
RDW: 16.4 % — AB (ref 12.3–15.4)
WBC: 7.4 10*3/uL (ref 3.4–10.8)

## 2014-04-28 LAB — HEPATIC FUNCTION PANEL
ALT: 17 IU/L (ref 0–44)
AST: 28 IU/L (ref 0–40)
Albumin: 4.4 g/dL (ref 3.6–4.8)
Alkaline Phosphatase: 71 IU/L (ref 39–117)
Bilirubin, Direct: 0.21 mg/dL (ref 0.00–0.40)
TOTAL PROTEIN: 6.5 g/dL (ref 6.0–8.5)
Total Bilirubin: 0.5 mg/dL (ref 0.0–1.2)

## 2014-04-28 LAB — BMP8+EGFR
BUN/Creatinine Ratio: 37 — ABNORMAL HIGH (ref 10–22)
BUN: 61 mg/dL — ABNORMAL HIGH (ref 8–27)
CO2: 29 mmol/L (ref 18–29)
Calcium: 9.4 mg/dL (ref 8.6–10.2)
Chloride: 86 mmol/L — ABNORMAL LOW (ref 97–108)
Creatinine, Ser: 1.65 mg/dL — ABNORMAL HIGH (ref 0.76–1.27)
GFR calc Af Amer: 49 mL/min/{1.73_m2} — ABNORMAL LOW (ref >59)
GFR calc non Af Amer: 42 mL/min/{1.73_m2} — ABNORMAL LOW (ref >59)
GLUCOSE: 70 mg/dL (ref 65–99)
Potassium: 4.6 mmol/L (ref 3.5–5.2)
SODIUM: 131 mmol/L — AB (ref 134–144)

## 2014-04-28 LAB — VITAMIN B12: Vitamin B-12: 194 pg/mL — ABNORMAL LOW (ref 211–946)

## 2014-04-28 LAB — VITAMIN D 25 HYDROXY (VIT D DEFICIENCY, FRACTURES): VIT D 25 HYDROXY: 30 ng/mL (ref 30.0–100.0)

## 2014-04-28 MED ORDER — DULOXETINE HCL 30 MG PO CPEP: 30.0000 mg | ORAL_CAPSULE | Freq: Every day | ORAL | Status: DC

## 2014-04-28 NOTE — Progress Notes (Signed)
Refer to diabetes education Center at [facility] because of the elevated A1c and low blood sugars at night--- this is very important that the patient does this. Please talk to the patient's wife also The clinical pharmacists reviewed his medication list and does not see any problems with trying Cymbalta 30 mg at bedtime with a snack. This is done to see if he'll help his neuropathy.

## 2014-04-28 NOTE — Progress Notes (Signed)
i will discuss this with pt - notes in result notes - from labs

## 2014-05-03 ENCOUNTER — Telehealth: Payer: Self-pay | Admitting: Family Medicine

## 2014-05-03 ENCOUNTER — Other Ambulatory Visit: Payer: Commercial Managed Care - HMO

## 2014-05-03 DIAGNOSIS — R300 Dysuria: Secondary | ICD-10-CM

## 2014-05-03 NOTE — Addendum Note (Signed)
Addended by: Orma RenderHODGES, Ellery Meroney F on: 05/03/2014 11:02 AM   Modules accepted: Orders

## 2014-05-03 NOTE — Telephone Encounter (Signed)
Please discontinue the Keflex and take the doxycycline instead

## 2014-05-03 NOTE — Telephone Encounter (Signed)
Patient was put on Doxycycline by ENT and we prescribed him Keflex does he need to take both antibiotics. He came in today and left urine for a culture and patient is unsure if he needs to take both antibiotics.

## 2014-05-04 LAB — URINE CULTURE: ORGANISM ID, BACTERIA: NO GROWTH

## 2014-05-04 NOTE — Telephone Encounter (Signed)
Patients wife aware

## 2014-05-11 ENCOUNTER — Encounter: Payer: Self-pay | Admitting: Pharmacist

## 2014-05-11 ENCOUNTER — Ambulatory Visit (INDEPENDENT_AMBULATORY_CARE_PROVIDER_SITE_OTHER): Payer: Commercial Managed Care - HMO | Admitting: Pharmacist

## 2014-05-11 VITALS — BP 101/58 | HR 70 | Ht 71.0 in | Wt 179.0 lb

## 2014-05-11 DIAGNOSIS — E1121 Type 2 diabetes mellitus with diabetic nephropathy: Secondary | ICD-10-CM

## 2014-05-11 DIAGNOSIS — Z79899 Other long term (current) drug therapy: Secondary | ICD-10-CM

## 2014-05-11 MED ORDER — ACETAMINOPHEN 500 MG PO TABS: 500.0000 mg | ORAL_TABLET | Freq: Four times a day (QID) | ORAL | Status: DC | PRN

## 2014-05-11 NOTE — Patient Instructions (Signed)
Hold glimepiride for now since you are having episodes of low blood glucose.  If you begin to have blood glucose reading that are over 200.  Call me for further instruction - Cherre Robins - [phone] Continue metformin / Glucophage $RemoveBefo'500mg'WZICmwdxNei$  twice a day with food   Diabetes and Standards of Medical Care   Diabetes is complicated. You may find that your diabetes team includes a dietitian, nurse, diabetes educator, eye doctor, and more. To help everyone know what is going on and to help you get the care you deserve, the following schedule of care was developed to help keep you on track. Below are the tests, exams, vaccines, medicines, education, and plans you will need.  Blood Glucose Goals Prior to meals = 80 - 130 Within 2 hours of the start of a meal = less than 180  HbA1c test (goal is less than 7.0% - your last value was %) This test shows how well you have controlled your glucose over the past 2 to 3 months. It is used to see if your diabetes management plan needs to be adjusted.   It is performed at least 2 times a year if you are meeting treatment goals.  It is performed 4 times a year if therapy has changed or if you are not meeting treatment goals.  Blood pressure test  This test is performed at every routine medical visit. The goal is less than 140/90 mmHg for most people, but 130/80 mmHg in some cases. Ask your health care provider about your goal.  Dental exam  Follow up with the dentist regularly.  Eye exam  If you are diagnosed with type 1 diabetes as a child, get an exam upon reaching the age of 62 years or older and have had diabetes for 3 to 5 years. Yearly eye exams are recommended after that initial eye exam.  If you are diagnosed with type 1 diabetes as an adult, get an exam within 5 years of diagnosis and then yearly.  If you are diagnosed with type 2 diabetes, get an exam as soon as possible after the diagnosis and then yearly.  Foot care exam  Visual foot exams  are performed at every routine medical visit. The exams check for cuts, injuries, or other problems with the feet.  A comprehensive foot exam should be done yearly. This includes visual inspection as well as assessing foot pulses and testing for loss of sensation.  Check your feet nightly for cuts, injuries, or other problems with your feet. Tell your health care provider if anything is not healing.  Kidney function test (urine microalbumin)  This test is performed once a year.  Type 1 diabetes: The first test is performed 5 years after diagnosis.  Type 2 diabetes: The first test is performed at the time of diagnosis.  A serum creatinine and estimated glomerular filtration rate (eGFR) test is done once a year to assess the level of chronic kidney disease (CKD), if present.  Lipid profile (cholesterol, HDL, LDL, triglycerides)  Performed every 5 years for most people.  The goal for LDL is less than 100 mg/dL. If you are at high risk, the goal is less than 70 mg/dL.  The goal for HDL is 40 mg/dL to 50 mg/dL for men and 50 mg/dL to 60 mg/dL for women. An HDL cholesterol of 60 mg/dL or higher gives some protection against heart disease.  The goal for triglycerides is less than 150 mg/dL.  Influenza vaccine, pneumococcal vaccine, and hepatitis  B vaccine  The influenza vaccine is recommended yearly.  The pneumococcal vaccine is generally given once in a lifetime. However, there are some instances when another vaccination is recommended. Check with your health care provider.  The hepatitis B vaccine is also recommended for adults with diabetes.  Diabetes self-management education  Education is recommended at diagnosis and ongoing as needed.  Treatment plan  Your treatment plan is reviewed at every medical visit.  Document Released: 01/12/2009 Document Revised: 11/17/2012 Document Reviewed: 08/17/2012 [facility] Patient Information 2014 [place].  Hypoglycemia Hypoglycemia  occurs when the glucose in your blood is too low. Glucose is a type of sugar that is your body's main energy source. Hormones, such as insulin and glucagon, control the level of glucose in the blood. Insulin lowers blood glucose and glucagon increases blood glucose. Having too much insulin in your blood stream, or not eating enough food containing sugar, can result in hypoglycemia. Hypoglycemia can happen to people with or without diabetes. It can develop quickly and can be a medical emergency.  CAUSES   Missing or delaying meals.  Not eating enough carbohydrates at meals.  Taking too much diabetes medicine.  Not timing your oral diabetes medicine or insulin doses with meals, snacks, and exercise.  Nausea and vomiting.  Certain medicines.  Severe illnesses, such as hepatitis, kidney disorders, and certain eating disorders.  Increased activity or exercise without eating something extra or adjusting medicines.  Drinking too much alcohol.  A nerve disorder that affects body functions like your heart rate, blood pressure, and digestion (autonomic neuropathy).  A condition where the stomach muscles do not function properly (gastroparesis). Therefore, medicines and food may not absorb properly.  Rarely, a tumor of the pancreas can produce too much insulin. SYMPTOMS   Hunger.  Sweating (diaphoresis).  Change in body temperature.  Shakiness.  Headache.  Anxiety.  Lightheadedness.  Irritability.  Difficulty concentrating.  Dry mouth.  Tingling or numbness in the hands or feet.  Restless sleep or sleep disturbances.  Altered speech and coordination.  Change in mental status.  Seizures or prolonged convulsions.  Combativeness.  Drowsiness (lethargic).  Weakness.  Increased heart rate or palpitations.  Confusion.  Pale, gray skin color.  Blurred or double vision.  Fainting. DIAGNOSIS  A physical exam and medical history will be performed. Your caregiver  may make a diagnosis based on your symptoms. Blood tests and other lab tests may be performed to confirm a diagnosis. Once the diagnosis is made, your caregiver will see if your signs and symptoms go away once your blood glucose is raised.  TREATMENT  Usually, you can easily treat your hypoglycemia when you notice symptoms.  Check your blood glucose. If it is less than 70 mg/dl, take one of the following:   3-4 glucose tablets.    cup juice.    cup regular soda.   1 cup skim milk.   -1 tube of glucose gel.   5-6 hard candies.   Avoid high-fat drinks or food that may delay a rise in blood glucose levels.  Do not take more than the recommended amount of sugary foods, drinks, gel, or tablets. Doing so will cause your blood glucose to go too high.   Wait 10-15 minutes and recheck your blood glucose. If it is still less than 70 mg/dl or below your target range, repeat treatment.   Eat a snack if it is more than 1 hour until your next meal.  There may be a time when your  blood glucose may go so low that you are unable to treat yourself at home when you start to notice symptoms. You may need someone to help you. You may even faint or be unable to swallow. If you cannot treat yourself, someone will need to bring you to the hospital.  [place]  If you have diabetes, follow your diabetes management plan by:  Taking your medicines as directed.  Following your exercise plan.  Following your meal plan. Do not skip meals. Eat on time.  Testing your blood glucose regularly. Check your blood glucose before and after exercise. If you exercise longer or different than usual, be sure to check blood glucose more frequently.  Wearing your medical alert jewelry that says you have diabetes.  Identify the cause of your hypoglycemia. Then, develop ways to prevent the recurrence of hypoglycemia.  Do not take a hot bath or shower right after an insulin shot.  Always carry  treatment with you. Glucose tablets are the easiest to carry.  If you are going to drink alcohol, drink it only with meals.  Tell friends or family members ways to keep you safe during a seizure. This may include removing hard or sharp objects from the area or turning you on your side.  Maintain a healthy weight. SEEK MEDICAL CARE IF:   You are having problems keeping your blood glucose in your target range.  You are having frequent episodes of hypoglycemia.  You feel you might be having side effects from your medicines.  You are not sure why your blood glucose is dropping so low.  You notice a change in vision or a new problem with your vision. SEEK IMMEDIATE MEDICAL CARE IF:   Confusion develops.  A change in mental status occurs.  The inability to swallow develops.  Fainting occurs. Document Released: 03/17/2005 Document Revised: 03/22/2013 Document Reviewed: 07/14/2011 [facility] Patient Information 2015 [place], [place]. This information is not intended to replace advice given to you by your health care provider. Make sure you discuss any questions you have with your health care provider.

## 2014-05-11 NOTE — Progress Notes (Signed)
Subjective:    Luke Pittman is a 69 y.o. male who presents for an initial evaluation of Type 2 diabetes mellitus.  Current symptoms/problems include hypoglycemia  and have been unchanged. Symptoms have been present for 1 month. Patient's last A1c = 8.4% (04/27/2014) per patient this is improved from [facility] when A1c was 9.3%  The patient was initially diagnosed with Type 2 diabetes mellitus in 1991  Known diabetic complications: nephropathy and peripheral neuropathy Cardiovascular risk factors: advanced age (older than 9155 for men, 4865 for women), diabetes mellitus and dyslipidemia Current diabetic medications include glimepiride 4mg  1 tablet - patient takes as needed for BG over 120.  Patient states that he has not taken in last 1-2 weeks.  Also sometimes he takes 1/2 tablet bid.   Metformin 500mg  - take takes 2 to 4 tablets daily depending on his BG. Mostly taking 2 tabs per day.  Eye exam current (within one year): unknown Weight trend: stable Prior visit with dietician: no Current diet: in general, an "unhealthy" diet Current exercise: none  Current monitoring regimen: home blood tests - two times daily Home blood sugar records: am range from 63 to 109;  afternoon range 92-190 Any episodes of hypoglycemia? yes - mostly occuring in the night or early am.  Is He on ACE inhibitor or angiotensin II receptor blocker?  Yes  lisinopril (Zestril)   The following portions of the patient's history were reviewed and updated as appropriate: allergies, current medications, past family history, past medical history, past social history, past surgical history and problem list.     Objective:    BP 101/58 mmHg  Pulse 70  Ht 5\' 11"  (1.803 m)  Wt 179 lb (81.194 kg)  BMI 24.98 kg/m2   Lab Review GLUCOSE (mg/dL)  Date Value  16/10/960401/28/2016 70  12/13/2013 95  11/15/2013 196*   GLUCOSE, BLD (mg/dL)  Date Value  54/09/811901/03/2015 175*  10/05/2013 290*  10/04/2013  135*   CO2  Date Value  04/27/2014 29 mmol/L  04/11/2014 33 mEq/L*  12/13/2013 31 mmol/L*   BUN (mg/dL)  Date Value  14/78/295601/28/2016 61*  04/11/2014 62*  12/13/2013 36*  11/15/2013 35*  10/05/2013 43*  10/04/2013 44*   CREAT (mg/dL)  Date Value  21/30/865701/03/2015 1.77*  10/20/2012 1.17   CREATININE, SER (mg/dL)  Date Value  84/69/629501/28/2016 1.65*  12/13/2013 1.39*  11/15/2013 1.49*     Assessment:    Diabetes Mellitus type II, under inadequate control.    Plan:    1.  Rx changes: hold glimepiride (patient is instructed to call me if he starts to get BG readings over 200).  Recommended metformin 500mg  1 tablet bid (uti recheck serum creatinine I do not want to increase higher)  Recommended patient take second dose of bumetanide in afternoon instead of evening to decrease nocturnal urination. 2.  Education: Reviewed 'ABCs' of diabetes management (respective goals in parentheses):  A1C (<7), blood pressure (<130/80), and cholesterol (LDL <100).  Also spent about 10 minutes explaining MOA of metformin and glimepiride  3.  Discussed diet - educated on CHO counting and which food are high in CHO.  Also educated about reading labels with regards to serving sizes and CHO content. .4. Follow up: 1 month    Henrene Pastorammy Tanyia Grabbe, PharmD, CPP, CDE

## 2014-05-18 ENCOUNTER — Telehealth: Payer: Self-pay | Admitting: Pharmacist

## 2014-05-18 NOTE — Telephone Encounter (Signed)
Patient reports BG has been increasing since glimepiride d/c. BG has been 134, 157, 148, 197. Recommended she restart glimepiride at lower dose of 4mg  1/2 tablet with breakfast. Patient has medication at home - update med list but did not send in Rx yet.

## 2014-05-24 ENCOUNTER — Encounter: Payer: Self-pay | Admitting: Cardiology

## 2014-05-24 ENCOUNTER — Telehealth: Payer: Self-pay | Admitting: Family Medicine

## 2014-05-24 ENCOUNTER — Ambulatory Visit (INDEPENDENT_AMBULATORY_CARE_PROVIDER_SITE_OTHER): Payer: Commercial Managed Care - HMO | Admitting: Cardiology

## 2014-05-24 ENCOUNTER — Other Ambulatory Visit (INDEPENDENT_AMBULATORY_CARE_PROVIDER_SITE_OTHER): Payer: Commercial Managed Care - HMO

## 2014-05-24 VITALS — BP 88/60 | HR 44 | Ht 71.0 in | Wt 176.0 lb

## 2014-05-24 DIAGNOSIS — I5023 Acute on chronic systolic (congestive) heart failure: Secondary | ICD-10-CM

## 2014-05-24 DIAGNOSIS — R0602 Shortness of breath: Secondary | ICD-10-CM

## 2014-05-24 NOTE — Telephone Encounter (Signed)
I called patient's wife and she understands to stop the patient's Cymbalta

## 2014-05-24 NOTE — Patient Instructions (Signed)
The current medical regimen is effective;  continue present plan and medications.  [facility].  Follow up in 2 weeks with Dr Antoine PocheHochrein.  Thank you for choosing [facility]!!

## 2014-05-24 NOTE — Progress Notes (Signed)
HPI The patient presents for follow up of CAD and ischemic cardiomyopathy.   He is here with his wife.  Since I last saw him he was started on Cymbalta for management of his neuropathic pain.  He has not done well. He has been lethargic. His wife says he sleeping frequently. His weight is down. His blood pressure is lower. He actually has not been taking his Zaroxolyn at all this month as his weights have been down. For the most part he has also been holding his Lasix.  His appetite has decreased. He is not however describing any shortness of breath, PND or orthopnea. He's not having any chest pressure, neck or arm discomfort. He's had no increased lower extremity edema.   Allergies  Allergen Reactions  . Beta Adrenergic Blockers Other (See Comments)    Swollen hands, Per patient currently ([id number]) tolerating Coreg.  . Fish Oil Diarrhea  . Januvia [Sitagliptin Phosphate] Other (See Comments)    Leg cramps  . Tetanus-Diphtheria Toxoids Td Swelling    Current Outpatient Prescriptions  Medication Sig Dispense Refill  . acetaminophen (TYLENOL) 500 MG tablet Take 1 tablet (500 mg total) by mouth every 6 (six) hours as needed. 30 tablet 0  . aspirin 81 MG tablet Take 81 mg by mouth daily.    . BENFOTIAMINE PO Take 250 mg by mouth daily.    . bumetanide (BUMEX) 2 MG tablet Take 2 tablets( 4 mg)  in am and  Take 1 tablet (2 mg) in afternoon 270 tablet 3  . carvedilol (COREG) 3.125 MG tablet Take 1 tablet (3.125 mg total) by mouth at bedtime. 90 tablet 3  . DULoxetine (CYMBALTA) 30 MG capsule Take 1 capsule (30 mg total) by mouth daily. 30 capsule 3  . ezetimibe (ZETIA) 10 MG tablet Take 1 tablet (10 mg total) by mouth daily. 90 tablet 3  . fluticasone (FLONASE) 50 MCG/ACT nasal spray Place 1 spray into both nostrils daily as needed for allergies or rhinitis.    Marland Kitchen glimepiride (AMARYL) 4 MG tablet Take 0.5 tablets (2 mg total) by mouth daily before breakfast. 30 tablet 3  . lisinopril  (PRINIVIL,ZESTRIL) 5 MG tablet Take 0.5 tablets (2.5 mg total) by mouth daily. As directed 90 tablet 3  . Menthol-Methyl Salicylate (MUSCLE RUB) 10-15 % CREA Apply 1 application topically daily as needed for muscle pain.    . metFORMIN (GLUCOPHAGE) 500 MG tablet Take 1-3 tablets (500-1,500 mg total) by mouth 2 (two) times daily with a meal. Takes  in morning and 1500 in evening (Patient taking differently: Take 500 mg by mouth 2 (two) times daily with a meal. Takes  in morning and 500 in evening) 180 tablet 3  . metolazone (ZAROXOLYN) 2.5 MG tablet Take 1 tablet by mouth daily as needed 90 tablet 1  . NEOMYCIN-POLYMYXIN-HYDROCORTISONE (CORTISPORIN) 1 % SOLN otic solution Place 3 drops into the right ear 4 (four) times daily. 10 mL 0  . pravastatin (PRAVACHOL) 80 MG tablet Take 1 tablet (80 mg total) by mouth daily. 90 tablet 3  . trimethoprim-polymyxin b (POLYTRIM) ophthalmic solution   1   No current facility-administered medications for this visit.    Past Medical History  Diagnosis Date  . CAD (coronary artery disease)     a. s/p CABG 1994;  b.  Patent grafts 07/2011  . Chronic systolic CHF (congestive heart failure)     a. EF 25% in 2005;  b. Echo 07/2011: EF 20-25%  . Anemia   .  Hyperlipidemia   . Hyperkalemia     a. 07/2011 ->acei d/c'd.  . Ischemic cardiomyopathy   . Type II diabetes mellitus   . Stroke 01/05/2013    "possibility; lost hearing in left ear; not sure it wasn't caused by fluid pill"  . 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 Cardiac catheterization  1994; ~ 07/2011  . Coronary artery bypass graft  1994    "CABG X4", LIMA to the LAD, sequential SVG to first and second obtuse marginal, sequential SVG to the right coronary artery.  . Left heart catheterization with coronary/graft angiogram N/A 08/05/2011    Procedure: LEFT HEART CATHETERIZATION WITH Isabel CapriceORONARY/GRAFT ANGIOGRAM;  Surgeon: Laurey Moralealton S McLean, MD;  [facility];  Service: Cardiovascular;  Laterality: N/A;    ROS: As stated in the HPI and negative for all other systems.  PHYSICAL EXAM BP 88/60 mmHg  Pulse 44  Ht 5\' 11"  (1.803 m)  Wt 176 lb (79.833 kg)  BMI 24.56 kg/m2 GENERAL:  No distress but chronically ill-appearing NECK:  10 cm jugular venous distention at 45 degrees, waveform within normal limits, carotid upstroke brisk and symmetric, no bruits, no thyromegaly LUNGS:  Clear to auscultation bilaterally CHEST:  Unremarkable HEART:  PMI not displaced or sustained,S1 and S2 within normal limits, positive S3, no S4, no clicks, no rubs, no murmurs ABD:  Flat, positive bowel sounds normal in frequency in pitch, no bruits, no rebound, no guarding, no midline pulsatile mass, positive hepatomegaly, no splenomegaly, abdomen slightly distended EXT:  2 plus pulses throughout, moderate lower extremity edema, no cyanosis no clubbing. SKIN:  Perhaps slight jaundice NEURO:  nonfocal   ASSESSMENT AND PLAN  Chronic combined systolic and diastolic congestive heart failure -  I had a long discussion with the patient and his wife. His symptoms could be related to Cymbalta and I asked him to discuss this with Dr. [name]. However, I am quite concerned that this is secondary to low output heart failure. He is right to be holding his diuretics. I will check a comprehensive metabolic profile as he might also have hepatic congestion. I had a long and frank discussion with the patient and his wife about his poor prognosis. He needs advanced therapies in order to improve his survival and quality of life. I discussed hospitalization and possible inotropic therapy and device implant. He has not wanted any of this on repeated questioning in the past. He still is not ready to consent to this and wants to consider. Of note I will be checking some routine screening for amyloid.  CAD (coronary artery disease) -  The patient has no new sypmtoms. No further  cardiovascular testing is indicated. We will continue with aggressive risk reduction and meds as listed.   CKD: This is followed as above.  SYNCOPE:   He has not had any further syncope.He understands the risk of sudden death and has steadfastly refused to consider getting an ICD.   LEG PAIN: I suspect neuropathy.  I suggested benfotiamine.  He is also on Cymbalta. However, this could be causing symptoms and he needs to discuss this with Dr. [name].

## 2014-05-24 NOTE — Progress Notes (Signed)
Lab work for Dr Rollene RotundaJames Hochrein

## 2014-05-26 LAB — KAPPA/LAMBDA LIGHT CHAINS
IG LAMBDA FREE LIGHT CHAIN: 19.96 mg/L (ref 5.71–26.30)
Ig Kappa Free Light Chain: 31.24 mg/L — ABNORMAL HIGH (ref 3.30–19.40)
Kappa/Lambda FluidC Ratio: 1.57 (ref 0.26–1.65)

## 2014-05-26 LAB — PROTEIN ELECTROPHORESIS
A/G Ratio: 1.5 (ref 0.7–2.0)
ALBUMIN ELP: 3.5 g/dL (ref 3.2–5.6)
ALPHA 2: 0.6 g/dL (ref 0.4–1.2)
Alpha 1: 0.3 g/dL (ref 0.1–0.4)
BETA: 0.9 g/dL (ref 0.6–1.3)
Gamma Globulin: 0.6 g/dL (ref 0.5–1.6)
Globulin, Total: 2.4 g/dL (ref 2.0–4.5)

## 2014-05-26 LAB — CMP14+EGFR
ALBUMIN: 4 g/dL (ref 3.6–4.8)
ALK PHOS: 155 IU/L — AB (ref 39–117)
ALT: 690 IU/L — AB (ref 0–44)
AST: 559 IU/L — AB (ref 0–40)
Albumin/Globulin Ratio: 2.1 (ref 1.1–2.5)
BILIRUBIN TOTAL: 2.5 mg/dL — AB (ref 0.0–1.2)
BUN / CREAT RATIO: 23 — AB (ref 10–22)
BUN: 34 mg/dL — ABNORMAL HIGH (ref 8–27)
CHLORIDE: 92 mmol/L — AB (ref 97–108)
CO2: 25 mmol/L (ref 18–29)
Calcium: 8.8 mg/dL (ref 8.6–10.2)
Creatinine, Ser: 1.5 mg/dL — ABNORMAL HIGH (ref 0.76–1.27)
GFR, EST AFRICAN AMERICAN: 55 mL/min/{1.73_m2} — AB (ref >59)
GFR, EST NON AFRICAN AMERICAN: 47 mL/min/{1.73_m2} — AB (ref >59)
GLUCOSE: 141 mg/dL — AB (ref 65–99)
Globulin, Total: 1.9 g/dL (ref 1.5–4.5)
POTASSIUM: 4.2 mmol/L (ref 3.5–5.2)
Sodium: 133 mmol/L — ABNORMAL LOW (ref 134–144)
Total Protein: 5.9 g/dL — ABNORMAL LOW (ref 6.0–8.5)

## 2014-05-26 LAB — BRAIN NATRIURETIC PEPTIDE: BNP: 2458.2 pg/mL — ABNORMAL HIGH (ref 0.0–100.0)

## 2014-05-29 ENCOUNTER — Inpatient Hospital Stay (HOSPITAL_COMMUNITY)
Admission: AD | Admit: 2014-05-29 | Discharge: 2014-06-30 | DRG: 286 | Disposition: E | Payer: Commercial Managed Care - HMO | Source: Ambulatory Visit | Attending: Cardiology | Admitting: Cardiology

## 2014-05-29 ENCOUNTER — Encounter (HOSPITAL_COMMUNITY): Payer: Self-pay | Admitting: *Deleted

## 2014-05-29 ENCOUNTER — Inpatient Hospital Stay (HOSPITAL_COMMUNITY): Payer: Commercial Managed Care - HMO

## 2014-05-29 DIAGNOSIS — Z888 Allergy status to other drugs, medicaments and biological substances status: Secondary | ICD-10-CM

## 2014-05-29 DIAGNOSIS — E119 Type 2 diabetes mellitus without complications: Secondary | ICD-10-CM | POA: Diagnosis present

## 2014-05-29 DIAGNOSIS — Z951 Presence of aortocoronary bypass graft: Secondary | ICD-10-CM | POA: Diagnosis not present

## 2014-05-29 DIAGNOSIS — Z87891 Personal history of nicotine dependence: Secondary | ICD-10-CM

## 2014-05-29 DIAGNOSIS — I2510 Atherosclerotic heart disease of native coronary artery without angina pectoris: Secondary | ICD-10-CM | POA: Diagnosis present

## 2014-05-29 DIAGNOSIS — R34 Anuria and oliguria: Secondary | ICD-10-CM | POA: Diagnosis not present

## 2014-05-29 DIAGNOSIS — Z79899 Other long term (current) drug therapy: Secondary | ICD-10-CM | POA: Diagnosis not present

## 2014-05-29 DIAGNOSIS — I5023 Acute on chronic systolic (congestive) heart failure: Secondary | ICD-10-CM | POA: Diagnosis not present

## 2014-05-29 DIAGNOSIS — D649 Anemia, unspecified: Secondary | ICD-10-CM | POA: Diagnosis present

## 2014-05-29 DIAGNOSIS — R040 Epistaxis: Secondary | ICD-10-CM | POA: Diagnosis not present

## 2014-05-29 DIAGNOSIS — E785 Hyperlipidemia, unspecified: Secondary | ICD-10-CM | POA: Diagnosis present

## 2014-05-29 DIAGNOSIS — R570 Cardiogenic shock: Secondary | ICD-10-CM | POA: Diagnosis not present

## 2014-05-29 DIAGNOSIS — Z91018 Allergy to other foods: Secondary | ICD-10-CM

## 2014-05-29 DIAGNOSIS — I4892 Unspecified atrial flutter: Secondary | ICD-10-CM | POA: Diagnosis present

## 2014-05-29 DIAGNOSIS — I130 Hypertensive heart and chronic kidney disease with heart failure and stage 1 through stage 4 chronic kidney disease, or unspecified chronic kidney disease: Secondary | ICD-10-CM | POA: Diagnosis present

## 2014-05-29 DIAGNOSIS — H9193 Unspecified hearing loss, bilateral: Secondary | ICD-10-CM | POA: Diagnosis present

## 2014-05-29 DIAGNOSIS — I255 Ischemic cardiomyopathy: Secondary | ICD-10-CM | POA: Diagnosis present

## 2014-05-29 DIAGNOSIS — R251 Tremor, unspecified: Secondary | ICD-10-CM | POA: Diagnosis not present

## 2014-05-29 DIAGNOSIS — Z8249 Family history of ischemic heart disease and other diseases of the circulatory system: Secondary | ICD-10-CM | POA: Diagnosis not present

## 2014-05-29 DIAGNOSIS — Z887 Allergy status to serum and vaccine status: Secondary | ICD-10-CM | POA: Diagnosis not present

## 2014-05-29 DIAGNOSIS — R011 Cardiac murmur, unspecified: Secondary | ICD-10-CM | POA: Diagnosis not present

## 2014-05-29 DIAGNOSIS — N183 Chronic kidney disease, stage 3 (moderate): Secondary | ICD-10-CM | POA: Diagnosis present

## 2014-05-29 DIAGNOSIS — R188 Other ascites: Secondary | ICD-10-CM | POA: Diagnosis present

## 2014-05-29 DIAGNOSIS — R531 Weakness: Secondary | ICD-10-CM | POA: Diagnosis not present

## 2014-05-29 DIAGNOSIS — I493 Ventricular premature depolarization: Secondary | ICD-10-CM | POA: Diagnosis present

## 2014-05-29 DIAGNOSIS — I509 Heart failure, unspecified: Secondary | ICD-10-CM

## 2014-05-29 DIAGNOSIS — I4891 Unspecified atrial fibrillation: Secondary | ICD-10-CM | POA: Diagnosis not present

## 2014-05-29 DIAGNOSIS — Z66 Do not resuscitate: Secondary | ICD-10-CM | POA: Diagnosis present

## 2014-05-29 DIAGNOSIS — R740 Nonspecific elevation of levels of transaminase and lactic acid dehydrogenase [LDH]: Secondary | ICD-10-CM | POA: Diagnosis present

## 2014-05-29 DIAGNOSIS — N170 Acute kidney failure with tubular necrosis: Secondary | ICD-10-CM | POA: Diagnosis not present

## 2014-05-29 DIAGNOSIS — R630 Anorexia: Secondary | ICD-10-CM | POA: Diagnosis not present

## 2014-05-29 DIAGNOSIS — Z515 Encounter for palliative care: Secondary | ICD-10-CM | POA: Diagnosis not present

## 2014-05-29 DIAGNOSIS — R1110 Vomiting, unspecified: Secondary | ICD-10-CM | POA: Diagnosis not present

## 2014-05-29 DIAGNOSIS — R066 Hiccough: Secondary | ICD-10-CM | POA: Diagnosis not present

## 2014-05-29 DIAGNOSIS — E871 Hypo-osmolality and hyponatremia: Secondary | ICD-10-CM | POA: Diagnosis not present

## 2014-05-29 DIAGNOSIS — Z7982 Long term (current) use of aspirin: Secondary | ICD-10-CM

## 2014-05-29 DIAGNOSIS — G629 Polyneuropathy, unspecified: Secondary | ICD-10-CM | POA: Diagnosis present

## 2014-05-29 DIAGNOSIS — Z01818 Encounter for other preprocedural examination: Secondary | ICD-10-CM

## 2014-05-29 DIAGNOSIS — R112 Nausea with vomiting, unspecified: Secondary | ICD-10-CM | POA: Diagnosis not present

## 2014-05-29 DIAGNOSIS — N179 Acute kidney failure, unspecified: Secondary | ICD-10-CM | POA: Diagnosis not present

## 2014-05-29 DIAGNOSIS — H6091 Unspecified otitis externa, right ear: Secondary | ICD-10-CM | POA: Diagnosis present

## 2014-05-29 DIAGNOSIS — R7401 Elevation of levels of liver transaminase levels: Secondary | ICD-10-CM | POA: Diagnosis present

## 2014-05-29 DIAGNOSIS — Z8673 Personal history of transient ischemic attack (TIA), and cerebral infarction without residual deficits: Secondary | ICD-10-CM

## 2014-05-29 DIAGNOSIS — I483 Typical atrial flutter: Secondary | ICD-10-CM | POA: Diagnosis not present

## 2014-05-29 DIAGNOSIS — E875 Hyperkalemia: Secondary | ICD-10-CM | POA: Diagnosis present

## 2014-05-29 DIAGNOSIS — R945 Abnormal results of liver function studies: Secondary | ICD-10-CM

## 2014-05-29 DIAGNOSIS — R0602 Shortness of breath: Secondary | ICD-10-CM

## 2014-05-29 DIAGNOSIS — I481 Persistent atrial fibrillation: Secondary | ICD-10-CM | POA: Diagnosis not present

## 2014-05-29 DIAGNOSIS — R7989 Other specified abnormal findings of blood chemistry: Secondary | ICD-10-CM

## 2014-05-29 LAB — TROPONIN I
TROPONIN I: 0.03 ng/mL (ref <0.031)
Troponin I: 0.1 ng/mL — ABNORMAL HIGH (ref <0.031)

## 2014-05-29 LAB — COMPREHENSIVE METABOLIC PANEL
ALT: 230 U/L — AB (ref 0–53)
AST: 63 U/L — ABNORMAL HIGH (ref 0–37)
Albumin: 3.4 g/dL — ABNORMAL LOW (ref 3.5–5.2)
Alkaline Phosphatase: 124 U/L — ABNORMAL HIGH (ref 39–117)
Anion gap: 10 (ref 5–15)
BUN: 22 mg/dL (ref 6–23)
CO2: 28 mmol/L (ref 19–32)
Calcium: 8.7 mg/dL (ref 8.4–10.5)
Chloride: 92 mmol/L — ABNORMAL LOW (ref 96–112)
Creatinine, Ser: 1.41 mg/dL — ABNORMAL HIGH (ref 0.50–1.35)
GFR calc Af Amer: 58 mL/min — ABNORMAL LOW (ref >90)
GFR, EST NON AFRICAN AMERICAN: 50 mL/min — AB (ref >90)
Glucose, Bld: 197 mg/dL — ABNORMAL HIGH (ref 70–99)
POTASSIUM: 3.7 mmol/L (ref 3.5–5.1)
SODIUM: 130 mmol/L — AB (ref 135–145)
Total Bilirubin: 2.2 mg/dL — ABNORMAL HIGH (ref 0.3–1.2)
Total Protein: 6.2 g/dL (ref 6.0–8.3)

## 2014-05-29 LAB — CBC WITH DIFFERENTIAL/PLATELET
BASOS PCT: 1 % (ref 0–1)
Basophils Absolute: 0 10*3/uL (ref 0.0–0.1)
Eosinophils Absolute: 0.1 10*3/uL (ref 0.0–0.7)
Eosinophils Relative: 2 % (ref 0–5)
HEMATOCRIT: 31.1 % — AB (ref 39.0–52.0)
Hemoglobin: 9.8 g/dL — ABNORMAL LOW (ref 13.0–17.0)
LYMPHS PCT: 22 % (ref 12–46)
Lymphs Abs: 1.1 10*3/uL (ref 0.7–4.0)
MCH: 22.8 pg — AB (ref 26.0–34.0)
MCHC: 31.5 g/dL (ref 30.0–36.0)
MCV: 72.3 fL — AB (ref 78.0–100.0)
MONO ABS: 0.6 10*3/uL (ref 0.1–1.0)
Monocytes Relative: 13 % — ABNORMAL HIGH (ref 3–12)
Neutro Abs: 3 10*3/uL (ref 1.7–7.7)
Neutrophils Relative %: 62 % (ref 43–77)
Platelets: 222 10*3/uL (ref 150–400)
RBC: 4.3 MIL/uL (ref 4.22–5.81)
RDW: 17.2 % — ABNORMAL HIGH (ref 11.5–15.5)
WBC: 4.7 10*3/uL (ref 4.0–10.5)

## 2014-05-29 LAB — PROTIME-INR
INR: 1.33 (ref 0.00–1.49)
Prothrombin Time: 16.6 seconds — ABNORMAL HIGH (ref 11.6–15.2)

## 2014-05-29 LAB — GLUCOSE, CAPILLARY
Glucose-Capillary: 162 mg/dL — ABNORMAL HIGH (ref 70–99)
Glucose-Capillary: 206 mg/dL — ABNORMAL HIGH (ref 70–99)
Glucose-Capillary: 198 mg/dL — ABNORMAL HIGH (ref 70–99)

## 2014-05-29 MED ORDER — ASPIRIN 81 MG PO CHEW
81.0000 mg | CHEWABLE_TABLET | Freq: Every day | ORAL | Status: DC
  Administered 2014-05-29 – 2014-06-16 (×19): 81 mg via ORAL
  Filled 2014-05-29 (×21): qty 1

## 2014-05-29 MED ORDER — BUMETANIDE 2 MG PO TABS
2.0000 mg | ORAL_TABLET | Freq: Two times a day (BID) | ORAL | Status: DC | PRN
  Filled 2014-05-29: qty 1

## 2014-05-29 MED ORDER — FUROSEMIDE 10 MG/ML IJ SOLN
80.0000 mg | Freq: Two times a day (BID) | INTRAMUSCULAR | Status: DC
  Administered 2014-05-29: 80 mg via INTRAVENOUS
  Filled 2014-05-29 (×2): qty 8

## 2014-05-29 MED ORDER — BENFOTIAMINE 150 MG PO CAPS: 250.0000 mg | ORAL_CAPSULE | Freq: Every day | ORAL | Status: DC

## 2014-05-29 MED ORDER — INSULIN ASPART 100 UNIT/ML ~~LOC~~ SOLN
0.0000 [IU] | Freq: Three times a day (TID) | SUBCUTANEOUS | Status: DC
  Administered 2014-05-29 – 2014-05-30 (×2): 3 [IU] via SUBCUTANEOUS
  Administered 2014-05-30: 2 [IU] via SUBCUTANEOUS
  Administered 2014-05-31: 3 [IU] via SUBCUTANEOUS
  Administered 2014-05-31: 2 [IU] via SUBCUTANEOUS
  Administered 2014-05-31 – 2014-06-01 (×2): 1 [IU] via SUBCUTANEOUS
  Administered 2014-06-01: 2 [IU] via SUBCUTANEOUS
  Administered 2014-06-01: 5 [IU] via SUBCUTANEOUS

## 2014-05-29 MED ORDER — LISINOPRIL 2.5 MG PO TABS
2.5000 mg | ORAL_TABLET | Freq: Every day | ORAL | Status: DC
  Administered 2014-05-30 – 2014-06-01 (×3): 2.5 mg via ORAL
  Filled 2014-05-29 (×4): qty 1

## 2014-05-29 MED ORDER — MUSCLE RUB 10-15 % EX CREA: TOPICAL_CREAM | CUTANEOUS | Status: DC | PRN

## 2014-05-29 MED ORDER — SODIUM CHLORIDE 0.9 % IV SOLN: 250.0000 mL | INTRAVENOUS | Status: DC | PRN

## 2014-05-29 MED ORDER — SODIUM CHLORIDE 0.9 % IJ SOLN
3.0000 mL | Freq: Two times a day (BID) | INTRAMUSCULAR | Status: DC
  Administered 2014-05-29 (×2): 3 mL via INTRAVENOUS
  Administered 2014-05-30: 10 mL via INTRAVENOUS
  Administered 2014-06-01 – 2014-06-02 (×2): 3 mL via INTRAVENOUS

## 2014-05-29 MED ORDER — SODIUM CHLORIDE 0.9 % IJ SOLN: 3.0000 mL | INTRAMUSCULAR | Status: DC | PRN

## 2014-05-29 MED ORDER — EZETIMIBE 10 MG PO TABS
10.0000 mg | ORAL_TABLET | Freq: Every day | ORAL | Status: DC
  Administered 2014-05-29 – 2014-06-15 (×18): 10 mg via ORAL
  Filled 2014-05-29 (×22): qty 1

## 2014-05-29 MED ORDER — ONDANSETRON HCL 4 MG/2ML IJ SOLN: 4.0000 mg | Freq: Four times a day (QID) | INTRAMUSCULAR | Status: DC | PRN

## 2014-05-29 MED ORDER — NEOMYCIN-POLYMYXIN-HC 1 % OT SOLN
3.0000 [drp] | Freq: Four times a day (QID) | OTIC | Status: DC
  Administered 2014-05-29 – 2014-06-06 (×29): 3 [drp] via OTIC
  Filled 2014-05-29 (×3): qty 10

## 2014-05-29 MED ORDER — TROLAMINE SALICYLATE 10 % EX CREA
TOPICAL_CREAM | CUTANEOUS | Status: DC | PRN
  Filled 2014-05-29: qty 85

## 2014-05-29 MED ORDER — MUSCLE RUB 10-15 % EX CREA
TOPICAL_CREAM | CUTANEOUS | Status: DC | PRN
  Administered 2014-05-29: 23:00:00 via TOPICAL
  Administered 2014-06-03: 1 via TOPICAL
  Administered 2014-06-12 – 2014-06-14 (×2): via TOPICAL
  Filled 2014-05-29: qty 85

## 2014-05-29 MED ORDER — HEPARIN SODIUM (PORCINE) 5000 UNIT/ML IJ SOLN
5000.0000 [IU] | Freq: Three times a day (TID) | INTRAMUSCULAR | Status: DC
  Administered 2014-05-29 (×2): 5000 [IU] via SUBCUTANEOUS
  Filled 2014-05-29 (×2): qty 1

## 2014-05-29 MED ORDER — POTASSIUM CHLORIDE ER 10 MEQ PO TBCR
40.0000 meq | EXTENDED_RELEASE_TABLET | Freq: Once | ORAL | Status: AC
  Administered 2014-05-29: 40 meq via ORAL
  Filled 2014-05-29 (×2): qty 4

## 2014-05-29 MED ORDER — FLUTICASONE PROPIONATE 50 MCG/ACT NA SUSP
1.0000 | Freq: Every day | NASAL | Status: DC | PRN
  Filled 2014-05-29: qty 16

## 2014-05-29 MED ORDER — LIVING WELL WITH DIABETES BOOK
Freq: Once | Status: AC
  Administered 2014-05-31: 08:00:00
  Filled 2014-05-29 (×3): qty 1

## 2014-05-29 MED ORDER — ACETAMINOPHEN 325 MG PO TABS: 650.0000 mg | ORAL_TABLET | ORAL | Status: DC | PRN

## 2014-05-29 MED ORDER — SODIUM CHLORIDE 0.9 % IJ SOLN
3.0000 mL | Freq: Two times a day (BID) | INTRAMUSCULAR | Status: DC
  Administered 2014-05-29 (×2): 3 mL via INTRAVENOUS

## 2014-05-29 NOTE — Progress Notes (Signed)
Spoke to Dr. Zachery ConchFriedman about leg pain, [facility]

## 2014-05-29 NOTE — Progress Notes (Addendum)
Inpatient Diabetes Program Recommendations  AACE/ADA: New Consensus Statement on Inpatient Glycemic Control (2013)  Target Ranges:  Prepandial:   less than 140 mg/dL      Peak postprandial:   less than 180 mg/dL (1-2 hours)      Critically ill patients:  140 - 180 mg/dL   Consult received regarding poorly controlled diabetes. Pt for heart cath tomorrow am-will talk with patient after the procedure regarding his dm regimen (food, meds, exercise, etc) Note patient has CRF stage III which most definitely needs to be addressed regarding dietary restrictions. Will order a dietician consult as well. Pt has had low Hgb levels as well which may highly influence the HgbA1C value.  Noted pt's home regimen incudes amaryl 2 mg/day. Pt may do better on a extremely short acting sulfonylurea-the class of meglitinides, Prandin or Starlix given 15 minutes before or at the start of each meal.  (Pt would not take if not eating). Starlix (Nateglinide) starting at 60 mg up to 120 mg tidwc may be the better choice as it is listed as no adjustment in dosing needs be made for mild to severe renal insufficiency.   Otherwise, pt could use novolog correction scale and/or meal coverage (3 units tidwc) and a sensitive correction tidwc. Will follow glucose pattern while here and assess further what may be helpful for glucose control.  Also noted that metformin total daily dose of 2000 mg/day is part of home regimen. With chronic renal disease and elevated Creatinine levels, metformin is contraindicated for potential for acidosis.  Will talk with patient while here to assess further options and/or need for education.  Thank you Lenor CoffinAnn Nahomi Hegner, RN, MSN, CDE  Diabetes Inpatient Program Office: [phone] Pager: [phone] 8:00 am to 5:00 pm

## 2014-05-29 NOTE — Progress Notes (Signed)
Pharmacy called RN stated that one of the patients medications is not available, and would need home medication. RN explained to Pt with Wife at bedside. Pt stated that he no does not take that medication any more (Benfotiamine Caps)

## 2014-05-29 NOTE — H&P (Signed)
Patient ID: Luke Pittman MRN: [id number], DOB/AGE: September 16, 1945   Admit date: 05/05/2014   Primary Physician: Rudi Heap, MD Primary Cardiologist: Dr. Antoine Poche  Pt. Profile:  Luke Pittman is a pleasant 69 year old Caucasian male with PMH of CAD s/p CABG in 1994, history of ischemic cardiomyopathy, chronic biventricular heart failure with EF 20-25% on Echo in 2013, HLD, DM and stage III CKD directed admitted for elevated transaminase and concern for R heart failure and low output failure vs recent Cymbalta use.   Problem List  Past Medical History  Diagnosis Date  . CAD (coronary artery disease)     a. s/p CABG 1994;  b.  Patent grafts 07/2011  . Chronic systolic CHF (congestive heart failure)     a. EF 25% in 2005;  b. Echo 07/2011: EF 20-25%  . Anemia   . Hyperlipidemia   . Hyperkalemia     a. 07/2011 ->acei d/c'd.  . Ischemic cardiomyopathy   . Type II diabetes mellitus   . Stroke 01/05/2013    "possibility; lost hearing in left ear; not sure it wasn't caused by fluid pill"  . 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 Cardiac catheterization  1994; ~ 07/2011  . Coronary artery bypass graft  1994    "CABG X4", LIMA to the LAD, sequential SVG to first and second obtuse marginal, sequential SVG to the right coronary artery.  . Left heart catheterization with coronary/graft angiogram N/A 08/05/2011    Procedure: LEFT HEART CATHETERIZATION WITH Isabel Caprice;  Surgeon: Laurey Morale, MD;  Location: [facility] CATH LAB;  Service: Cardiovascular;  Laterality: N/A;     Allergies  Allergies  Allergen Reactions  . Beta Adrenergic Blockers Other (See Comments)    Swollen hands, Per patient currently ([id number]) tolerating Coreg.  . Fish Oil Diarrhea  . Januvia [Sitagliptin Phosphate] Other (See Comments)    Leg cramps  . Tetanus-Diphtheria Toxoids Td Swelling    HPI  Luke Pittman is a pleasant 70 year old Caucasian male with PMH of CAD  s/p CABG in 1994, history of ischemic cardiomyopathy, chronic biventricular heart failure with EF 20-25% on Echo in 2013, HLD, DM and stage III CKD. It appears patient underwent four-vessel CABG with LIMA to LAD, sequential SVG to OM1/OM 2, SVG to RCA in 1994. His last cardiac catheterization in May 2013 showed patent grafts. His last echocardiogram on 08/05/2011 showed EF 20-25%, mild MR, moderately dilated RV.   According to the wife, she has been concerned that the patient has not been eating very well for the past several weeks. He also complaining of fatigue and decreased functional ability for the last several weeks as well. She has noticed him to become short of breath after walking only short distances. During the mean time, patient denies any exertional chest discomfort, lower extremity edema, orthopnea or paroxysmal nocturnal dyspnea. He continued to use one pillows to this day without significant SOB. He states he does not take Bumex every single day and would only take it if his weight increased by more than 3 pounds above the baseline weight of 171-172lbs. He has not used Zaroxolyn for over a month. He was recently started on Cymbalta for his neuropathic pain. Since then, he has been more lethargic and sleeps frequently. His weight is probably down at this time. He was seen in the clinic by Dr. Antoine Poche on 05/24/2014, who was concerned about the use of Symbalta in this patient with  significant heart failure. He is also concerned about low output failure with hepatic congestion. He had talked to the patient and his wife in the past regarding potential need for hospitalization possible inotropic therapy and device implant, patient appears to not want any of this on repeated questioning in the past, and he is still not ready to consent to this and want consider.  Complete metabolic panel obtained on 05/24/2014 showed creatinine 1.5, sodium 133, significantly elevated AST and ALT close to 600, alkaline  phosphatase 155, BUN to creatinine ratio 23. His BNP was 2458, whereas it was 1400 seven month ago. Given the significantly abnormal laboratory finding, patient was referred to [facility] for direct admission and rule out low output heart failure.   Home Medications  Prior to Admission medications   Medication Sig Start Date End Date Taking? Authorizing Provider  acetaminophen (TYLENOL) 500 MG tablet Take 1 tablet (500 mg total) by mouth every 6 (six) hours as needed. 05/11/14   Tammy Eckard, PHARMD  aspirin 81 MG tablet Take 81 mg by mouth daily.    Historical Provider, MD  BENFOTIAMINE PO Take 250 mg by mouth daily.    Historical Provider, MD  bumetanide (BUMEX) 2 MG tablet Take 2 tablets( 4 mg)  in am and  Take 1 tablet (2 mg) in afternoon 05/11/14   Tammy Eckard, PHARMD  carvedilol (COREG) 3.125 MG tablet Take 1 tablet (3.125 mg total) by mouth at bedtime. 12/13/13 12/13/14  Ernestina Penna, MD  DULoxetine (CYMBALTA) 30 MG capsule Take 1 capsule (30 mg total) by mouth daily. 04/28/14   Ernestina Penna, MD  ezetimibe (ZETIA) 10 MG tablet Take 1 tablet (10 mg total) by mouth daily. 04/07/13   Ernestina Penna, MD  fluticasone (FLONASE) 50 MCG/ACT nasal spray Place 1 spray into both nostrils daily as needed for allergies or rhinitis.    Historical Provider, MD  glimepiride (AMARYL) 4 MG tablet Take 0.5 tablets (2 mg total) by mouth daily before breakfast. 05/18/14   Tammy Eckard, PHARMD  lisinopril (PRINIVIL,ZESTRIL) 5 MG tablet Take 0.5 tablets (2.5 mg total) by mouth daily. As directed 05/11/14   Tammy Eckard, PHARMD  Menthol-Methyl Salicylate (MUSCLE RUB) 10-15 % CREA Apply 1 application topically daily as needed for muscle pain.    Historical Provider, MD  metFORMIN (GLUCOPHAGE) 500 MG tablet Take 1-3 tablets (500-1,500 mg total) by mouth 2 (two) times daily with a meal. Takes  in morning and 1500 in evening Patient taking differently: Take 500 mg by mouth 2 (two) times daily with a meal.  Takes  in morning and 500 in evening 04/27/14   Ernestina Penna, MD  metolazone (ZAROXOLYN) 2.5 MG tablet Take 1 tablet by mouth daily as needed 04/12/14   Rollene Rotunda, MD  NEOMYCIN-POLYMYXIN-HYDROCORTISONE (CORTISPORIN) 1 % SOLN otic solution Place 3 drops into the right ear 4 (four) times daily. 04/27/14   Ernestina Penna, MD  pravastatin (PRAVACHOL) 80 MG tablet Take 1 tablet (80 mg total) by mouth daily. 12/13/13   Ernestina Penna, MD  trimethoprim-polymyxin b Joaquim Lai) ophthalmic solution  05/08/14   Historical Provider, MD    Family History  Family History  Problem Relation Age of Onset  . Heart failure Mother   . Heart attack Father 55  . Heart attack Brother 31    Deceased at 23 from heart failure    Social History  History   Social History  . Marital Status: Married    Spouse Name:  N/A  . Number of Children: 2  . Years of Education: N/A   Occupational History  . Not on file.   Social History Main Topics  . Smoking status: Former Smoker -- 0.50 packs/day for 1 years    Types: Cigarettes  . Smokeless tobacco: Never Used     Comment: "quit smoking when I was ~ 16"  . Alcohol Use: Yes     Comment: "drank some alcohol years and years ago"  . Drug Use: No  . Sexual Activity: No   Other Topics Concern  . Not on file   Social History Narrative   Lives in [place], [place] with wife.      Review of Systems General:  No chills, fever, night sweats or weight changes.  Cardiovascular:  No chest pain, edema, orthopnea, palpitations, paroxysmal nocturnal dyspnea. +dyspnea on exertion Dermatological: No rash, lesions/masses Respiratory: No cough, dyspnea Urologic: No hematuria, dysuria Abdominal:   No nausea, vomiting, diarrhea, bright red blood per rectum, melena, or hematemesis Neurologic:  No visual changes, changes in mental status. +wkns, All other systems reviewed and are otherwise negative except as noted above.  Physical Exam  Blood pressure 111/61, pulse 103,  temperature 97.5 F (36.4 C), temperature source Oral, resp. rate 20, height  (1.803 m), weight 173 lb 12.8 oz (78.835 kg), SpO2 100 %.  General: Pleasant, NAD Psych: Normal affect. Neuro: Alert and oriented X 3. Moves all extremities spontaneously. HEENT: Normal  Neck: Supple without bruits JVP 14 cm Lungs:  Resp regular and unlabored, CTA. Heart: RRR no s3, s4, or murmurs. Abdomen: Soft, non-tender, mildly distended, BS + x 4.  Extremities: No clubbing, cyanosis. DP/PT/Radials 2+ and equal bilaterally. 1+ edema to knees bilaterally.   Labs  Troponin (Point of Care Test) No results for input(s): TROPIPOC in the last 72 hours. No results for input(s): CKTOTAL, CKMB, TROPONINI in the last 72 hours. Lab Results  Component Value Date   WBC 7.4 04/27/2014   HGB 10.0* 04/27/2014   HCT 32.1* 04/27/2014   MCV 78* 04/27/2014   PLT 162 04/27/2014    Recent Labs Lab 05/24/14 1245  NA 133*  K 4.2  CL 92*  CO2 25  BUN 34*  CREATININE 1.50*  CALCIUM 8.8  PROT 5.9*  BILITOT 2.5*  ALKPHOS 155*  ALT 690*  AST 559*  GLUCOSE 141*   Lab Results  Component Value Date   CHOL 109 04/27/2014   HDL 51 04/27/2014   LDLCALC 76 12/13/2013   TRIG 42 04/27/2014   No results found for: DDIMER   Radiology/Studies  No results found.  ECG  pending  Echocardiogram 08/05/2011  - Left ventricle: The cavity size was severely dilated. Wall thickness was normal. Systolic function was severely reduced. The estimated ejection fraction was in the range of 20% to 25%. - Mitral valve: Mild regurgitation. - Left atrium: The atrium was moderately dilated. - Right ventricle: The cavity size was moderately dilated. - Right atrium: The atrium was mildly dilated.    ASSESSMENT AND PLAN  1. Elevated transaminase  - Unclear if due to the use of Cymbalta versus right heart failure/low output failure/hepatic venous congestion  - unlikely caused by lipitor as patient has been on lipitor  for many years, however will hold for now given acute injury  - hold coreg given concern of decompensating HF  - will discuss with Dr. Shirlee Latch, may differentiate with RHC  2. Chronic biventricular failure  - lungs appear to be CTA, mild  JVD on hepatic compression, symptom maybe related to R heart failure and low output, however has not seen significant decrease in renal function  - pt appears to be very strict in managing his weight and make sure his weight is around 171-172lbs range. With recent decreased appetite, his baseline weight may have decreased slightly.  - will check for repeat echo. Check CXR however lung appear to be clear on exam   3. CAD s/p CABG in 1994  - LIMA to LAD, sequential SVG to OM1/OM 2, SVG to RCA in 1994  - cath 2013 patent grafts  4. history of ischemic cardiomyopathy 5. HLD 6. DM 7. stage III CKD   Signed, Azalee CourseMeng, Hao, PA-C 05/23/2014, 11:38 AM  1. Acute on chronic systolic CHF: EF 60-45%20-25% by echo in 5/13.  Today, he presents with NYHA class IIIb symptoms (dyspnea and profound fatigue), poor appetite, and markedly elevated transaminases.  He is volume overloaded on exam.  BP is stable.  Given symptoms, I am concerned for low output heart failure.  He has not been using much Bumex (takes it based on his weight), but weight may be staying stable due to loss of muscle and fat mass in setting of poor appetite.  - Hold Coreg for now.  - Continue low dose lisinopril.   - Lasix 80 mg IV bid.  - He will need RHC to assess filling pressures and cardiac output.  I will arrange for this tomorrow.  - He has not wanted ICD in the past.  QRS has not been prolonged so not CRT candidate.  Will need to repeat ECG and will need to reassess ICD issues eventually.  - RHC will help us decide if he is nearing the need to consider advanced therapies.  - Needs CXR.  2. AKD: Creatinine elevated but at his baseline.  Follow closely with diuresis.  3. CAD: S/p CABG.  Cath in 2013 with  patent grafts.  No chest pain.  Continue ASA, will hold statin for now with increased LFTs.  4. Elevated transaminases: ?Hepatic congestion from CHF.  He is clearly volume overloaded.  I will get abdominal US to assess liver and look for ascites.  Hopefully this will improve with diuresis. Will also check HCV.  5. Diabetes: Sliding scale, consult diabetes coordinator.   Marca AnconaDalton McLean 05/08/2014 1:37 PM

## 2014-05-29 NOTE — Progress Notes (Signed)
Pt C/O neuropathic pain in his feet. States he uses bengay. MD notified

## 2014-05-29 NOTE — Progress Notes (Signed)
Updated Dr. Zachery ConchFriedman of frequent PVCs, patient has no symptoms.

## 2014-05-30 ENCOUNTER — Encounter (HOSPITAL_COMMUNITY): Admission: AD | Disposition: E | Payer: Self-pay | Source: Ambulatory Visit | Attending: Cardiology

## 2014-05-30 DIAGNOSIS — I509 Heart failure, unspecified: Secondary | ICD-10-CM

## 2014-05-30 HISTORY — PX: RIGHT HEART CATHETERIZATION: SHX5447

## 2014-05-30 LAB — PROTIME-INR
INR: 1.38 (ref 0.00–1.49)
Prothrombin Time: 17.1 seconds — ABNORMAL HIGH (ref 11.6–15.2)

## 2014-05-30 LAB — COMPREHENSIVE METABOLIC PANEL
ALBUMIN: 3.5 g/dL (ref 3.5–5.2)
ALT: 211 U/L — ABNORMAL HIGH (ref 0–53)
ANION GAP: 10 (ref 5–15)
AST: 56 U/L — AB (ref 0–37)
Alkaline Phosphatase: 120 U/L — ABNORMAL HIGH (ref 39–117)
BILIRUBIN TOTAL: 2 mg/dL — AB (ref 0.3–1.2)
BUN: 25 mg/dL — AB (ref 6–23)
CHLORIDE: 94 mmol/L — AB (ref 96–112)
CO2: 24 mmol/L (ref 19–32)
CREATININE: 1.51 mg/dL — AB (ref 0.50–1.35)
Calcium: 8.6 mg/dL (ref 8.4–10.5)
GFR calc Af Amer: 53 mL/min — ABNORMAL LOW (ref >90)
GFR calc non Af Amer: 46 mL/min — ABNORMAL LOW (ref >90)
Glucose, Bld: 202 mg/dL — ABNORMAL HIGH (ref 70–99)
POTASSIUM: 4.4 mmol/L (ref 3.5–5.1)
Sodium: 128 mmol/L — ABNORMAL LOW (ref 135–145)
TOTAL PROTEIN: 6.1 g/dL (ref 6.0–8.3)

## 2014-05-30 LAB — MRSA PCR SCREENING: MRSA BY PCR: NEGATIVE

## 2014-05-30 LAB — CREATININE, SERUM
CREATININE: 1.55 mg/dL — AB (ref 0.50–1.35)
GFR calc Af Amer: 51 mL/min — ABNORMAL LOW (ref >90)
GFR, EST NON AFRICAN AMERICAN: 44 mL/min — AB (ref >90)

## 2014-05-30 LAB — GLUCOSE, CAPILLARY
GLUCOSE-CAPILLARY: 172 mg/dL — AB (ref 70–99)
GLUCOSE-CAPILLARY: 185 mg/dL — AB (ref 70–99)
Glucose-Capillary: 158 mg/dL — ABNORMAL HIGH (ref 70–99)
Glucose-Capillary: 246 mg/dL — ABNORMAL HIGH (ref 70–99)
Glucose-Capillary: 175 mg/dL — ABNORMAL HIGH (ref 70–99)

## 2014-05-30 LAB — POCT I-STAT 3, ART BLOOD GAS (G3+)
Acid-base deficit: 2 mmol/L (ref 0.0–2.0)
Bicarbonate: 22.1 mEq/L (ref 20.0–24.0)
O2 Saturation: 95 %
PCO2 ART: 33.4 mmHg — AB (ref 35.0–45.0)
PH ART: 7.43 (ref 7.350–7.450)
PO2 ART: 70 mmHg — AB (ref 80.0–100.0)
TCO2: 23 mmol/L (ref 0–100)

## 2014-05-30 LAB — POCT I-STAT 3, VENOUS BLOOD GAS (G3P V)
Bicarbonate: 25 mEq/L — ABNORMAL HIGH (ref 20.0–24.0)
O2 SAT: 34 %
TCO2: 26 mmol/L (ref 0–100)
pCO2, Ven: 39.5 mmHg — ABNORMAL LOW (ref 45.0–50.0)
pH, Ven: 7.409 — ABNORMAL HIGH (ref 7.250–7.300)
pO2, Ven: 21 mmHg — CL (ref 30.0–45.0)

## 2014-05-30 LAB — CBC
HEMATOCRIT: 30.3 % — AB (ref 39.0–52.0)
HEMOGLOBIN: 9.7 g/dL — AB (ref 13.0–17.0)
MCH: 23.4 pg — ABNORMAL LOW (ref 26.0–34.0)
MCHC: 32 g/dL (ref 30.0–36.0)
MCV: 73.2 fL — AB (ref 78.0–100.0)
Platelets: 207 10*3/uL (ref 150–400)
RBC: 4.14 MIL/uL — ABNORMAL LOW (ref 4.22–5.81)
RDW: 17.4 % — AB (ref 11.5–15.5)
WBC: 5.2 10*3/uL (ref 4.0–10.5)

## 2014-05-30 LAB — MAGNESIUM: Magnesium: 1.5 mg/dL (ref 1.5–2.5)

## 2014-05-30 LAB — HEPATITIS C ANTIBODY: HCV Ab: REACTIVE — AB

## 2014-05-30 LAB — TROPONIN I: Troponin I: 0.03 ng/mL (ref <0.031)

## 2014-05-30 SURGERY — RIGHT HEART CATH

## 2014-05-30 MED ORDER — SODIUM CHLORIDE 0.9 % IJ SOLN
3.0000 mL | Freq: Two times a day (BID) | INTRAMUSCULAR | Status: DC
  Administered 2014-05-30 – 2014-06-03 (×10): 3 mL via INTRAVENOUS

## 2014-05-30 MED ORDER — FUROSEMIDE 10 MG/ML IJ SOLN
80.0000 mg | Freq: Three times a day (TID) | INTRAMUSCULAR | Status: DC
  Administered 2014-05-30 – 2014-06-01 (×5): 80 mg via INTRAVENOUS
  Filled 2014-05-30 (×8): qty 8

## 2014-05-30 MED ORDER — FENTANYL CITRATE 0.05 MG/ML IJ SOLN
INTRAMUSCULAR | Status: AC
  Administered 2014-05-30: 25 ug
  Filled 2014-05-30: qty 2

## 2014-05-30 MED ORDER — FENTANYL CITRATE 0.05 MG/ML IJ SOLN
INTRAMUSCULAR | Status: AC
  Filled 2014-05-30: qty 2

## 2014-05-30 MED ORDER — HEPARIN SODIUM (PORCINE) 5000 UNIT/ML IJ SOLN
5000.0000 [IU] | Freq: Three times a day (TID) | INTRAMUSCULAR | Status: DC
  Administered 2014-05-30 – 2014-06-01 (×6): 5000 [IU] via SUBCUTANEOUS
  Filled 2014-05-30 (×7): qty 1

## 2014-05-30 MED ORDER — SODIUM CHLORIDE 0.9 % IV SOLN
INTRAVENOUS | Status: DC
  Administered 2014-05-30: 10:00:00 via INTRAVENOUS

## 2014-05-30 MED ORDER — SODIUM CHLORIDE 0.9 % IV SOLN: 250.0000 mL | INTRAVENOUS | Status: DC | PRN

## 2014-05-30 MED ORDER — LIDOCAINE HCL (PF) 1 % IJ SOLN
INTRAMUSCULAR | Status: AC
  Filled 2014-05-30: qty 30

## 2014-05-30 MED ORDER — HEPARIN (PORCINE) IN NACL 2-0.9 UNIT/ML-% IJ SOLN
INTRAMUSCULAR | Status: AC
  Filled 2014-05-30: qty 1000

## 2014-05-30 MED ORDER — ONDANSETRON HCL 4 MG/2ML IJ SOLN
4.0000 mg | Freq: Four times a day (QID) | INTRAMUSCULAR | Status: DC | PRN
  Administered 2014-06-03 (×2): 4 mg via INTRAVENOUS
  Filled 2014-05-30 (×3): qty 2

## 2014-05-30 MED ORDER — MAGNESIUM SULFATE 4 GM/100ML IV SOLN
4.0000 g | Freq: Once | INTRAVENOUS | Status: AC
  Administered 2014-05-30: 4 g via INTRAVENOUS
  Filled 2014-05-30: qty 100

## 2014-05-30 MED ORDER — SODIUM CHLORIDE 0.9 % IJ SOLN: 3.0000 mL | INTRAMUSCULAR | Status: DC | PRN

## 2014-05-30 MED ORDER — MILRINONE IN DEXTROSE 20 MG/100ML IV SOLN
0.5000 ug/kg/min | INTRAVENOUS | Status: DC
  Administered 2014-05-30 – 2014-05-31 (×3): 0.25 ug/kg/min via INTRAVENOUS
  Administered 2014-06-02: 0.125 ug/kg/min via INTRAVENOUS
  Administered 2014-06-03 – 2014-06-04 (×2): 0.25 ug/kg/min via INTRAVENOUS
  Administered 2014-06-05 – 2014-06-07 (×6): 0.375 ug/kg/min via INTRAVENOUS
  Administered 2014-06-08: 0.5 ug/kg/min via INTRAVENOUS
  Administered 2014-06-08: 0.375 ug/kg/min via INTRAVENOUS
  Administered 2014-06-09 – 2014-06-16 (×19): 0.5 ug/kg/min via INTRAVENOUS
  Filled 2014-05-30 (×38): qty 100

## 2014-05-30 MED ORDER — ACETAMINOPHEN 325 MG PO TABS
650.0000 mg | ORAL_TABLET | ORAL | Status: DC | PRN
  Administered 2014-06-03 – 2014-06-05 (×4): 650 mg via ORAL
  Filled 2014-05-30 (×4): qty 2

## 2014-05-30 MED ORDER — MIDAZOLAM HCL 2 MG/2ML IJ SOLN
INTRAMUSCULAR | Status: AC
  Filled 2014-05-30: qty 2

## 2014-05-30 NOTE — Progress Notes (Signed)
Spoke at length with wife and sister of patient. Pt sleeping following cath this am. Discussed what the patient does at home regarding his diabetes control. Wife states pt has had diabetes for about 30 years.  They have had much education, and she states pt will sometimes not eat for fear of his glucose running high. She states that his blood sugars stay high even though he eats low carbohydrate and low fat. He takes the amaryl as prescribed bid, and he often has hypoglycemia late at night. In previous note, I recommended other options. Have copied to this note as well: Noted pt's home regimen incudes amaryl 2 mg/day. Pt may do better on a extremely short acting sulfonylurea-the class of meglitinides, Prandin or Starlix given 15 minutes before or at the start of each meal. (Pt would not take if not eating). Starlix (Nateglinide) starting at 60 mg up to 120 mg tidwc may be the better choice as it is listed as no adjustment in dosing needs be made for mild to severe renal insufficiency.  Otherwise, pt could use novolog correction scale and/or meal coverage (3 units tidwc) and a sensitive correction tidwc.  Also noted that metformin total daily dose of 2000 mg/day is part of home regimen. With chronic renal disease and elevated Creatinine levels, metformin is contraindicated for potential for acidosis. There are other options as well , however they are limited due to the renal issues. Explained the benefits of using insulin-levemir (better basal for renal issues) and Novolog meal coverage.  Wife states she will talk with his primary MD, Dr Vernon Preyon Moore about the potential to use other therapies.  Thank you Lenor CoffinAnn Nigel Ericsson, RN, MSN, CDE  Diabetes Inpatient Program Office: [phone] Pager: [phone] 8:00 am to 5:00 pm

## 2014-05-30 NOTE — Progress Notes (Signed)
MD oncall made aware of pt sbp 80's and lasix not being given at 1800. Pts sbp now 100's and ok to give 0200 dose now. MD also made aware that the PICC line has not been inserted and will not be inserted until AM. Will continue to monitor.

## 2014-05-30 NOTE — Progress Notes (Signed)
Paged cardiology with Mag level

## 2014-05-30 NOTE — Progress Notes (Signed)
PHARMACIST - PHYSICIAN ORDER COMMUNICATION  CONCERNING: P&T Medication Policy on Herbal Medications  DESCRIPTION:  This patient's order for:  benfotiamine has been noted.  This product(s) is classified as an "herbal" or natural product. Due to a lack of definitive safety studies or FDA approval, nonstandard manufacturing practices, plus the potential risk of unknown drug-drug interactions while on inpatient medications, the Pharmacy and Therapeutics Committee does not permit the use of "herbal" or natural products of this type within [facility].   ACTION TAKEN: The pharmacy department is unable to verify this order at this time and your patient has been informed of this safety policy. Please reevaluate patient's clinical condition at discharge and address if the herbal or natural product(s) should be resumed at that time. Herby AbrahamMichelle T. Ason Heslin, Pharm.D. [phone] 06/04/2014 6:36 PM

## 2014-05-30 NOTE — Plan of Care (Addendum)
Problem: Food- and Nutrition-Related Knowledge Deficit (NB-1.1) Goal: Nutrition education Formal process to instruct or train a patient/client in a skill or to impart knowledge to help patients/clients voluntarily manage or modify food choices and eating behavior to maintain or improve health.  RD consulted for nutrition education regarding diabetes. Pt was also identified on MST report for nutrition risk (poor appetite and weight loss PTA).     Lab Results  Component Value Date    HGBA1C 8.4% 04/27/2014   Hx obtained by pt and pt wife at bedside. They report pt has had a general decline in health over the past 3 weeks. Pt complained of being weak and having a decreased appetite. However, he now feels like his appetite has returned. He confirms UBW of 171#. Pt feels like most of his weight loss is due to his fluid status.   Pt wife reports that pt was diagnosed with diabetes approximately 20 years ago. She reports pt has had fair control up until about 3 weeks ago and reports frustration with medication adjustments. Per pt, "that Cymbalta they gave me messed everything up". They shared with this RD that pt recently met with a pharmacist/diabetes educator at his PCP office PTA and his insulin regimen was recently adjusted. They also report that diet guidelines were reviewed with them and pt wife was able to describe basic principles of DM diet to this RD. Pt eats a lot of vegetables and consumes low calorie beverages.   RD provided "Carbohydrate Counting for People with Diabetes" handout from the Academy of Nutrition and Dietetics. Discussed different food groups and their effects on blood sugar, emphasizing carbohydrate-containing foods. Provided list of carbohydrates and recommended serving sizes of common foods.  Discussed importance of controlled and consistent carbohydrate intake throughout the day. Provided examples of ways to balance meals/snacks and encouraged intake of high-fiber, whole grain  complex carbohydrates. Teach back method used.  Expect fair compliance.  Body mass index is 24.12 kg/(m^2). Pt meets criteria for normal weight range based on current BMI.  Current diet order is Heart Healthy/ Carb Modified, patient is consuming approximately 85-100% of meals at this time. Labs and medications reviewed. No further nutrition interventions warranted at this time. RD contact information provided. If additional nutrition issues arise, please re-consult RD.  Alicha Raspberry A. Jimmye Norman, RD, LDN, CDE Pager: [phone] After hours Pager: [phone]

## 2014-05-30 NOTE — CV Procedure (Addendum)
    Cardiac Catheterization Procedure Note  Name: Luke Pittman MRN: [id number] DOB: [birth year]  Procedure: Right Heart Cath  Indication: CHF, suspect low output heart failure.    Procedural Details: The right groin was prepped, draped, and anesthetized with 1% lidocaine. Using the modified Seldinger technique a 7 French sheath was placed in the right femoral vein. A Swan-Ganz catheter was used for the right heart catheterization. Standard protocol was followed for recording of right heart pressures and sampling of oxygen saturations. Fick and thermodilution cardiac output was calculated. There were no immediate procedural complications. The patient was transferred to the post catheterization recovery area for further monitoring.  Procedural Findings: Hemodynamics (mmHg) RA mean 22 RV 57/24 PA 61/29, mean 43 PCWP mean 29  Oxygen saturations: PA 34% AO 95%  Cardiac Output (Fick) 3.24  Cardiac Index (Fick) 1.64 PVR 4.3 WU  Cardiac Output (Thermo) 3.23 Cardiac Index (Thermo) 1.63   Final Conclusions:  Elevated right and left heart filling pressures with low cardiac output.  I am going to send the patient to step down and will have PICC placed.  Will start milrinone at 0.25 mcg/kg/min and then increase Lasix to 80 mg IV every 8 hrs.  Will follow CVP and co-ox.  Will need to begin consideration for advanced therapies.   Fluid restrict with hyponatremia, can consider Samsca.   Marca AnconaDalton McLean 06/24/2014, 8:17 AM

## 2014-05-30 NOTE — Interval H&P Note (Signed)
History and Physical Interval Note:  06/10/2014 7:39 AM  Luke Pittman  has presented today for surgery, with the diagnosis of hf  The various methods of treatment have been discussed with the patient and family. After consideration of risks, benefits and other options for treatment, the patient has consented to  Procedure(s): RIGHT HEART CATH (N/A) as a surgical intervention .  The patient's history has been reviewed, patient examined, no change in status, stable for surgery.  I have reviewed the patient's chart and labs.  Questions were answered to the patient's satisfaction.     Ranyah Groeneveld Chesapeake EnergyMcLean

## 2014-05-30 NOTE — Care Management Note (Addendum)
    Page 1 of 2   06/15/2014     11:00:32 AM CARE MANAGEMENT NOTE 06/15/2014  Patient:  Luke Pittman   Account Number:  [id number]  Date Initiated:  05/31/2014  Documentation initiated by:  Junius CreamerWELL,Luke  Subjective/Objective Assessment:   adm w heart failure     Action/Plan:   lives w wife, pcp dr don [name]   Anticipated DC Date:     Anticipated DC Plan:  HOME W HOME HEALTH SERVICES  In-house referral  Hospice / Palliative Care      DC Planning Services  CM consult      [facility]  [facility]  DURABLE MEDICAL EQUIPMENT   Choice offered to / List presented to:  C-1 Patient   DME arranged  IV PUMP/EQUIPMENT  VEST - LIFE VEST      DME agency  [facility]  OTHER - SEE NOTE     HH arranged  HH-1 RN  HH-10 DISEASE MANAGEMENT      HH agency  [facility]   Status of service:   Medicare Important Message given?  YES (If response is "NO", the following Medicare IM given date fields will be blank) Date Medicare IM given:  06/15/2014 Medicare IM given by:  Junius CreamerWELL,Luke Date Additional Medicare IM given:  06/12/2014 Additional Medicare IM given by:  Junius CreamerEBBIE Luke Pittman  [facility]  Per UR Regulation:  Reviewed for med. necessity/level of care/duration of stay  If discussed at Long Length of Stay Meetings, dates discussed:   06/06/2014  06/08/2014  06/13/2014  06/15/2014    Comments:  3/15 1017 Luke Luke Duerson rn,bsn spoke w pt and wife on 3-14 and went over hhc agency list. they chose [facility] for home milrinone. recieved call from Luke Pittman [employer] rep stating he had got call that pt would need [employer]. [employer] form on shadow chart.  ref to Luke t for home milrinone.

## 2014-05-30 NOTE — Progress Notes (Signed)
Site area: right groin a 7 french venous sheath2 was removed  Site Prior to Removal:  Level 0  Pressure Applied For 15 MINUTES    Minutes Beginning at 0840am  Manual:   Yes.    Patient Status During Pull:  stable  Post Pull Groin Site:  Level 0  Post Pull Instructions Given:  Yes.    Post Pull Pulses Present:  Yes.    Dressing Applied:  Yes.    Comments:  VS remain stable during sheath pull.  Pt stated foot pain level started at  10 and now down to level 6

## 2014-05-30 DEATH — deceased

## 2014-05-31 ENCOUNTER — Encounter (HOSPITAL_COMMUNITY): Payer: Self-pay | Admitting: Cardiology

## 2014-05-31 ENCOUNTER — Inpatient Hospital Stay (HOSPITAL_COMMUNITY): Payer: Commercial Managed Care - HMO

## 2014-05-31 DIAGNOSIS — N183 Chronic kidney disease, stage 3 (moderate): Secondary | ICD-10-CM

## 2014-05-31 DIAGNOSIS — I509 Heart failure, unspecified: Secondary | ICD-10-CM

## 2014-05-31 LAB — GLUCOSE, CAPILLARY
Glucose-Capillary: 127 mg/dL — ABNORMAL HIGH (ref 70–99)
Glucose-Capillary: 206 mg/dL — ABNORMAL HIGH (ref 70–99)
Glucose-Capillary: 221 mg/dL — ABNORMAL HIGH (ref 70–99)
Glucose-Capillary: 198 mg/dL — ABNORMAL HIGH (ref 70–99)

## 2014-05-31 LAB — CBC
HEMATOCRIT: 28.4 % — AB (ref 39.0–52.0)
HEMOGLOBIN: 9 g/dL — AB (ref 13.0–17.0)
MCH: 23.2 pg — AB (ref 26.0–34.0)
MCHC: 31.7 g/dL (ref 30.0–36.0)
MCV: 73.2 fL — AB (ref 78.0–100.0)
Platelets: 207 10*3/uL (ref 150–400)
RBC: 3.88 MIL/uL — AB (ref 4.22–5.81)
RDW: 17.4 % — ABNORMAL HIGH (ref 11.5–15.5)
WBC: 6.2 10*3/uL (ref 4.0–10.5)

## 2014-05-31 LAB — COMPREHENSIVE METABOLIC PANEL
ALK PHOS: 105 U/L (ref 39–117)
ALT: 153 U/L — ABNORMAL HIGH (ref 0–53)
ANION GAP: 10 (ref 5–15)
AST: 42 U/L — ABNORMAL HIGH (ref 0–37)
Albumin: 3.1 g/dL — ABNORMAL LOW (ref 3.5–5.2)
BUN: 24 mg/dL — AB (ref 6–23)
CHLORIDE: 92 mmol/L — AB (ref 96–112)
CO2: 27 mmol/L (ref 19–32)
CREATININE: 1.46 mg/dL — AB (ref 0.50–1.35)
Calcium: 8.3 mg/dL — ABNORMAL LOW (ref 8.4–10.5)
GFR calc Af Amer: 55 mL/min — ABNORMAL LOW (ref >90)
GFR calc non Af Amer: 48 mL/min — ABNORMAL LOW (ref >90)
GLUCOSE: 137 mg/dL — AB (ref 70–99)
POTASSIUM: 4.1 mmol/L (ref 3.5–5.1)
Sodium: 129 mmol/L — ABNORMAL LOW (ref 135–145)
Total Bilirubin: 2.2 mg/dL — ABNORMAL HIGH (ref 0.3–1.2)
Total Protein: 5.5 g/dL — ABNORMAL LOW (ref 6.0–8.3)

## 2014-05-31 MED ORDER — PERFLUTREN LIPID MICROSPHERE
INTRAVENOUS | Status: AC
  Administered 2014-05-31: 2 mL via INTRAVENOUS
  Filled 2014-05-31: qty 10

## 2014-05-31 MED ORDER — PERFLUTREN LIPID MICROSPHERE
1.0000 mL | INTRAVENOUS | Status: AC | PRN
  Administered 2014-05-31: 2 mL via INTRAVENOUS
  Filled 2014-05-31: qty 10

## 2014-05-31 MED ORDER — SODIUM CHLORIDE 0.9 % IJ SOLN
10.0000 mL | INTRAMUSCULAR | Status: DC | PRN
  Administered 2014-06-11: 10 mL
  Filled 2014-05-31: qty 40

## 2014-05-31 MED ORDER — SPIRONOLACTONE 12.5 MG HALF TABLET
12.5000 mg | ORAL_TABLET | Freq: Every day | ORAL | Status: DC
  Filled 2014-05-31: qty 1

## 2014-05-31 MED ORDER — NATEGLINIDE 60 MG PO TABS
60.0000 mg | ORAL_TABLET | Freq: Three times a day (TID) | ORAL | Status: DC
Start: 2014-05-31 — End: 2014-06-17
  Administered 2014-05-31 – 2014-06-16 (×49): 60 mg via ORAL
  Filled 2014-05-31 (×57): qty 1

## 2014-05-31 MED ORDER — SODIUM CHLORIDE 0.9 % IJ SOLN
10.0000 mL | Freq: Two times a day (BID) | INTRAMUSCULAR | Status: DC
  Administered 2014-05-31 – 2014-06-03 (×5): 10 mL

## 2014-05-31 MED ORDER — DIGOXIN 125 MCG PO TABS
0.1250 mg | ORAL_TABLET | Freq: Every day | ORAL | Status: DC
  Administered 2014-05-31 – 2014-06-02 (×3): 0.125 mg via ORAL
  Filled 2014-05-31 (×4): qty 1

## 2014-05-31 NOTE — Progress Notes (Signed)
Peripherally Inserted Central Catheter/Midline Placement  The IV Nurse has discussed with the patient and/or persons authorized to consent for the patient, the purpose of this procedure and the potential benefits and risks involved with this procedure.  The benefits include less needle sticks, lab draws from the catheter and patient may be discharged home with the catheter.  Risks include, but not limited to, infection, bleeding, blood clot (thrombus formation), and puncture of an artery; nerve damage and irregular heat beat.  Alternatives to this procedure were also discussed.  PICC/Midline Placement Documentation        [name], [name] Jean 05/31/2014, 3:36 PM

## 2014-05-31 NOTE — Progress Notes (Signed)
  Echocardiogram 2D Echocardiogram has been performed.  Leta JunglingCooper, Blane Worthington M 05/31/2014, 9:54 AM

## 2014-05-31 NOTE — Plan of Care (Signed)
Problem: Consults Goal: General Medical Patient Education See Patient Education Module for specific education.  Outcome: Completed/Met Date Met:  05/31/14 Living Well with Diabetes

## 2014-05-31 NOTE — Progress Notes (Signed)
Patient ID: Luke Pittman, male   DOB: [birth year], 69 y.o.   MRN: [id number]   SUBJECTIVE: Patient feels better this morning and seems more alert.  He is now on milrinone.  He diuresed well yesterday, weight is down 2 lbs.  No PICC line yet.   RHC:  RA mean 22 RV 57/24 PA 61/29, mean 43 PCWP mean 29 Oxygen saturations: PA 34% AO 95% Cardiac Output (Fick) 3.24  Cardiac Index (Fick) 1.64 PVR 4.3 WU Cardiac Output (Thermo) 3.23 Cardiac Index (Thermo) 1.63  Filed Vitals:   05/31/14 0300 05/31/14 0400 05/31/14 0500 05/31/14 0615  BP: 83/48 90/44  84/60  Pulse: 108 102  103  Temp:   97.9 F (36.6 C)   TempSrc:   Oral   Resp: Height:      Weight:   170 lb 9.6 oz (77.384 kg)   SpO2: 97% 97%  93%    Intake/Output Summary (Last 24 hours) at 05/31/14 0727 Last data filed at 05/31/14 0600  Gross per 24 hour  Intake 277.71 ml  Output   3165 ml  Net -2887.29 ml    LABS: Basic Metabolic Panel:  Recent Labs  40/98/11 0013 Jun 24, 2014 1125 05/31/14 0313  NA 128*  --  129*  K 4.4  --  4.1  CL 94*  --  92*  CO2 24  --  27  GLUCOSE 202*  --  137*  BUN 25*  --  24*  CREATININE 1.51* 1.55* 1.46*  CALCIUM 8.6  --  8.3*  MG 1.5  --   --    Liver Function Tests:  Recent Labs  06-24-14 0013 05/31/14 0313  AST 56* 42*  ALT 211* 153*  ALKPHOS 120* 105  BILITOT 2.0* 2.2*  PROT 6.1 5.5*  ALBUMIN 3.5 3.1*   No results for input(s): LIPASE, AMYLASE in the last 72 hours. CBC:  Recent Labs  05/25/2014 1606 06/24/14 1125 05/31/14 0313  WBC 4.7 5.2 6.2  NEUTROABS 3.0  --   --   HGB 9.8* 9.7* 9.0*  HCT 31.1* 30.3* 28.4*  MCV 72.3* 73.2* 73.2*  PLT 222 207 207   Cardiac Enzymes:  Recent Labs  05/08/2014 1606 05/04/2014 1812 06/24/14 0013  TROPONINI 0.10* 0.03 0.03   BNP: Invalid input(s): POCBNP D-Dimer: No results for input(s): DDIMER in the last 72 hours. Hemoglobin A1C: No results for input(s): HGBA1C in the last 72 hours. Fasting Lipid Panel: No  results for input(s): CHOL, HDL, LDLCALC, TRIG, CHOLHDL, LDLDIRECT in the last 72 hours. Thyroid Function Tests: No results for input(s): TSH, T4TOTAL, T3FREE, THYROIDAB in the last 72 hours.  Invalid input(s): FREET3 Anemia Panel: No results for input(s): VITAMINB12, FOLATE, FERRITIN, TIBC, IRON, RETICCTPCT in the last 72 hours.  RADIOLOGY: Dg Chest 2 View  05/02/2014   CLINICAL DATA:  Congestive failure  EXAM: CHEST  2 VIEW  COMPARISON:  10/03/2013  FINDINGS: Cardiac shadow remains enlarged. Postsurgical changes are again seen. No vascular congestion is seen. Very minimal effusions are noted bilaterally. No focal infiltrate is noted. No bony abnormality is seen.  IMPRESSION: Small effusions.  No other significant abnormality is noted.   Electronically Signed   By: Alcide Clever M.D.   On: 05/13/2014 13:44   US Abdomen Complete  05/24/2014   CLINICAL DATA:  Increased liver function test. History of diabetes, chronic kidney disease and patient is taking Cymbalta  EXAM: ULTRASOUND ABDOMEN COMPLETE  COMPARISON:  None.  FINDINGS: Gallbladder: Cholelithiasis without  pericholecystic fluid. Gallbladder wall wall thickening measuring 5.6 mm. Negative sonographic Murphy sign.  Common bile duct: Diameter: 2.5 mm  Liver: No focal lesion identified. Within normal limits in parenchymal echogenicity.  IVC: No abnormality visualized.  Pancreas: Limited visualization secondary to overlying bowel gas.  Spleen: Size and appearance within normal limits.  Right Kidney: Length: 11.5 cm. Echogenicity within normal limits. No mass or hydronephrosis visualized.  Left Kidney: Length: 11.6 cm. Echogenicity within normal limits. No mass or hydronephrosis visualized.  Abdominal aorta: No aneurysm visualized.  Other findings: There is a small amount of ascites. There are bilateral pleural effusions.  IMPRESSION: 1. Cholelithiasis. Gallbladder wall thickening which may be secondary to intrinsic gallbladder wall abnormality suggest  cholecystitis, but can also be seen in the setting of ascites and hepatocellular disease versus hypoalbuminemia versus fluid overload. The lateral etiologies are favored over cholecystitis. 2. Small amount of ascites. 3. Bilateral pleural effusions.   Electronically Signed   By: Elige KoHetal  Patel   On: 05/16/2014 14:56    PHYSICAL EXAM General: NAD Neck: JVP 12-14 cm, no thyromegaly or thyroid nodule.  Lungs: Decreased breath sounds at bases.  CV: Nondisplaced PMI.  Heart mildly tachy, regular S1/S2, no S3/S4, no murmur.  1+ edema to knees bilaterally.   Abdomen: Soft, nontender, no hepatosplenomegaly, mildly distended  Neurologic: Alert and oriented x 3.  Psych: Normal affect. Extremities: No clubbing or cyanosis.   TELEMETRY: Reviewed telemetry pt in sinus tachy with PVCs occasionally  ASSESSMENT AND PLAN: 69 yo with ischemic CMP presented with acute/chronic systolic CHF and low output.  1. Acute on chronic systolic CHF: EF 16-10%20-25% by echo in 5/13. He presented with NYHA class IIIb symptoms (dyspnea and profound fatigue), poor appetite, and markedly elevated transaminases. He is volume overloaded on exam. BP is stable. RHC showed elevated filling pressures and low output.  Milrinone gtt was begun yesterday.  Today, he feels much better.  Less dyspneic, he is also more alert. Creatinine improved today.   - Hold Coreg for now.  - Continue low dose lisinopril.  - Lasix 80 mg IV every 8 hrs, he is still volume overloaded.  - Continue milrinone 0.25 - Add digoxin 0.125 daily in preparation for trying to wean milrinone. .  - He will get PICC today for co-ox and CVP monitoring.  - Awaiting repeat echo.   - He has not wanted ICD in the past. QRS has not been prolonged so not CRT candidate. Will need to repeat ECG and will need to reassess ICD issues eventually.  - RHC will help us decide if he is nearing the need to consider advanced therapies.  2. AKD: Cardiorenal syndrome.  Some  improvement in creatinine with diuresis.  3. CAD: S/p CABG. Cath in 2013 with patent grafts. No chest pain. Continue ASA, will hold statin for now with increased LFTs.  4. Elevated transaminases: ?Hepatic congestion from CHF. He is clearly volume overloaded. LFTs coming down with diuresis, abdominal US showed normal liver echotexture. Of note, HCV positive and HCV RNA sent.  5. Diabetes: Will add nateglinide per diabetes coordinator.  Not good candidate for metformin in the future with CKD.   Marca AnconaDalton Monta Police 05/31/2014 7:37 AM

## 2014-06-01 DIAGNOSIS — I4891 Unspecified atrial fibrillation: Secondary | ICD-10-CM

## 2014-06-01 LAB — COMPREHENSIVE METABOLIC PANEL
ALBUMIN: 3.2 g/dL — AB (ref 3.5–5.2)
ALK PHOS: 114 U/L (ref 39–117)
ALT: 133 U/L — ABNORMAL HIGH (ref 0–53)
ANION GAP: 8 (ref 5–15)
AST: 36 U/L (ref 0–37)
BILIRUBIN TOTAL: 2.6 mg/dL — AB (ref 0.3–1.2)
BUN: 20 mg/dL (ref 6–23)
CHLORIDE: 93 mmol/L — AB (ref 96–112)
CO2: 29 mmol/L (ref 19–32)
CREATININE: 1.38 mg/dL — AB (ref 0.50–1.35)
Calcium: 8.3 mg/dL — ABNORMAL LOW (ref 8.4–10.5)
GFR calc Af Amer: 59 mL/min — ABNORMAL LOW (ref >90)
GFR, EST NON AFRICAN AMERICAN: 51 mL/min — AB (ref >90)
GLUCOSE: 158 mg/dL — AB (ref 70–99)
POTASSIUM: 3.3 mmol/L — AB (ref 3.5–5.1)
Sodium: 130 mmol/L — ABNORMAL LOW (ref 135–145)
Total Protein: 5.7 g/dL — ABNORMAL LOW (ref 6.0–8.3)

## 2014-06-01 LAB — CARBOXYHEMOGLOBIN
CARBOXYHEMOGLOBIN: 1.8 % — AB (ref 0.5–1.5)
Carboxyhemoglobin: 2 % — ABNORMAL HIGH (ref 0.5–1.5)
METHEMOGLOBIN: 0.9 % (ref 0.0–1.5)
Methemoglobin: 0.6 % (ref 0.0–1.5)
O2 SAT: 60.6 %
O2 Saturation: 68.5 %
Total hemoglobin: 9.3 g/dL — ABNORMAL LOW (ref 13.5–18.0)
Total hemoglobin: 8.8 g/dL — ABNORMAL LOW (ref 13.5–18.0)

## 2014-06-01 LAB — CBC
HEMATOCRIT: 29.2 % — AB (ref 39.0–52.0)
Hemoglobin: 9.3 g/dL — ABNORMAL LOW (ref 13.0–17.0)
MCH: 22.7 pg — ABNORMAL LOW (ref 26.0–34.0)
MCHC: 31.8 g/dL (ref 30.0–36.0)
MCV: 71.4 fL — ABNORMAL LOW (ref 78.0–100.0)
Platelets: 215 10*3/uL (ref 150–400)
RBC: 4.09 MIL/uL — ABNORMAL LOW (ref 4.22–5.81)
RDW: 17.3 % — AB (ref 11.5–15.5)
WBC: 6.9 10*3/uL (ref 4.0–10.5)

## 2014-06-01 LAB — BASIC METABOLIC PANEL
Anion gap: 4 — ABNORMAL LOW (ref 5–15)
BUN: 21 mg/dL (ref 6–23)
CHLORIDE: 92 mmol/L — AB (ref 96–112)
CO2: 32 mmol/L (ref 19–32)
CREATININE: 1.36 mg/dL — AB (ref 0.50–1.35)
Calcium: 8.2 mg/dL — ABNORMAL LOW (ref 8.4–10.5)
GFR calc non Af Amer: 52 mL/min — ABNORMAL LOW (ref >90)
GFR, EST AFRICAN AMERICAN: 60 mL/min — AB (ref >90)
Glucose, Bld: 276 mg/dL — ABNORMAL HIGH (ref 70–99)
POTASSIUM: 3.9 mmol/L (ref 3.5–5.1)
SODIUM: 128 mmol/L — AB (ref 135–145)

## 2014-06-01 LAB — GLUCOSE, CAPILLARY
GLUCOSE-CAPILLARY: 152 mg/dL — AB (ref 70–99)
Glucose-Capillary: 127 mg/dL — ABNORMAL HIGH (ref 70–99)
Glucose-Capillary: 294 mg/dL — ABNORMAL HIGH (ref 70–99)
Glucose-Capillary: 273 mg/dL — ABNORMAL HIGH (ref 70–99)

## 2014-06-01 LAB — HEPARIN LEVEL (UNFRACTIONATED): Heparin Unfractionated: 0.57 IU/mL (ref 0.30–0.70)

## 2014-06-01 LAB — HCV RNA QUANT: HCV Quantitative: NOT DETECTED IU/mL — ABNORMAL LOW (ref <15)

## 2014-06-01 LAB — MAGNESIUM: Magnesium: 2 mg/dL (ref 1.5–2.5)

## 2014-06-01 LAB — DIGOXIN LEVEL: Digoxin Level: <0.2 ng/mL — ABNORMAL LOW (ref 0.8–2.0)

## 2014-06-01 MED ORDER — LISINOPRIL 2.5 MG PO TABS
2.5000 mg | ORAL_TABLET | Freq: Two times a day (BID) | ORAL | Status: DC
  Administered 2014-06-01: 2.5 mg via ORAL
  Filled 2014-06-01 (×7): qty 1

## 2014-06-01 MED ORDER — AMIODARONE HCL IN DEXTROSE 360-4.14 MG/200ML-% IV SOLN
30.0000 mg/h | INTRAVENOUS | Status: DC
  Administered 2014-06-01: 30 mg/h via INTRAVENOUS
  Administered 2014-06-01: 60 mg/h via INTRAVENOUS
  Administered 2014-06-01: 30 mg/h via INTRAVENOUS
  Administered 2014-06-01: 59.94 mg/h via INTRAVENOUS
  Administered 2014-06-02 – 2014-06-11 (×17): 30 mg/h via INTRAVENOUS
  Filled 2014-06-01 (×42): qty 200

## 2014-06-01 MED ORDER — AMIODARONE HCL IN DEXTROSE 360-4.14 MG/200ML-% IV SOLN
60.0000 mg/h | INTRAVENOUS | Status: AC
  Administered 2014-06-01: 60 mg/h via INTRAVENOUS
  Filled 2014-06-01 (×2): qty 200

## 2014-06-01 MED ORDER — AMIODARONE LOAD VIA INFUSION
150.0000 mg | Freq: Once | INTRAVENOUS | Status: AC
  Administered 2014-06-01: 150 mg via INTRAVENOUS
  Filled 2014-06-01: qty 83.34

## 2014-06-01 MED ORDER — POTASSIUM CHLORIDE CRYS ER 20 MEQ PO TBCR
40.0000 meq | EXTENDED_RELEASE_TABLET | Freq: Two times a day (BID) | ORAL | Status: DC
  Administered 2014-06-01 – 2014-06-02 (×4): 40 meq via ORAL
  Filled 2014-06-01 (×6): qty 2

## 2014-06-01 MED ORDER — HEPARIN BOLUS VIA INFUSION
4000.0000 [IU] | Freq: Once | INTRAVENOUS | Status: AC
  Administered 2014-06-01: 4000 [IU] via INTRAVENOUS
  Filled 2014-06-01: qty 4000

## 2014-06-01 MED ORDER — FUROSEMIDE 10 MG/ML IJ SOLN
80.0000 mg | Freq: Two times a day (BID) | INTRAMUSCULAR | Status: DC
  Administered 2014-06-01 – 2014-06-02 (×4): 80 mg via INTRAVENOUS
  Filled 2014-06-01 (×5): qty 8

## 2014-06-01 MED ORDER — HEPARIN (PORCINE) IN NACL 100-0.45 UNIT/ML-% IJ SOLN
1250.0000 [IU]/h | INTRAMUSCULAR | Status: DC
  Administered 2014-06-01 – 2014-06-03 (×4): 1050 [IU]/h via INTRAVENOUS
  Administered 2014-06-04: 1100 [IU]/h via INTRAVENOUS
  Administered 2014-06-05 – 2014-06-09 (×5): 1250 [IU]/h via INTRAVENOUS
  Filled 2014-06-01 (×18): qty 250

## 2014-06-01 MED ORDER — MAGNESIUM SULFATE 2 GM/50ML IV SOLN
2.0000 g | Freq: Once | INTRAVENOUS | Status: AC
  Administered 2014-06-01: 2 g via INTRAVENOUS
  Filled 2014-06-01: qty 50

## 2014-06-01 NOTE — Progress Notes (Signed)
Patient ID: Luke Pittman, male   DOB: 20-Sep-1945, 69 y.o.   MRN: [id number]   SUBJECTIVE:   Remains on milrinone. CO-OX stable today 61%. Diuresing with IV lasix. Weight down 7 pounds. CVP 8.   ECHO EF 20% mild LVH  RV mod-severely dilated with moderate-severe systolic dysfunction .   RHC: 06/08/2014.  RA mean 22 RV 57/24 PA 61/29, mean 43 PCWP mean 29 Oxygen saturations: PA 34% AO 95% Cardiac Output (Fick) 3.24  Cardiac Index (Fick) 1.64 PVR 4.3 WU Cardiac Output (Thermo) 3.23 Cardiac Index (Thermo) 1.63  Filed Vitals:   06/01/14 0100 06/01/14 0200 06/01/14 0400 06/01/14 0700  BP: 119/58 111/60 121/76 100/52  Pulse: 106 102 122 110  Temp:   98.2 F (36.8 C) 98.1 F (36.7 C)  TempSrc:   Oral Oral  Resp: Height:      Weight:   163 lb 12.8 oz (74.3 kg)   SpO2: 97% 99% 98% 98%    Intake/Output Summary (Last 24 hours) at 06/01/14 0735 Last data filed at 06/01/14 0700  Gross per 24 hour  Intake  582.6 ml  Output   5175 ml  Net -4592.4 ml    LABS: Basic Metabolic Panel:  Recent Labs  40/98/11 0013  05/31/14 0313 06/01/14 0400  NA 128*  --  129* 130*  K 4.4  --  4.1 3.3*  CL 94*  --  92* 93*  CO2 24  --  27 29  GLUCOSE 202*  --  137* 158*  BUN 25*  --  24* 20  CREATININE 1.51*  < > 1.46* 1.38*  CALCIUM 8.6  --  8.3* 8.3*  MG 1.5  --   --   --   < > = values in this interval not displayed. Liver Function Tests:  Recent Labs  05/31/14 0313 06/01/14 0400  AST 42* 36  ALT 153* 133*  ALKPHOS 105 114  BILITOT 2.2* 2.6*  PROT 5.5* 5.7*  ALBUMIN 3.1* 3.2*   No results for input(s): LIPASE, AMYLASE in the last 72 hours. CBC:  Recent Labs  05/23/2014 1606  05/31/14 0313 06/01/14 0400  WBC 4.7  < > 6.2 6.9  NEUTROABS 3.0  --   --   --   HGB 9.8*  < > 9.0* 9.3*  HCT 31.1*  < > 28.4* 29.2*  MCV 72.3*  < > 73.2* 71.4*  PLT 222  < > 207 215  < > = values in this interval not displayed. Cardiac Enzymes:  Recent Labs  05/21/2014 1606  05/01/2014 1812 06/16/2014 0013  TROPONINI 0.10* 0.03 0.03   BNP: Invalid input(s): POCBNP D-Dimer: No results for input(s): DDIMER in the last 72 hours. Hemoglobin A1C: No results for input(s): HGBA1C in the last 72 hours. Fasting Lipid Panel: No results for input(s): CHOL, HDL, LDLCALC, TRIG, CHOLHDL, LDLDIRECT in the last 72 hours. Thyroid Function Tests: No results for input(s): TSH, T4TOTAL, T3FREE, THYROIDAB in the last 72 hours.  Invalid input(s): FREET3 Anemia Panel: No results for input(s): VITAMINB12, FOLATE, FERRITIN, TIBC, IRON, RETICCTPCT in the last 72 hours.  RADIOLOGY: Dg Chest 2 View  05/20/2014   CLINICAL DATA:  Congestive failure  EXAM: CHEST  2 VIEW  COMPARISON:  10/03/2013  FINDINGS: Cardiac shadow remains enlarged. Postsurgical changes are again seen. No vascular congestion is seen. Very minimal effusions are noted bilaterally. No focal infiltrate is noted. No bony abnormality is seen.  IMPRESSION: Small effusions.  No other significant abnormality  is noted.   Electronically Signed   By: Alcide CleverMark  Lukens M.D.   On: April 28, 2014 13:44   [place] Abdomen Complete  02/19/2015   CLINICAL DATA:  Increased liver function test. History of diabetes, chronic kidney disease and patient is taking Cymbalta  EXAM: ULTRASOUND ABDOMEN COMPLETE  COMPARISON:  None.  FINDINGS: Gallbladder: Cholelithiasis without pericholecystic fluid. Gallbladder wall wall thickening measuring 5.6 mm. Negative sonographic Murphy sign.  Common bile duct: Diameter: 2.5 mm  Liver: No focal lesion identified. Within normal limits in parenchymal echogenicity.  IVC: No abnormality visualized.  Pancreas: Limited visualization secondary to overlying bowel gas.  Spleen: Size and appearance within normal limits.  Right Kidney: Length: 11.5 cm. Echogenicity within normal limits. No mass or hydronephrosis visualized.  Left Kidney: Length: 11.6 cm. Echogenicity within normal limits. No mass or hydronephrosis visualized.  Abdominal  aorta: No aneurysm visualized.  Other findings: There is a small amount of ascites. There are bilateral pleural effusions.  IMPRESSION: 1. Cholelithiasis. Gallbladder wall thickening which may be secondary to intrinsic gallbladder wall abnormality suggest cholecystitis, but can also be seen in the setting of ascites and hepatocellular disease versus hypoalbuminemia versus fluid overload. The lateral etiologies are favored over cholecystitis. 2. Small amount of ascites. 3. Bilateral pleural effusions.   Electronically Signed   By: Elige KoHetal  Patel   On: April 28, 2014 14:56   Dg Chest Port 1 View  05/31/2014   CLINICAL DATA:  PICC line placement  EXAM: PORTABLE CHEST - 1 VIEW  COMPARISON:  April 28, 2014  FINDINGS: Right-sided PICC line with the tip projecting over the cavoatrial junction. No focal consolidation or pneumothorax. Trace left pleural effusion. There is stable cardiomegaly. There is evidence of prior CABG.  Mild osteoarthritis of bilateral glenohumeral joints.  IMPRESSION: Right-sided PICC line with the tip projecting over the cavoatrial junction.   Electronically Signed   By: Elige KoHetal  Patel   On: 05/31/2014 16:43    PHYSICAL EXAM CVP 8  General: NAD Neck: JVP  6-7 cm, no thyromegaly or thyroid nodule.  Lungs: Decreased breath sounds at bases.  CV: Nondisplaced PMI.  Heart mildly tachy, regular S1/S2, no S3/S4, no murmur.  No lower extremity edema.   Abdomen: Soft, nontender, no hepatosplenomegaly, mildly distended  Psych: Normal affect. Extremities: No clubbing or cyanosis.   TELEMETRY: Reviewed telemetry pt in sinus tachy with frequent PVCs  ASSESSMENT AND PLAN: 69 yo with ischemic CMP presented with acute/chronic systolic CHF and low output.  1. Acute on chronic systolic CHF: EF 82-95%20-25% by echo in 5/13. He presented with NYHA class IIIb symptoms (dyspnea and profound fatigue), poor appetite, and markedly elevated transaminases. He is volume overloaded on exam. BP is stable. RHC showed elevated  filling pressures and low output.  Milrinone gtt was begun yesterday.  Today, he feels much better.  Less dyspneic, he is also more alert. Creatinine improved today.   - Hold Coreg for now.  - Continue low dose lisinopril, can increase to bid.  - Cut back lasix to 80 mg IV twice a day. Volume status much improved.  Weight down 10 pounds.  Renal function coming down.  -  CO-OX today 60%. Having frequent PVCs. Cut back milrinone 0.125 mcg and check CO-OX 1300.  -  Continue dig 0.125 mg daily  -  ECHO EF 20% mild LVH  RV mod-severely dilated. Dr Shirlee LatchMcLean to review. If RV poor would not be a candidate for LVAD.  -  He has not wanted ICD in the past. QRS has not  been prolonged so not CRT candidate. -  Consult cardiac rehab.   2. AKD: Cardiorenal syndrome.  Some improvement in creatinine with diuresis.  3. CAD: S/p CABG. Cath in 2013 with patent grafts. No chest pain. Continue ASA, will hold statin for now with increased LFTs.  4. Elevated transaminases: ?Hepatic congestion from CHF. He is clearly volume overloaded. LFTs coming down with diuresis, abdominal US showed normal liver echotexture. Of note, HCV positive and HCV RNA sent.  5. Diabetes: Will add nateglinide per diabetes coordinator.  Not good candidate for metformin in the future with CKD.  6. Frequent PVCs: - Check mag now. Cut back milrinone to 0.125 mcg.   CLEGG,AMY NP-C  06/01/2014 7:35 AM   Patient seen with NP, agree with the above note.  Patient has diuresed well and weight down, CVP down to 8.  He feels better.  However, this morning it appears he has gone into atrial fibrillation overnight.   - Cut milrinone down to 0.125 and continue digoxin.  - Will add amiodarone gtt to try to convert, and will start heparin gtt for now.  - Decrease Lasix to bid today, probably to po tomorrow.  - Replace K and Mg aggressively, needs pm BMET and Mg level.   Marca Ancona 06/01/2014 9:31 AM

## 2014-06-01 NOTE — Progress Notes (Signed)
Attempted to page MD on call, pt in Atrial flutter.  No complaints at this time, pt is having frequent PVC's, BP remians with systolic in 90's.  No further orders at this time.

## 2014-06-01 NOTE — Progress Notes (Signed)
Inpatient Diabetes Program Recommendations  AACE/ADA: New Consensus Statement on Inpatient Glycemic Control (2013)  Target Ranges:  Prepandial:   less than 140 mg/dL      Peak postprandial:   less than 180 mg/dL (1-2 hours)      Critically ill patients:  140 - 180 mg/dL   Results for Luke Pittman, Zayvier F (MRN [id number]) as of 06/01/2014 07:42  Ref. Range 05/31/2014 08:05 05/31/2014 11:37 05/31/2014 15:59 05/31/2014 21:15  Glucose-Capillary Latest Range: 70-99 mg/dL 213127 (H) 086206 (H) 578221 (H) 198 (H)   Diabetes history: DM2 Outpatient Diabetes medications: Amaryl 2 mg QAM, Metformin 500 mg BID Current orders for Inpatient glycemic control: Novolog 0-9 units TID with meals, Starlix 60 mg TID with meals  Inpatient Diabetes Program Recommendations Correction (SSI): Please consider increasing Novolog correction to moderate scale and adding Novolog bedtime correction scale.  Thanks, Orlando PennerMarie Echo Propp, RN, MSN, CCRN, CDE Diabetes Coordinator Inpatient Diabetes Program [phone] (Team Pager) [phone] (AP office) [phone] [facility] office)

## 2014-06-01 NOTE — Progress Notes (Signed)
CARDIAC REHAB PHASE I   PRE:  Rate/Rhythm: 102 afib with PVCs    BP: sitting 93/48    SaO2: 100 RA  MODE:  Ambulation: 700 ft   POST:  Rate/Rhythm: 115 afib with PVCs    BP: sitting 81/61, 87/42 5 min later    SaO2: 100 RA  Pt very eager to walk. Steady, tolerated well pushing one IV pole. HR up to 110s. Denied dizziness or palpitations but BP decreased after walking. To recliner. Pt sts he would like to walk more. Will f/u. 1310-1350  Elissa LovettReeve, Johnasia Liese Round Lake BeachKristan CES, ACSM 06/01/2014 1:48 PM

## 2014-06-01 NOTE — Progress Notes (Addendum)
ANTICOAGULATION CONSULT NOTE - Initial Consult  Pharmacy Consult for Heparin   Indication: atrial fibrillation  Allergies  Allergen Reactions  . Beta Adrenergic Blockers Other (See Comments)    Swollen hands, Per patient currently ([id number](Jul2015) tolerating Coreg.  . Fish Oil Diarrhea  . Januvia [Sitagliptin Phosphate] Other (See Comments)    Leg cramps  . Tetanus-Diphtheria Toxoids Td Swelling    Patient Measurements: Height: 5\' 11"  (180.3 cm) Weight: 163 lb 12.8 oz (74.3 kg) IBW/kg (Calculated) : 75.3   Vital Signs: Temp: 98.1 F (36.7 C) (03/03 0700) Temp Source: Oral (03/03 0700) BP: 100/52 mmHg (03/03 0700) Pulse Rate: 110 (03/03 0700)  Labs:  Recent Labs  2014-12-12 1517  2014-12-12 1606 2014-12-12 1812 06/23/2014 0013 06/13/2014 1125 05/31/14 0313 06/01/14 0400  HGB  --   < > 9.8*  --   --  9.7* 9.0* 9.3*  HCT  --   < > 31.1*  --   --  30.3* 28.4* 29.2*  PLT  --   < > 222  --   --  207 207 215  LABPROT 16.6*  --   --   --  17.1*  --   --   --   INR 1.33  --   --   --  1.38  --   --   --   CREATININE  --   < > 1.41*  --  1.51* 1.55* 1.46* 1.38*  TROPONINI  --   --  0.10* 0.03 0.03  --   --   --   < > = values in this interval not displayed.  Estimated Creatinine Clearance: 53.8 mL/min (by C-G formula based on Cr of 1.38).   Medical History: Past Medical History  Diagnosis Date  . CAD (coronary artery disease)     a. s/p CABG 1994;  b.  Patent grafts 07/2011  . Chronic systolic CHF (congestive heart failure)     a. EF 25% in 2005;  b. Echo 07/2011: EF 20-25%  . Anemia   . Hyperlipidemia   . Hyperkalemia     a. 07/2011 ->acei d/c'd.  . Ischemic cardiomyopathy   . Type II diabetes mellitus   . Stroke 01/05/2013    "possibility; lost hearing in left ear; not sure it wasn't caused by fluid pill"  . 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  He was started on milrinone and IV furosemide with net -7L  uop and improvement in COOX .  He developed Afib overnight and is started on amiodarone 3/3.  Will anticoagulate with heparin.    Goal of Therapy:  Heparin level 0.3-0.7 units/ml Monitor platelets by anticoagulation protocol: Yes   Plan:  Heparin bolus 4000 uts IV x1  Heparin drip 1050 uts/hr Heparin level 6hr after start Daily HL, CBC  Leota SauersLisa Curran Pharm.D. CPP, BCPS Clinical Pharmacist [phone] 06/01/2014 10:48 AM   Addum:  HL 0.57, cont drip at 1050 units/hr and f/u am labs Talbert CageLora Shadiyah Wernli, PharmD

## 2014-06-02 DIAGNOSIS — N179 Acute kidney failure, unspecified: Secondary | ICD-10-CM

## 2014-06-02 LAB — GLUCOSE, CAPILLARY
GLUCOSE-CAPILLARY: 204 mg/dL — AB (ref 70–99)
Glucose-Capillary: 228 mg/dL — ABNORMAL HIGH (ref 70–99)
Glucose-Capillary: 210 mg/dL — ABNORMAL HIGH (ref 70–99)
Glucose-Capillary: 220 mg/dL — ABNORMAL HIGH (ref 70–99)

## 2014-06-02 LAB — COMPREHENSIVE METABOLIC PANEL
ALT: 97 U/L — ABNORMAL HIGH (ref 0–53)
AST: 28 U/L (ref 0–37)
Albumin: 2.9 g/dL — ABNORMAL LOW (ref 3.5–5.2)
Alkaline Phosphatase: 102 U/L (ref 39–117)
Anion gap: 8 (ref 5–15)
BUN: 19 mg/dL (ref 6–23)
CHLORIDE: 93 mmol/L — AB (ref 96–112)
CO2: 28 mmol/L (ref 19–32)
Calcium: 8 mg/dL — ABNORMAL LOW (ref 8.4–10.5)
Creatinine, Ser: 1.38 mg/dL — ABNORMAL HIGH (ref 0.50–1.35)
GFR calc Af Amer: 59 mL/min — ABNORMAL LOW (ref >90)
GFR, EST NON AFRICAN AMERICAN: 51 mL/min — AB (ref >90)
GLUCOSE: 244 mg/dL — AB (ref 70–99)
POTASSIUM: 3.9 mmol/L (ref 3.5–5.1)
Sodium: 129 mmol/L — ABNORMAL LOW (ref 135–145)
Total Bilirubin: 1.6 mg/dL — ABNORMAL HIGH (ref 0.3–1.2)
Total Protein: 5.3 g/dL — ABNORMAL LOW (ref 6.0–8.3)

## 2014-06-02 LAB — CBC
HCT: 28.3 % — ABNORMAL LOW (ref 39.0–52.0)
HEMOGLOBIN: 9 g/dL — AB (ref 13.0–17.0)
MCH: 22.7 pg — ABNORMAL LOW (ref 26.0–34.0)
MCHC: 31.8 g/dL (ref 30.0–36.0)
MCV: 71.5 fL — ABNORMAL LOW (ref 78.0–100.0)
Platelets: 204 10*3/uL (ref 150–400)
RBC: 3.96 MIL/uL — ABNORMAL LOW (ref 4.22–5.81)
RDW: 17.2 % — ABNORMAL HIGH (ref 11.5–15.5)
WBC: 6.1 10*3/uL (ref 4.0–10.5)

## 2014-06-02 LAB — HEPARIN LEVEL (UNFRACTIONATED): HEPARIN UNFRACTIONATED: 0.61 [IU]/mL (ref 0.30–0.70)

## 2014-06-02 LAB — CARBOXYHEMOGLOBIN
CARBOXYHEMOGLOBIN: 1.4 % (ref 0.5–1.5)
Carboxyhemoglobin: 1.5 % (ref 0.5–1.5)
METHEMOGLOBIN: 0.8 % (ref 0.0–1.5)
Methemoglobin: 0.9 % (ref 0.0–1.5)
O2 SAT: 46.2 %
O2 Saturation: 54.9 %
Total hemoglobin: 8.6 g/dL — ABNORMAL LOW (ref 13.5–18.0)
Total hemoglobin: 10 g/dL — ABNORMAL LOW (ref 13.5–18.0)

## 2014-06-02 LAB — MAGNESIUM: Magnesium: 1.8 mg/dL (ref 1.5–2.5)

## 2014-06-02 MED ORDER — WARFARIN - PHARMACIST DOSING INPATIENT
Freq: Every day | Status: DC
  Administered 2014-06-02: 18:00:00

## 2014-06-02 MED ORDER — MAGNESIUM SULFATE 2 GM/50ML IV SOLN
2.0000 g | Freq: Once | INTRAVENOUS | Status: AC
  Administered 2014-06-02: 2 g via INTRAVENOUS
  Filled 2014-06-02: qty 50

## 2014-06-02 MED ORDER — COUMADIN BOOK
Freq: Once | Status: AC
  Administered 2014-06-02: 11:00:00
  Filled 2014-06-02: qty 1

## 2014-06-02 MED ORDER — INSULIN ASPART 100 UNIT/ML ~~LOC~~ SOLN
0.0000 [IU] | Freq: Three times a day (TID) | SUBCUTANEOUS | Status: DC
  Administered 2014-06-02 (×3): 5 [IU] via SUBCUTANEOUS
  Administered 2014-06-03: 3 [IU] via SUBCUTANEOUS
  Administered 2014-06-04: 8 [IU] via SUBCUTANEOUS
  Administered 2014-06-04: 3 [IU] via SUBCUTANEOUS
  Administered 2014-06-05 (×2): 5 [IU] via SUBCUTANEOUS
  Administered 2014-06-06 – 2014-06-07 (×4): 3 [IU] via SUBCUTANEOUS
  Administered 2014-06-07: 100 [IU] via SUBCUTANEOUS
  Administered 2014-06-08: 2 [IU] via SUBCUTANEOUS
  Administered 2014-06-08 – 2014-06-09 (×2): 3 [IU] via SUBCUTANEOUS
  Administered 2014-06-10: 8 [IU] via SUBCUTANEOUS
  Administered 2014-06-10: 5 [IU] via SUBCUTANEOUS
  Administered 2014-06-11: 3 [IU] via SUBCUTANEOUS
  Administered 2014-06-11: 8 [IU] via SUBCUTANEOUS
  Administered 2014-06-12 (×2): 3 [IU] via SUBCUTANEOUS
  Administered 2014-06-12: 2 [IU] via SUBCUTANEOUS
  Administered 2014-06-13: 8 [IU] via SUBCUTANEOUS
  Administered 2014-06-13 – 2014-06-14 (×4): 3 [IU] via SUBCUTANEOUS
  Administered 2014-06-15: 2 [IU] via SUBCUTANEOUS
  Administered 2014-06-15 (×2): 3 [IU] via SUBCUTANEOUS

## 2014-06-02 MED ORDER — INSULIN DETEMIR 100 UNIT/ML ~~LOC~~ SOLN
10.0000 [IU] | Freq: Every day | SUBCUTANEOUS | Status: DC
  Administered 2014-06-02 – 2014-06-15 (×14): 10 [IU] via SUBCUTANEOUS
  Filled 2014-06-02 (×16): qty 0.1

## 2014-06-02 MED ORDER — WARFARIN SODIUM 5 MG PO TABS
5.0000 mg | ORAL_TABLET | Freq: Once | ORAL | Status: AC
  Administered 2014-06-02: 5 mg via ORAL
  Filled 2014-06-02: qty 1

## 2014-06-02 MED ORDER — SPIRONOLACTONE 12.5 MG HALF TABLET
12.5000 mg | ORAL_TABLET | Freq: Every day | ORAL | Status: DC
  Administered 2014-06-02: 12.5 mg via ORAL
  Filled 2014-06-02 (×3): qty 1

## 2014-06-02 MED ORDER — POTASSIUM CHLORIDE CRYS ER 20 MEQ PO TBCR
40.0000 meq | EXTENDED_RELEASE_TABLET | Freq: Once | ORAL | Status: AC
  Administered 2014-06-02: 40 meq via ORAL
  Filled 2014-06-02: qty 2

## 2014-06-02 MED ORDER — INSULIN ASPART 100 UNIT/ML ~~LOC~~ SOLN
0.0000 [IU] | Freq: Every day | SUBCUTANEOUS | Status: DC
  Administered 2014-06-02: 2 [IU] via SUBCUTANEOUS
  Administered 2014-06-09: 3 [IU] via SUBCUTANEOUS

## 2014-06-02 MED ORDER — WARFARIN VIDEO: Freq: Once | Status: DC

## 2014-06-02 NOTE — Progress Notes (Signed)
Inpatient Diabetes Program Recommendations  AACE/ADA: New Consensus Statement on Inpatient Glycemic Control (2013)  Target Ranges:  Prepandial:   less than 140 mg/dL      Peak postprandial:   less than 180 mg/dL (1-2 hours)      Critically ill patients:  140 - 180 mg/dL   Reason for Visit: Hyperglycemia  Results for Luke Pittman, Jamael F (MRN [id number]) as of 06/02/2014 11:24  Ref. Range 06/01/2014 16:20 06/01/2014 21:21 06/02/2014 07:51  Glucose-Capillary Latest Range: 70-99 mg/dL 409294 (H) 811273 (H) 914228 (H)   Blood sugars increasing.  Recommendations: Consider addition of Levemir 10 units QHS.  Will continue to follow. Thank you. Ailene Ardshonda Guyla Bless, RD, LDN, CDE Inpatient Diabetes Coordinator [phone]

## 2014-06-02 NOTE — Progress Notes (Signed)
ANTICOAGULATION CONSULT NOTE - Follow Up Consult  Pharmacy Consult for heparin Indication: atrial fibrillation  Allergies  Allergen Reactions  . Beta Adrenergic Blockers Other (See Comments)    Swollen hands, Per patient currently ([id number](Jul2015) tolerating Coreg.  . Fish Oil Diarrhea  . Januvia [Sitagliptin Phosphate] Other (See Comments)    Leg cramps  . Tetanus-Diphtheria Toxoids Td Swelling    Patient Measurements: Height: 5\' 11"  (180.3 cm) Weight: 164 lb 11.2 oz (74.707 kg) IBW/kg (Calculated) : 75.3  Vital Signs: Temp: 97.8 F (36.6 C) (03/04 0745) Temp Source: Oral (03/04 0745) BP: 89/45 mmHg (03/04 0800) Pulse Rate: 86 (03/04 0800)  Labs:  Recent Labs  05/31/14 0313 06/01/14 0400 06/01/14 1600 06/01/14 1800 06/02/14 0500  HGB 9.0* 9.3*  --   --  9.0*  HCT 28.4* 29.2*  --   --  28.3*  PLT 207 215  --   --  204  HEPARINUNFRC  --   --   --  0.57 0.61  CREATININE 1.46* 1.38* 1.36*  --  1.38*    Estimated Creatinine Clearance: 54.1 mL/min (by C-G formula based on Cr of 1.38).   Medications:  Scheduled:  . aspirin  81 mg Oral Daily  . digoxin  0.125 mg Oral Daily  . ezetimibe  10 mg Oral Daily  . furosemide  80 mg Intravenous BID  . insulin aspart  0-15 Units Subcutaneous TID WC  . insulin aspart  0-5 Units Subcutaneous QHS  . lisinopril  2.5 mg Oral BID  . magnesium sulfate 1 - 4 g bolus IVPB  2 g Intravenous Once  . nateglinide  60 mg Oral TID WC  . NEOMYCIN-POLYMYXIN-HYDROCORTISONE  3 drop Right Ear QID  . potassium chloride  40 mEq Oral BID  . sodium chloride  10-40 mL Intracatheter Q12H  . sodium chloride  3 mL Intravenous Q12H  . sodium chloride  3 mL Intravenous Q12H  . spironolactone  12.5 mg Oral Daily   Infusions:  . sodium chloride Stopped (06/01/14 2000)  . amiodarone 30 mg/hr (06/01/14 2314)  . heparin 1,050 Units/hr (06/02/14 0500)  . milrinone 0.125 mcg/kg/min (06/01/14 2000)    Assessment: 69 yo male with acute systolic HF afib on  milrinone and also with afib on heparin at 1050 units/hr Heparin level is at goal (HL= 0.61). Adding coumadin today (baseline INR= 1.38 on 06/04/2014).  Hg= 9, plt= 204.   Goal of Therapy:  Heparin level 0.3-0.7 units/ml Monitor platelets by anticoagulation protocol: Yes   Plan:  -No heparin changes needed -Coumadin 5mg  po today -Daily PT/INR -Will begin education process  Harland Germanndrew Heily Carlucci, Ilda Bassetharm D 06/02/2014 8:37 AM

## 2014-06-02 NOTE — Progress Notes (Signed)
Saw pt walking independently. Will f/u as able.  Ethelda ChickKristan Moustafa Mossa CES, ACSM 3:00 PM 06/02/2014

## 2014-06-02 NOTE — Progress Notes (Signed)
Patient ID: Luke Pittman, male   DOB: 09/09/1945, 69 y.o.   MRN: [id number]   SUBJECTIVE:   Milrinone turned down to 0.125 today with atrial fibrillation and PVCs, co-ox down to 46% in early am. He remains in atrial fibrillation, HR 90s currently on amiodarone gtt (better control).  He continues to diurese with IV Lasix, creatinine stable (improved), CVP 8.   ECHO EF 20% mild LVH  RV mod-severely dilated with moderate-severe systolic dysfunction .   RHC: 06/28/2014.  RA mean 22 RV 57/24 PA 61/29, mean 43 PCWP mean 29 Oxygen saturations: PA 34% AO 95% Cardiac Output (Fick) 3.24  Cardiac Index (Fick) 1.64 PVR 4.3 WU Cardiac Output (Thermo) 3.23 Cardiac Index (Thermo) 1.63  Filed Vitals:   06/02/14 0200 06/02/14 0300 06/02/14 0313 06/02/14 0400  BP: 81/49 98/67  84/54  Pulse: 91 96  86  Temp:  98.2 F (36.8 C)    TempSrc:  Oral    Resp:  18  23  Height:      Weight:   164 lb 11.2 oz (74.707 kg)   SpO2: 98% 100%  97%    Intake/Output Summary (Last 24 hours) at 06/02/14 0736 Last data filed at 06/02/14 0400  Gross per 24 hour  Intake 1720.33 ml  Output   3025 ml  Net -1304.67 ml    LABS: Basic Metabolic Panel:  Recent Labs  40/98/11 1600 06/02/14 0500  NA 128* 129*  K 3.9 3.9  CL 92* 93*  CO2 32 28  GLUCOSE 276* 244*  BUN 21 19  CREATININE 1.36* 1.38*  CALCIUM 8.2* 8.0*  MG 2.0 1.8   Liver Function Tests:  Recent Labs  06/01/14 0400 06/02/14 0500  AST 36 28  ALT 133* 97*  ALKPHOS 114 102  BILITOT 2.6* 1.6*  PROT 5.7* 5.3*  ALBUMIN 3.2* 2.9*   No results for input(s): LIPASE, AMYLASE in the last 72 hours. CBC:  Recent Labs  06/01/14 0400 06/02/14 0500  WBC 6.9 6.1  HGB 9.3* 9.0*  HCT 29.2* 28.3*  MCV 71.4* 71.5*  PLT 215 204   Cardiac Enzymes: No results for input(s): CKTOTAL, CKMB, CKMBINDEX, TROPONINI in the last 72 hours. BNP: Invalid input(s): POCBNP D-Dimer: No results for input(s): DDIMER in the last 72 hours. Hemoglobin  A1C: No results for input(s): HGBA1C in the last 72 hours. Fasting Lipid Panel: No results for input(s): CHOL, HDL, LDLCALC, TRIG, CHOLHDL, LDLDIRECT in the last 72 hours. Thyroid Function Tests: No results for input(s): TSH, T4TOTAL, T3FREE, THYROIDAB in the last 72 hours.  Invalid input(s): FREET3 Anemia Panel: No results for input(s): VITAMINB12, FOLATE, FERRITIN, TIBC, IRON, RETICCTPCT in the last 72 hours.  RADIOLOGY: Dg Chest 2 View  05-Jun-2014   CLINICAL DATA:  Congestive failure  EXAM: CHEST  2 VIEW  COMPARISON:  10/03/2013  FINDINGS: Cardiac shadow remains enlarged. Postsurgical changes are again seen. No vascular congestion is seen. Very minimal effusions are noted bilaterally. No focal infiltrate is noted. No bony abnormality is seen.  IMPRESSION: Small effusions.  No other significant abnormality is noted.   Electronically Signed   By: Alcide Clever M.D.   On: Jun 05, 2014 13:44   US Abdomen Complete  06/05/14   CLINICAL DATA:  Increased liver function test. History of diabetes, chronic kidney disease and patient is taking Cymbalta  EXAM: ULTRASOUND ABDOMEN COMPLETE  COMPARISON:  None.  FINDINGS: Gallbladder: Cholelithiasis without pericholecystic fluid. Gallbladder wall wall thickening measuring 5.6 mm. Negative sonographic Murphy sign.  Common bile  duct: Diameter: 2.5 mm  Liver: No focal lesion identified. Within normal limits in parenchymal echogenicity.  IVC: No abnormality visualized.  Pancreas: Limited visualization secondary to overlying bowel gas.  Spleen: Size and appearance within normal limits.  Right Kidney: Length: 11.5 cm. Echogenicity within normal limits. No mass or hydronephrosis visualized.  Left Kidney: Length: 11.6 cm. Echogenicity within normal limits. No mass or hydronephrosis visualized.  Abdominal aorta: No aneurysm visualized.  Other findings: There is a small amount of ascites. There are bilateral pleural effusions.  IMPRESSION: 1. Cholelithiasis. Gallbladder wall  thickening which may be secondary to intrinsic gallbladder wall abnormality suggest cholecystitis, but can also be seen in the setting of ascites and hepatocellular disease versus hypoalbuminemia versus fluid overload. The lateral etiologies are favored over cholecystitis. 2. Small amount of ascites. 3. Bilateral pleural effusions.   Electronically Signed   By: Elige Ko   On: 05/26/2014 14:56   Dg Chest Port 1 View  05/31/2014   CLINICAL DATA:  PICC line placement  EXAM: PORTABLE CHEST - 1 VIEW  COMPARISON:  05/03/2014  FINDINGS: Right-sided PICC line with the tip projecting over the cavoatrial junction. No focal consolidation or pneumothorax. Trace left pleural effusion. There is stable cardiomegaly. There is evidence of prior CABG.  Mild osteoarthritis of bilateral glenohumeral joints.  IMPRESSION: Right-sided PICC line with the tip projecting over the cavoatrial junction.   Electronically Signed   By: Elige Ko   On: 05/31/2014 16:43    PHYSICAL EXAM CVP 8  General: NAD Neck: JVP  10 cm, no thyromegaly or thyroid nodule.  Lungs: Decreased breath sounds at bases.  CV: Nondisplaced PMI.  Heart mildly tachy, regular S1/S2, no S3/S4, no murmur.  1+ ankle edema.   Abdomen: Soft, nontender, no hepatosplenomegaly, mildly distended  Psych: Normal affect. Extremities: No clubbing or cyanosis.   TELEMETRY: Reviewed telemetry pt in atrial fibrillation with occasional PVCs   ASSESSMENT AND PLAN: 69 yo with ischemic CMP presented with acute/chronic systolic CHF and low output.  1. Acute on chronic systolic CHF: EF 09% with at least moderate RV dysfunction by echo. He presented with NYHA class IIIb symptoms (dyspnea and profound fatigue), poor appetite, and markedly elevated transaminases. He is volume overloaded on exam. BP is stable. RHC showed elevated filling pressures and low output.  On RHC, RA/PCWP = 0.75.  Milrinone gtt was begun 3/2 and he felt much better/more alert.  Milrinone turned  down to 0.125 on 3/3, still feels well this morning but co-ox down to 46%.  Creatinine overall better and continues to diurese well with IV Lasix.   - Hold Coreg for now with low output.  - Continue low dose current lisinopril, can add spironolactone 12.5 daily.  - Continue Lasix 80 mg IV bid today, mild residual volume overload.   -  Resend co-ox now, co-ox looked better on milrinone 0.125 yesterday afternoon than it does now.   -  Continue dig 0.125 mg daily  -  He has not wanted ICD in the past, will re-address. QRS has not been prolonged so not CRT candidate. -  Consult cardiac rehab.   -  Will have LVAD coordinator meet him today.  He will be a difficult candidate for LVAD due to RV dysfunction, but he will also be a difficult candidate for home inotropes given arrhythmias.  2. AKI: Cardiorenal syndrome.  Some improvement in creatinine with diuresis.  3. CAD: S/p CABG. Cath in 2013 with patent grafts. No chest pain. Continue ASA,  will hold statin for now with increased LFTs.  4. Elevated transaminases: Suspect hepatic congestion from CHF. He is clearly volume overloaded. LFTs coming down with diuresis, abdominal US showed normal liver echotexture. 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   5. Diabetes: added nateglinide per diabetes coordinator.  Not good candidate for metformin in the future with CKD.  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  7. Atrial fibrillation: Appears to have gone into atrial fibrillation this admission on milrinone.  He is now on heparin gtt and amiodarone gtt, HR 90s.  I will add warfarin today.  I will continue to load amiodarone and attempt cardioversion perhaps Monday => he is going to be difficult to convert if he is not able to come off milrinone.   Marca AnconaDalton Bogdan Vivona   06/02/2014 7:36 AM

## 2014-06-02 NOTE — Progress Notes (Addendum)
Mechanical Circulatory Support Note  Luke Pittman is a 69 y.o. 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 Currently being considered for potential evaluation for advanced therapies which include Left Ventricular Assist Device implantation.   Have had lengthy discussion with patient and his wife Luke Pittman about the risks, benefits, indications and alternatives to Heartmate II VAD placement. I have reviewed in full the following: caregiver role, complications, life style changes including long-term anticoagulation, follow up care, discharge planning, rehabilitation, VAD dressing supplies and driveline management care. The patient was provided with educational resources to take home and read more about the device offered. He and his wife understand that from this discussion it does not mean that they will receive the device nor is he being formally evaluated at this current time. Discussed the process of consideration as potential LVAD candidate and the extensive evaluation process that is involved.  His primary concern at this time will be the insurance coverage for the device and long-term maintenance. Will forward his information for financial consideration as requested by [place].    They understand that ongoing discussion with Dr. Shirlee LatchMcLean and the medical team will need to occur, however this device may be a considerable option for him if other medical therapies fail.   I have provided the patient with my contact information in order for them to call back if they have any questions. I informed them that I would be back by on Monday to talk with them again and hear their thoughts and answer any questions they may have after discussion amongst themselves over the weekend.   Rexene AlbertsStephanie Dixon,  RN VAD Coordinator   Office: [phone] 24/7 VAD Pager: [phone]

## 2014-06-02 NOTE — Progress Notes (Signed)
Patient up walking in hallway with assistance tolerated well. Up in stair

## 2014-06-03 LAB — PROTIME-INR
INR: 1.28 (ref 0.00–1.49)
Prothrombin Time: 16.1 seconds — ABNORMAL HIGH (ref 11.6–15.2)

## 2014-06-03 LAB — BASIC METABOLIC PANEL
ANION GAP: 14 (ref 5–15)
BUN: 33 mg/dL — ABNORMAL HIGH (ref 6–23)
CALCIUM: 8.3 mg/dL — AB (ref 8.4–10.5)
CHLORIDE: 89 mmol/L — AB (ref 96–112)
CO2: 24 mmol/L (ref 19–32)
Creatinine, Ser: 1.99 mg/dL — ABNORMAL HIGH (ref 0.50–1.35)
GFR calc non Af Amer: 33 mL/min — ABNORMAL LOW (ref >90)
GFR, EST AFRICAN AMERICAN: 38 mL/min — AB (ref >90)
Glucose, Bld: 270 mg/dL — ABNORMAL HIGH (ref 70–99)
POTASSIUM: 5 mmol/L (ref 3.5–5.1)
SODIUM: 127 mmol/L — AB (ref 135–145)

## 2014-06-03 LAB — GLUCOSE, CAPILLARY
GLUCOSE-CAPILLARY: 191 mg/dL — AB (ref 70–99)
Glucose-Capillary: 97 mg/dL (ref 70–99)
Glucose-Capillary: 113 mg/dL — ABNORMAL HIGH (ref 70–99)
Glucose-Capillary: 195 mg/dL — ABNORMAL HIGH (ref 70–99)

## 2014-06-03 LAB — COMPREHENSIVE METABOLIC PANEL
ALBUMIN: 3.2 g/dL — AB (ref 3.5–5.2)
ALT: 89 U/L — ABNORMAL HIGH (ref 0–53)
ALT: 91 U/L — ABNORMAL HIGH (ref 0–53)
ANION GAP: 7 (ref 5–15)
AST: 35 U/L (ref 0–37)
AST: 34 U/L (ref 0–37)
Albumin: 3.3 g/dL — ABNORMAL LOW (ref 3.5–5.2)
Alkaline Phosphatase: 107 U/L (ref 39–117)
Alkaline Phosphatase: 107 U/L (ref 39–117)
Anion gap: 12 (ref 5–15)
BUN: 29 mg/dL — ABNORMAL HIGH (ref 6–23)
BUN: 31 mg/dL — AB (ref 6–23)
CALCIUM: 8.5 mg/dL (ref 8.4–10.5)
CALCIUM: 8.4 mg/dL (ref 8.4–10.5)
CO2: 24 mmol/L (ref 19–32)
CO2: 27 mmol/L (ref 19–32)
Chloride: 89 mmol/L — ABNORMAL LOW (ref 96–112)
Chloride: 91 mmol/L — ABNORMAL LOW (ref 96–112)
Creatinine, Ser: 1.78 mg/dL — ABNORMAL HIGH (ref 0.50–1.35)
Creatinine, Ser: 1.88 mg/dL — ABNORMAL HIGH (ref 0.50–1.35)
GFR calc non Af Amer: 35 mL/min — ABNORMAL LOW (ref >90)
GFR, EST AFRICAN AMERICAN: 43 mL/min — AB (ref >90)
GFR, EST AFRICAN AMERICAN: 41 mL/min — AB (ref >90)
GFR, EST NON AFRICAN AMERICAN: 37 mL/min — AB (ref >90)
Glucose, Bld: 171 mg/dL — ABNORMAL HIGH (ref 70–99)
Glucose, Bld: 156 mg/dL — ABNORMAL HIGH (ref 70–99)
POTASSIUM: 6 mmol/L — AB (ref 3.5–5.1)
Potassium: 6.3 mmol/L (ref 3.5–5.1)
Sodium: 125 mmol/L — ABNORMAL LOW (ref 135–145)
Sodium: 125 mmol/L — ABNORMAL LOW (ref 135–145)
TOTAL PROTEIN: 6 g/dL (ref 6.0–8.3)
Total Bilirubin: 1.5 mg/dL — ABNORMAL HIGH (ref 0.3–1.2)
Total Bilirubin: 1.5 mg/dL — ABNORMAL HIGH (ref 0.3–1.2)
Total Protein: 5.9 g/dL — ABNORMAL LOW (ref 6.0–8.3)

## 2014-06-03 LAB — CBC
HEMATOCRIT: 29.1 % — AB (ref 39.0–52.0)
HEMOGLOBIN: 9.5 g/dL — AB (ref 13.0–17.0)
MCH: 23.2 pg — ABNORMAL LOW (ref 26.0–34.0)
MCHC: 32.6 g/dL (ref 30.0–36.0)
MCV: 71.1 fL — ABNORMAL LOW (ref 78.0–100.0)
Platelets: 205 10*3/uL (ref 150–400)
RBC: 4.09 MIL/uL — AB (ref 4.22–5.81)
RDW: 17.2 % — ABNORMAL HIGH (ref 11.5–15.5)
WBC: 7.1 10*3/uL (ref 4.0–10.5)

## 2014-06-03 LAB — CARBOXYHEMOGLOBIN
CARBOXYHEMOGLOBIN: 1.1 % (ref 0.5–1.5)
CARBOXYHEMOGLOBIN: 1.2 % (ref 0.5–1.5)
Methemoglobin: 0.7 % (ref 0.0–1.5)
Methemoglobin: 0.8 % (ref 0.0–1.5)
O2 SAT: 46.8 %
O2 SAT: 53.4 %
TOTAL HEMOGLOBIN: 9.6 g/dL — AB (ref 13.5–18.0)
Total hemoglobin: 9.5 g/dL — ABNORMAL LOW (ref 13.5–18.0)

## 2014-06-03 LAB — MAGNESIUM: MAGNESIUM: 2.2 mg/dL (ref 1.5–2.5)

## 2014-06-03 LAB — HEPARIN LEVEL (UNFRACTIONATED): Heparin Unfractionated: 0.38 IU/mL (ref 0.30–0.70)

## 2014-06-03 MED ORDER — FUROSEMIDE 10 MG/ML IJ SOLN
20.0000 mg/h | INTRAVENOUS | Status: DC
  Administered 2014-06-03 – 2014-06-04 (×2): 12 mg/h via INTRAVENOUS
  Administered 2014-06-05 – 2014-06-08 (×4): 15 mg/h via INTRAVENOUS
  Administered 2014-06-09 – 2014-06-10 (×3): 20 mg/h via INTRAVENOUS
  Filled 2014-06-03 (×22): qty 25

## 2014-06-03 MED ORDER — HYDROCODONE-ACETAMINOPHEN 5-325 MG PO TABS
1.0000 | ORAL_TABLET | Freq: Four times a day (QID) | ORAL | Status: DC | PRN
  Administered 2014-06-03 – 2014-06-05 (×4): 1 via ORAL
  Administered 2014-06-07 (×2): 2 via ORAL
  Administered 2014-06-08 (×2): 1 via ORAL
  Administered 2014-06-08: 2 via ORAL
  Administered 2014-06-10 – 2014-06-14 (×7): 1 via ORAL
  Administered 2014-06-15 – 2014-06-16 (×2): 2 via ORAL
  Filled 2014-06-03 (×2): qty 1
  Filled 2014-06-03: qty 2
  Filled 2014-06-03: qty 1
  Filled 2014-06-03: qty 2
  Filled 2014-06-03: qty 1
  Filled 2014-06-03 (×2): qty 2
  Filled 2014-06-03 (×4): qty 1
  Filled 2014-06-03: qty 2
  Filled 2014-06-03: qty 1
  Filled 2014-06-03: qty 2
  Filled 2014-06-03 (×4): qty 1

## 2014-06-03 MED ORDER — ONDANSETRON HCL 4 MG/2ML IJ SOLN
4.0000 mg | Freq: Once | INTRAMUSCULAR | Status: AC
  Administered 2014-06-03: 4 mg via INTRAVENOUS

## 2014-06-03 MED ORDER — SODIUM POLYSTYRENE SULFONATE 15 GM/60ML PO SUSP
45.0000 g | Freq: Once | ORAL | Status: AC
  Administered 2014-06-03: 45 g via ORAL
  Filled 2014-06-03: qty 180

## 2014-06-03 MED ORDER — SILVER SULFADIAZINE 1 % EX CREA
TOPICAL_CREAM | Freq: Two times a day (BID) | CUTANEOUS | Status: DC
  Administered 2014-06-03: 1 via TOPICAL
  Administered 2014-06-03 – 2014-06-08 (×9): via TOPICAL
  Administered 2014-06-09: 1 via TOPICAL
  Administered 2014-06-09 – 2014-06-11 (×3): via TOPICAL
  Administered 2014-06-11: 1 via TOPICAL
  Administered 2014-06-12: 22:00:00 via TOPICAL
  Administered 2014-06-12: 1 via TOPICAL
  Administered 2014-06-13 – 2014-06-14 (×4): via TOPICAL
  Administered 2014-06-15: 1 via TOPICAL
  Administered 2014-06-15 – 2014-06-16 (×2): via TOPICAL
  Administered 2014-06-16: 1 via TOPICAL
  Filled 2014-06-03: qty 85

## 2014-06-03 MED ORDER — PRAVASTATIN SODIUM 80 MG PO TABS
80.0000 mg | ORAL_TABLET | Freq: Every day | ORAL | Status: DC
Start: 2014-06-03 — End: 2014-06-17
  Administered 2014-06-03 – 2014-06-15 (×13): 80 mg via ORAL
  Filled 2014-06-03 (×15): qty 1

## 2014-06-03 MED ORDER — WARFARIN SODIUM 5 MG PO TABS
5.0000 mg | ORAL_TABLET | Freq: Once | ORAL | Status: AC
  Administered 2014-06-03: 5 mg via ORAL
  Filled 2014-06-03: qty 1

## 2014-06-03 MED ORDER — FUROSEMIDE 10 MG/ML IJ SOLN: 40.0000 mg | Freq: Once | INTRAMUSCULAR | Status: DC

## 2014-06-03 NOTE — Progress Notes (Signed)
Patient ID: Luke Pittman, male   DOB: [birth year], 69 y.o.   MRN: [id number]   SUBJECTIVE:   Patient continues to feel well but CVP 22 today and co-ox 47% on milrinone 0.25.  He remains in atrial fibrillation but HR around 90 on amiodarone gtt.  Creatinine up to 1.88 with hyperkalemia today.   ECHO EF 20% mild LVH  RV mod-severely dilated with moderate-severe systolic dysfunction .   RHC: 06/21/2014.  RA mean 22 RV 57/24 PA 61/29, mean 43 PCWP mean 29 Oxygen saturations: PA 34% AO 95% Cardiac Output (Fick) 3.24  Cardiac Index (Fick) 1.64 PVR 4.3 WU Cardiac Output (Thermo) 3.23 Cardiac Index (Thermo) 1.63  Filed Vitals:   06/03/14 0400 06/03/14 0500 06/03/14 0600 06/03/14 0700  BP: 83/46 82/45 111/49 92/57  Pulse: 42     Temp:    97.5 F (36.4 C)  TempSrc:    Oral  Resp:  Height:      Weight:      SpO2: 85%   99%    Intake/Output Summary (Last 24 hours) at 06/03/14 1010 Last data filed at 06/03/14 0700  Gross per 24 hour  Intake  544.7 ml  Output    925 ml  Net -380.3 ml    LABS: Basic Metabolic Panel:  Recent Labs  40/98/11 0500 06/03/14 0640 06/03/14 0816  NA 129* 125* 125*  K 3.9 6.0* 6.3*  CL 93* 89* 91*  CO2 GLUCOSE 244* 171* 156*  BUN 19 29* 31*  CREATININE 1.38* 1.78* 1.88*  CALCIUM 8.0* 8.5 8.4  MG 1.8 2.2  --    Liver Function Tests:  Recent Labs  06/03/14 0640 06/03/14 0816  AST 35 34  ALT 89* 91*  ALKPHOS 107 107  BILITOT 1.5* 1.5*  PROT 5.9* 6.0  ALBUMIN 3.2* 3.3*   No results for input(s): LIPASE, AMYLASE in the last 72 hours. CBC:  Recent Labs  06/02/14 0500 06/03/14 0640  WBC 6.1 7.1  HGB 9.0* 9.5*  HCT 28.3* 29.1*  MCV 71.5* 71.1*  PLT 204 205   Cardiac Enzymes: No results for input(s): CKTOTAL, CKMB, CKMBINDEX, TROPONINI in the last 72 hours. BNP: Invalid input(s): POCBNP D-Dimer: No results for input(s): DDIMER in the last 72 hours. Hemoglobin A1C: No results for input(s): HGBA1C in the  last 72 hours. Fasting Lipid Panel: No results for input(s): CHOL, HDL, LDLCALC, TRIG, CHOLHDL, LDLDIRECT in the last 72 hours. Thyroid Function Tests: No results for input(s): TSH, T4TOTAL, T3FREE, THYROIDAB in the last 72 hours.  Invalid input(s): FREET3 Anemia Panel: No results for input(s): VITAMINB12, FOLATE, FERRITIN, TIBC, IRON, RETICCTPCT in the last 72 hours.  RADIOLOGY: Dg Chest 2 View  05/01/2014   CLINICAL DATA:  Congestive failure  EXAM: CHEST  2 VIEW  COMPARISON:  10/03/2013  FINDINGS: Cardiac shadow remains enlarged. Postsurgical changes are again seen. No vascular congestion is seen. Very minimal effusions are noted bilaterally. No focal infiltrate is noted. No bony abnormality is seen.  IMPRESSION: Small effusions.  No other significant abnormality is noted.   Electronically Signed   By: Alcide Clever M.D.   On: 05/03/2014 13:44   US Abdomen Complete  05/13/2014   CLINICAL DATA:  Increased liver function test. History of diabetes, chronic kidney disease and patient is taking Cymbalta  EXAM: ULTRASOUND ABDOMEN COMPLETE  COMPARISON:  None.  FINDINGS: Gallbladder: Cholelithiasis without pericholecystic fluid. Gallbladder wall wall thickening measuring 5.6 mm. Negative sonographic Murphy sign.  Common bile duct: Diameter: 2.5 mm  Liver: No focal lesion identified. Within normal limits in parenchymal echogenicity.  IVC: No abnormality visualized.  Pancreas: Limited visualization secondary to overlying bowel gas.  Spleen: Size and appearance within normal limits.  Right Kidney: Length: 11.5 cm. Echogenicity within normal limits. No mass or hydronephrosis visualized.  Left Kidney: Length: 11.6 cm. Echogenicity within normal limits. No mass or hydronephrosis visualized.  Abdominal aorta: No aneurysm visualized.  Other findings: There is a small amount of ascites. There are bilateral pleural effusions.  IMPRESSION: 1. Cholelithiasis. Gallbladder wall thickening which may be secondary to  intrinsic gallbladder wall abnormality suggest cholecystitis, but can also be seen in the setting of ascites and hepatocellular disease versus hypoalbuminemia versus fluid overload. The lateral etiologies are favored over cholecystitis. 2. Small amount of ascites. 3. Bilateral pleural effusions.   Electronically Signed   By: Elige KoHetal  Patel   On: 05/19/2014 14:56   Dg Chest Port 1 View  05/31/2014   CLINICAL DATA:  PICC line placement  EXAM: PORTABLE CHEST - 1 VIEW  COMPARISON:  05/16/2014  FINDINGS: Right-sided PICC line with the tip projecting over the cavoatrial junction. No focal consolidation or pneumothorax. Trace left pleural effusion. There is stable cardiomegaly. There is evidence of prior CABG.  Mild osteoarthritis of bilateral glenohumeral joints.  IMPRESSION: Right-sided PICC line with the tip projecting over the cavoatrial junction.   Electronically Signed   By: Elige KoHetal  Patel   On: 05/31/2014 16:43    PHYSICAL EXAM CVP 22  General: NAD Neck: JVP 16 cm, no thyromegaly or thyroid nodule.  Lungs: Decreased breath sounds at bases.  CV: Nondisplaced PMI.  Heart mildly tachy, regular S1/S2, no S3/S4, no murmur.  1+ ankle edema.   Abdomen: Soft, nontender, no hepatosplenomegaly, mildly distended  Psych: Normal affect. Extremities: No clubbing or cyanosis.   TELEMETRY: Reviewed telemetry pt in atrial fibrillation in 90s   ASSESSMENT AND PLAN: 69 yo with ischemic CMP presented with acute/chronic systolic CHF and low output.  1. Acute on chronic systolic CHF: EF 40%20% with at least moderate RV dysfunction by echo. He does not have an ICD (has not wanted in past).  He presented with NYHA class IIIb symptoms (dyspnea and profound fatigue), poor appetite, and markedly elevated transaminases. He is volume overloaded on exam. BP is stable. RHC showed elevated filling pressures and low output.  On RHC, RA/PCWP = 0.75.  Milrinone gtt was begun 3/2 and he felt much better/more alert.  Milrinone turned  down to 0.125 on 3/3, still feels well this morning but co-ox down to 47% and CVP up to 22.  Creatinine now rising.   - Hold Coreg for now with low output.  - Stop spironolactone and lisinopril with hyperkalemia today.  Will also hold digoxin.  - CVP very high but co-ox lower and creatinine up.  I will increase milrinone to 0.25 mcg/kg/min and start patient on Lasix infusion at 12 mg/hr. Repeat BMET/co-ox in pm.   -  He has seen LVAD coordinator.  He will be a difficult candidate for LVAD due to RV dysfunction, but he will also be a difficult candidate for home inotropes given arrhythmias. Will need to discuss further with team on Monday.  2. AKI: Cardiorenal syndrome.  Some improvement in creatinine initially with diuresis but creatinine back up with addition of low dose lisinopril and lowering milrinone.  Will stop lisinopril and increase milrinone back to 0.25.   3. CAD: S/p CABG. Cath in 2013 with  patent grafts. No chest pain. Continue ASA, LFTs back down so can restart statin.   4. Elevated transaminases: Suspect hepatic congestion from CHF. He is clearly volume overloaded. LFTs coming down with diuresis, [place] showed normal liver echotexture. 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   5. Diabetes: added nateglinide per diabetes coordinator.  Not good candidate for metformin in the future with CKD.  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  7. Atrial fibrillation: Appears to have gone into atrial fibrillation this admission on milrinone.  He is on heparin gtt and amiodarone gtt, HR 90.  Now on warfarin.  Will need to decide on DCCV => not sure he will maintain NSR if we are unable to wean off milrinone.   40 minutes critical care time.   Marca Ancona   06/03/2014 10:10 AM

## 2014-06-03 NOTE — Progress Notes (Signed)
ANTICOAGULATION CONSULT NOTE - Follow Up Consult  Pharmacy Consult for Heparin and Warfarin Indication: atrial fibrillation  Allergies  Allergen Reactions  . Beta Adrenergic Blockers Other (See Comments)    Swollen hands, Per patient currently ([id number]) tolerating Coreg.  . Fish Oil Diarrhea  . Januvia [Sitagliptin Phosphate] Other (See Comments)    Leg cramps  . Tetanus-Diphtheria Toxoids Td Swelling    Patient Measurements: Height: 5\' 11"  (180.3 cm) Weight: 171 lb 11.8 oz (77.9 kg) IBW/kg (Calculated) : 75.3  Vital Signs: Temp: 97.5 F (36.4 C) (03/05 0700) Temp Source: Oral (03/05 0700) BP: 92/57 mmHg (03/05 0700) Pulse Rate: 42 (03/05 0400)  Labs:  Recent Labs  06/01/14 0400  06/01/14 1800 06/02/14 0500 06/03/14 0638 06/03/14 0640 06/03/14 0816  HGB 9.3*  --   --  9.0*  --  9.5*  --   HCT 29.2*  --   --  28.3*  --  29.1*  --   PLT 215  --   --  204  --  205  --   LABPROT  --   --   --   --  16.1*  --   --   INR  --   --   --   --  1.28  --   --   HEPARINUNFRC  --   --  0.57 0.61 0.38  --   --   CREATININE 1.38*  < >  --  1.38*  --  1.78* 1.88*  < > = values in this interval not displayed.  Estimated Creatinine Clearance: 40.1 mL/min (by C-G formula based on Cr of 1.88).   Assessment: 69 yo male with acute systolic HF on milrinone and also with afib on heparin at 1050 units/hr Heparin level is at goal (HL= 0.38), but has trended down from 3/4 (HL = 0.61). Patient began warfarin on 3/4 and received 5 mg.  INR today is 1.28. Hg= 9.5, plt= 205. Will increase heparin drip by 50 units/hr as anticipate that his HL will continue to trend down.  Of note, patient is on amiodarone drip.  Pharmacy will monitor closely as this agent has the potential to increase warfarin sensitivity.  Goal of Therapy:  INR 2-3 Heparin level 0.3-0.7 units/ml Monitor platelets by anticoagulation protocol: Yes   Plan:  - Increase heparin gtt to 1100 units/hr - Coumadin 5mg  x 1  tonight - Daily HL, INR, CBC, s/sx bleeding  Russ HaloAshley Jaquetta Currier, PharmD Clinical Pharmacist - Resident Pager: [phone] 3/5/201610:47 AM

## 2014-06-04 ENCOUNTER — Inpatient Hospital Stay (HOSPITAL_COMMUNITY): Payer: Commercial Managed Care - HMO

## 2014-06-04 DIAGNOSIS — I255 Ischemic cardiomyopathy: Secondary | ICD-10-CM

## 2014-06-04 DIAGNOSIS — I2589 Other forms of chronic ischemic heart disease: Secondary | ICD-10-CM

## 2014-06-04 LAB — COMPREHENSIVE METABOLIC PANEL
ALT: 79 U/L — AB (ref 0–53)
ANION GAP: 6 (ref 5–15)
AST: 32 U/L (ref 0–37)
Albumin: 3.2 g/dL — ABNORMAL LOW (ref 3.5–5.2)
Alkaline Phosphatase: 110 U/L (ref 39–117)
BILIRUBIN TOTAL: 1.3 mg/dL — AB (ref 0.3–1.2)
BUN: 36 mg/dL — ABNORMAL HIGH (ref 6–23)
CO2: 28 mmol/L (ref 19–32)
CREATININE: 2.17 mg/dL — AB (ref 0.50–1.35)
Calcium: 8.3 mg/dL — ABNORMAL LOW (ref 8.4–10.5)
Chloride: 91 mmol/L — ABNORMAL LOW (ref 96–112)
GFR calc Af Amer: 34 mL/min — ABNORMAL LOW (ref >90)
GFR calc non Af Amer: 30 mL/min — ABNORMAL LOW (ref >90)
GLUCOSE: 120 mg/dL — AB (ref 70–99)
Potassium: 4.4 mmol/L (ref 3.5–5.1)
Sodium: 125 mmol/L — ABNORMAL LOW (ref 135–145)
TOTAL PROTEIN: 5.8 g/dL — AB (ref 6.0–8.3)

## 2014-06-04 LAB — GLUCOSE, CAPILLARY
GLUCOSE-CAPILLARY: 157 mg/dL — AB (ref 70–99)
GLUCOSE-CAPILLARY: 188 mg/dL — AB (ref 70–99)
Glucose-Capillary: 79 mg/dL (ref 70–99)

## 2014-06-04 LAB — CBC
HEMATOCRIT: 27.5 % — AB (ref 39.0–52.0)
HEMOGLOBIN: 8.8 g/dL — AB (ref 13.0–17.0)
MCH: 22.9 pg — ABNORMAL LOW (ref 26.0–34.0)
MCHC: 32 g/dL (ref 30.0–36.0)
MCV: 71.4 fL — AB (ref 78.0–100.0)
PLATELETS: 165 10*3/uL (ref 150–400)
RBC: 3.85 MIL/uL — ABNORMAL LOW (ref 4.22–5.81)
RDW: 17.4 % — ABNORMAL HIGH (ref 11.5–15.5)
WBC: 5.9 10*3/uL (ref 4.0–10.5)

## 2014-06-04 LAB — PROTIME-INR
INR: 1.37 (ref 0.00–1.49)
Prothrombin Time: 17 seconds — ABNORMAL HIGH (ref 11.6–15.2)

## 2014-06-04 LAB — CARBOXYHEMOGLOBIN
CARBOXYHEMOGLOBIN: 1.4 % (ref 0.5–1.5)
Carboxyhemoglobin: 1.6 % — ABNORMAL HIGH (ref 0.5–1.5)
METHEMOGLOBIN: 0.8 % (ref 0.0–1.5)
Methemoglobin: 0.5 % (ref 0.0–1.5)
O2 SAT: 82.9 %
O2 Saturation: 61.2 %
TOTAL HEMOGLOBIN: 8.9 g/dL — AB (ref 13.5–18.0)
Total hemoglobin: 8.9 g/dL — ABNORMAL LOW (ref 13.5–18.0)

## 2014-06-04 LAB — HEPARIN LEVEL (UNFRACTIONATED): HEPARIN UNFRACTIONATED: 0.52 [IU]/mL (ref 0.30–0.70)

## 2014-06-04 LAB — SURGICAL PCR SCREEN
MRSA, PCR: NEGATIVE
Staphylococcus aureus: POSITIVE — AB

## 2014-06-04 MED ORDER — WARFARIN SODIUM 7.5 MG PO TABS
7.5000 mg | ORAL_TABLET | Freq: Once | ORAL | Status: AC
  Administered 2014-06-04: 7.5 mg via ORAL
  Filled 2014-06-04: qty 1

## 2014-06-04 MED ORDER — METOLAZONE 2.5 MG PO TABS
2.5000 mg | ORAL_TABLET | Freq: Once | ORAL | Status: AC
  Administered 2014-06-04: 2.5 mg via ORAL
  Filled 2014-06-04: qty 1

## 2014-06-04 NOTE — Consult Note (Signed)
[address]       [place] [address]             [phone]        Luke Pittman [facility] Medical Record #[id number] Date of Birth: 16-Nov-1945  Referring: No ref. 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 The patient had three-vessel CABG 22 years ago. That time his ejection fraction was mildly reduced. 3 years ago he developed symptoms of class III heart failure with reduction EF 20-25 percent. He underwent repeat catheterization which demonstrated patent grafts to the posterior descending, LAD, circumflex. LVEDP was 15. The patient was treated medically. However his symptoms of heart failure have progressed. He is currently class IIIB and can tolerate hardly any activity. He was admitted to cardiology and he was felt to be in cardiogenic shock with co-OX 43%. Echocardiogram at that time demonstrated EF 15-20 percent, oderate-severe RV dysfunction, mild TR mild MR no AS or AI right heart catheterization demonstrated only hypertension with PVR 4.3, CVP was 22 and CVP/PCWP ratio 0.77. Pulmonary arterial pulsatility index (PA systolic minus PA diastolic/CVP was 1.5.. Patient was placed on milrinone with slow improvement of hemodynamics and good response to diuretics however creatinine has increased from 1.3-2.0. Mixed venous saturation today 80%. However CVP remains 20.  The patient was started on Coumadin this hospitalization for atrial arrhythmias. He has no history of upper or lower GI blood loss but has not had colonoscopy in the past. The patient does have  chronic anemia with hemoglobin 9.5.  The patient has diabetes fairly poorly controlled with A1c 8.5. He does check his CBGs at home.  The patient has no symptoms of peripheral rash or disease and pre-LVAD Dopplers are pending.  Previous abdominal ultrasound shows ascites without cirrhosis and evidence of gallstones    Current Activity/ Functional Status: Unable to tolerate normal activity without symptoms  He denies orthopnea or PND   Zubrod Score: At the time of surgery this patient's most appropriate activity status/level should be described as:     0    Normal activity, no symptoms     1    Restricted in physical strenuous activity but ambulatory, able to do out light work     2    Ambulatory and capable of self care, unable to do work activities, up and about                 more than 50%  Of the time                                3    Only limited self care, in bed greater than 50% of waking hours     4    Completely disabled, no self care, confined to bed or chair     5    Moribund  Past Medical History  Diagnosis Date  . CAD (coronary artery disease)     a. s/p CABG 1994;  b.  Patent grafts 07/2011  . Chronic systolic CHF (congestive heart failure)  a. EF 25% in 2005;  b. Echo 07/2011: EF 20-25%  . Anemia   . Hyperlipidemia   . Hyperkalemia     a. 07/2011 ->acei d/c'd.  . Ischemic cardiomyopathy   . Type II diabetes mellitus   . Stroke 01/05/2013    "possibility; lost hearing in left ear; not sure it wasn't caused by fluid pill"  . 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 Cardiac catheterization  1994; ~ 07/2011  . Coronary artery bypass graft  1994    "CABG X4", LIMA to the LAD, sequential SVG to first and second obtuse marginal, sequential SVG to the right coronary artery.  . Left heart catheterization with coronary/graft angiogram N/A 08/05/2011    Procedure: LEFT HEART CATHETERIZATION WITH Isabel CapriceORONARY/GRAFT  ANGIOGRAM;  Surgeon: Laurey Moralealton S McLean, MD;  [facility];  Service: Cardiovascular;  Laterality: N/A;  . Right heart catheterization N/A 06/16/2014    Procedure: RIGHT HEART CATH;  Surgeon: Laurey Moralealton S McLean, MD;  [facility];  Service: Cardiovascular;  Laterality: N/A;    History  Smoking status  . Former Smoker -- 0.50 packs/day for 1 years  . Types: Cigarettes  Smokeless tobacco  . Never Used    Comment: "quit smoking when I was ~ 16"    History  Alcohol Use  . Yes    Comment: "drank some alcohol years and years ago"    History   Social History  . Marital Status: Married    Spouse Name: N/A  . Number of Children: 2  . Years of Education: N/A   Occupational History  . Not on file.   Social History Main Topics  . Smoking status: Former Smoker -- 0.50 packs/day for 1 years    Types: Cigarettes  . Smokeless tobacco: Never Used     Comment: "quit smoking when I was ~ 16"  . Alcohol Use: Yes     Comment: "drank some alcohol years and years ago"  . Drug Use: No  . Sexual Activity: No   Other Topics Concern  . Not on file   Social History Narrative   [place].     Allergies  Allergen Reactions  . Beta Adrenergic Blockers Other (See Comments)    Swollen hands, Per patient currently ([id number]) tolerating Coreg.  . Fish Oil Diarrhea  . Januvia [Sitagliptin Phosphate] Other (See Comments)    Leg cramps  . Tetanus-Diphtheria Toxoids Td Swelling    Current Facility-Administered Medications  Medication Dose Route Frequency Provider Last Rate Last Dose  . 0.9 %  sodium chloride infusion   Intravenous Continuous Laurey Moralealton S McLean, MD   Stopped at 06/01/14 2000  . acetaminophen (TYLENOL) tablet 650 mg  650 mg Oral Q4H PRN Laurey Moralealton S McLean, MD   650 mg at 06/03/14 1524  . amiodarone (NEXTERONE PREMIX) 360 MG/200ML (1.8 mg/mL) IV infusion  30 mg/hr Intravenous Continuous Laurey Moralealton S McLean, MD 16.7 mL/hr at 06/04/14 1700 30 mg/hr at 06/04/14  1700  . aspirin chewable tablet 81 mg  81 mg Oral Daily Azalee CourseHao Meng, PA   81 mg at 06/04/14 0500  . ezetimibe (ZETIA) tablet 10 mg  10 mg Oral Daily Azalee CourseHao Meng, [place]   10 mg at 06/04/14 1028  . fluticasone (FLONASE) 50 MCG/ACT nasal spray 1 spray  1 spray Each Nare Daily PRN Azalee CourseHao Meng, PA      . furosemide (LASIX) 250 mg in dextrose  5 % 250 mL (1 mg/mL) infusion  15 mg/hr Intravenous Continuous Laurey Morale, MD 15 mL/hr at 06/04/14 1700 15 mg/hr at 06/04/14 1700  . furosemide (LASIX) injection 40 mg  40 mg Intravenous Once Laurey Morale, MD   40 mg at 06/03/14 1015  . heparin ADULT infusion 100 units/mL (25000 units/250 mL)  1,100 Units/hr Intravenous Continuous Suzan Slick McCallister, RPH 11 mL/hr at 06/04/14 1700 1,100 Units/hr at 06/04/14 1700  . HYDROcodone-acetaminophen (NORCO/VICODIN) 5-325 MG per tablet 1-2 tablet  1-2 tablet Oral Q6H PRN Haydee Salter, MD   1 tablet at 06/03/14 1758  . insulin aspart (novoLOG) injection 0-15 Units  0-15 Units Subcutaneous TID WC Laurey Morale, MD   8 Units at 06/04/14 1743  . insulin aspart (novoLOG) injection 0-5 Units  0-5 Units Subcutaneous QHS Laurey Morale, MD   2 Units at 06/02/14 2207  . insulin detemir (LEVEMIR) injection 10 Units  10 Units Subcutaneous QHS Laurey Morale, MD   10 Units at 06/03/14 2155  . milrinone (PRIMACOR) 20 MG/100ML (0.2 mg/mL) infusion  0.375 mcg/kg/min Intravenous Continuous Laurey Morale, MD 8.8 mL/hr at 06/04/14 1700 0.375 mcg/kg/min at 06/04/14 1700  . MUSCLE RUB CREA   Topical PRN Rollene Rotunda, MD   1 application at 06/03/14 0600  . nateglinide (STARLIX) tablet 60 mg  60 mg Oral TID WC Laurey Morale, MD   60 mg at 06/04/14 1743  . NEOMYCIN-POLYMYXIN-HYDROCORTISONE (CORTISPORIN) otic solution 3 drop  3 drop Right Ear QID Azalee Course, PA   3 drop at 06/04/14 1744  . ondansetron (ZOFRAN) injection 4 mg  4 mg Intravenous Q6H PRN Laurey Morale, MD   4 mg at 06/03/14 1356  . pravastatin (PRAVACHOL) tablet 80 mg  80 mg Oral q1800  Laurey Morale, MD   80 mg at 06/04/14 1743  . silver sulfADIAZINE (SILVADENE) 1 % cream   Topical BID Laurey Morale, MD      . sodium chloride 0.9 % injection 10-40 mL  10-40 mL Intracatheter PRN Rollene Rotunda, MD      . warfarin (COUMADIN) video   Does not apply Once Benny Lennert, [facility]      . Warfarin - Pharmacist Dosing Inpatient   Does not apply q1800 Benny Lennert, [facility]        Prescriptions prior to admission  Medication Sig Dispense Refill Last Dose  . acetaminophen (TYLENOL) 650 MG CR tablet Take 650 mg by mouth every 8 (eight) hours as needed for pain.   Past Week at Unknown time  . acetic acid-hydrocortisone (VOSOL-HC) otic solution Place 4 drops into both ears 2 (two) times daily.  2 05/28/2014 at Unknown time  . aspirin 81 MG tablet Take 81 mg by mouth every morning.    05/28/2014 at Unknown time  . BENFOTIAMINE PO Take 250 mg by mouth daily.   05/28/2014 at Unknown time  . bumetanide (BUMEX) 2 MG tablet Take 2 mg by mouth as needed (for weight gain of 3lbs or more (174lbs)).  270 tablet 3 Past Month at Unknown time  . carvedilol (COREG) 3.125 MG tablet Take 1 tablet (3.125 mg total) by mouth at bedtime. 90 tablet 3 05/28/2014 at Unknown time  . ezetimibe (ZETIA) 10 MG tablet Take 1 tablet (10 mg total) by mouth daily. (Patient taking differently: Take 5 mg by mouth daily. ) 90 tablet 3 05/28/2014 at Unknown time  . fluticasone (FLONASE) 50 MCG/ACT nasal spray Place 1  spray into both nostrils daily as needed for allergies or rhinitis.   Past Week at Unknown time  . glimepiride (AMARYL) 4 MG tablet Take 0.5 tablets (2 mg total) by mouth daily before breakfast. 30 tablet 3 05/28/2014 at Unknown time  . lisinopril (PRINIVIL,ZESTRIL) 5 MG tablet Take 2.5 mg by mouth daily.  90 tablet 3 05/28/2014 at Unknown time  . Menthol-Methyl Salicylate (MUSCLE RUB) 10-15 % CREA Apply 1 application topically 3 (three) times daily.    05/28/2014 at Unknown time  . metFORMIN (GLUCOPHAGE) 500 MG  tablet Take 1-3 tablets (500-1,500 mg total) by mouth 2 (two) times daily with a meal. Takes  in morning and 1500 in evening (Patient taking differently: Take 500 mg by mouth 2 (two) times daily with a meal. Takes  in morning and 500 in evening) 180 tablet 3 05/28/2014 at Unknown time  . metolazone (ZAROXOLYN) 2.5 MG tablet Take 1 tablet by mouth daily as needed (Patient taking differently: Take 2.5 mg by mouth as needed (for weight gain of 3 lbs or more (174 lbs)). Take 1 tablet by mouth daily as needed) 90 tablet 1 Past Month at Unknown time  . pravastatin (PRAVACHOL) 80 MG tablet Take 1 tablet (80 mg total) by mouth daily. 90 tablet 3 05/28/2014 at Unknown time  . silver sulfADIAZINE (SILVADENE) 1 % cream Apply 1 application topically daily.   05/28/2014 at Unknown time    Family History  Problem Relation Age of Onset  . Heart failure Mother   . Heart attack Father 55  . Heart attack Brother 88    Deceased at 70 from heart failure     Review of Systems:  Patient is right-hand dominant He recovered from his CABG operation uneventfully He has poor hearing from bilateral ear problems.    Cardiac Review of Systems: Y or N  Chest Pain [ no   ]  Resting SOB [  no ] Exertional SOB  Mahler.Beck  ]  Orthopnea no [  ]   Pedal Edema [ yes  ]    Palpitations [ yes ] Syncope  [no  ]   Presyncope [ no  ]  General Review of Systems: [Y] = yes [  ]=no Constitional: recent weight change yes [  ]; anorexia Mahler.Beck  ]; fatigue Mahler.Beck  ]; nausea [ yes ]; night sweats [  ]; fever [ no no ]; or chills [no  ]                                                               Dental: poor dentition yes [  ]; Last Dentist visit greater than 2 years:   Eye : blurred vision [  ]; diplopia [   ]; vision changes [  ];  Amaurosis fugax[  ]; Resp: cough [  ];  wheezing[  ];  hemoptysis[  ]; shortness of breath[  ]; paroxysmal nocturnal dyspnea[  ]; dyspnea on exertion[yes  ]; or orthopnea[  ];  GI:  gallstones[  ], vomiting[   ];  dysphagia[  ]; melena[  ];  hematochezia [  ]; heartburn[  ];   Hx of  Colonoscopy[  ]; GU: kidney stones [  ]; hematuria[  ];   dysuria [  ];  nocturia[  ];  history of     obstruction [  ]; urinary frequency [  ]             Skin: rash, swelling[ yes ];, hair loss[  ];  peripheral edema[  ];  or itching[  ]; Musculosketetal: myalgias[  ];  joint swelling[  ];  joint erythema[  ];  joint pain[  ];  back pain[  ];  Heme/Lymph: bruising[  ];  bleeding[  ];  anemia[  ];  Neuro: TIA[  ];  headaches[  ];  stroke[  ];  vertigo[  ];  seizures[  ];   paresthesias[  ];  difficulty walking[  ];  Psych:depression[  ]; anxiety[  ];  Endocrine: diabetes[yes  ];  thyroid dysfunction[  ];  Immunizations: Flu [  ]; Pneumococcal[  ];  Other:  Physical Exam: BP 95/60 mmHg  Pulse 84  Temp(Src) 98.3 F (36.8 C) (Oral)  Resp 29  Ht 5\' 11"  (1.803 m)  Wt 174 lb 2.6 oz (79 kg)  BMI 24.30 kg/m2  SpO2 93%    General: Middle-aged Caucasian male appears chronically ill coming by family distress  HEENT: Normocephalic pupils equal , dentition adequate Neck: Supple without JVD, adenopathy, or bruit Chest: Clear to auscultation, symmetrical breath sounds, no rhonchi, no tenderness             or deformity Cardiovascular: Irregular rate and rhythm, no murmur, +S3 gallop, peripheral pulses absent in feet             palpable in upper l extremities Abdomen:  Soft, nontender, no palpable mass or organomegaly Extremities: Warm, well-perfused, no clubbing cyanosis, mild  edema or tenderness,              no venous stasis changes of the legs Rectal/GU: Deferred Neuro: Grossly non--focal and symmetrical throughout Skin: Clean and dry without rash or ulceration    Diagnostic Studies & Laboratory data:     Recent Radiology Findings:   Ct Chest Wo Contrast  06/04/2014   CLINICAL DATA:  Shortness of breath, history of heart failure  EXAM: CT CHEST WITHOUT CONTRAST  TECHNIQUE: Multidetector CT imaging of the  chest was performed following the standard protocol without IV contrast.  COMPARISON:  05/31/2014  FINDINGS: Mild peribronchial wall thickening. Mild interstitial change with a few Kerley B-lines bilaterally. No alveolar opacification. Small bilateral pleural effusions larger on the left. No significant pericardial effusion.  Severe cardiac enlargement and coronary artery calcification. Mild thoracic aortic calcification.  Right PICC line tip extends to the cavoatrial junction.  Images through the upper abdomen show a small volume of ascites.  No acute musculoskeletal findings. No significant hilar or mediastinal adenopathy.  IMPRESSION: Findings consistent with congestive heart failure with mild interstitial pulmonary edema.   Electronically Signed   By: Esperanza Heir M.D.   On: 06/04/2014 16:08     I have independently reviewed the above radiologic studies.  Recent Lab Findings: Lab Results  Component Value Date   WBC 5.9 06/04/2014   HGB 8.8* 06/04/2014   HCT 27.5* 06/04/2014   PLT 165 06/04/2014   GLUCOSE 120* 06/04/2014   CHOL 109 04/27/2014   TRIG 42 04/27/2014   HDL 51 04/27/2014   LDLCALC 76 12/13/2013   ALT 79* 06/04/2014   AST 32 06/04/2014   NA 125* 06/04/2014   K 4.4 06/04/2014   CL 91* 06/04/2014   CREATININE 2.17* 06/04/2014   BUN 36* 06/04/2014   CO2 28 06/04/2014   TSH  4.583* 08/04/2011   INR 1.37 06/04/2014   HGBA1C 8.4% 04/27/2014      Assessment / Plan:    Patient has severe biventricular dysfunction with patent grafts-probable mixed ischemic nonischemic cardiomyopathy  His RV function by echo and by hemodynamic parameters-CVP/wedge ratio and pulmonary artery pulsatility index indicate high risk of failure from implantable LVAD. However these measurements were made before he was started on milrinone and would recommend repeat right heart cath. Patient has chronic anemia, unexplained and will need upper and lower endoscopy. With RV significant issues transplant  evaluation soon would be optimal. We'll follow.     06/04/2014 6:30 PM

## 2014-06-04 NOTE — Progress Notes (Addendum)
Patient ID: Luke Pittman, male   DOB: [birth year], 69 y.o.   MRN: [id number]   SUBJECTIVE:   Patient continues to feel well but CVP remains 22 today.  Co-ox 61% on milrinone 0.25.  He remains in atrial flutter but HR in 90s on amiodarone gtt.  Creatinine rising.   ECHO EF 20% mild LVH  RV mod-severely dilated with moderate-severe systolic dysfunction .   RHC: 06/26/2014.  RA mean 22 RV 57/24 PA 61/29, mean 43 PCWP mean 29 Oxygen saturations: PA 34% AO 95% Cardiac Output (Fick) 3.24  Cardiac Index (Fick) 1.64 PVR 4.3 WU Cardiac Output (Thermo) 3.23 Cardiac Index (Thermo) 1.63  Filed Vitals:   06/03/14 2311 06/03/14 2358 06/04/14 0308 06/04/14 0758  BP: 95/57  87/52 107/54  Pulse: 91  83 84  Temp: 97.3 F (36.3 C)  97.5 F (36.4 C) 97.4 F (36.3 C)  TempSrc: Oral  Oral Oral  Resp: 19 20 17 20   Height:      Weight:   174 lb 2.6 oz (79 kg)   SpO2: 96%  95% 95%    Intake/Output Summary (Last 24 hours) at 06/04/14 0939 Last data filed at 06/04/14 0900  Gross per 24 hour  Intake 2316.4 ml  Output   1725 ml  Net  591.4 ml    LABS: Basic Metabolic Panel:  Recent Labs  40/98/1101/07/14 0500 06/03/14 0640  06/03/14 1400 06/04/14 0354  NA 129* 125*  < > 127* 125*  K 3.9 6.0*  < > 5.0 4.4  CL 93* 89*  < > 89* 91*  CO2 28 24  < > 24 28  GLUCOSE 244* 171*  < > 270* 120*  BUN 19 29*  < > 33* 36*  CREATININE 1.38* 1.78*  < > 1.99* 2.17*  CALCIUM 8.0* 8.5  < > 8.3* 8.3*  MG 1.8 2.2  --   --   --   < > = values in this interval not displayed. Liver Function Tests:  Recent Labs  06/03/14 0816 06/04/14 0354  AST 34 32  ALT 91* 79*  ALKPHOS 107 110  BILITOT 1.5* 1.3*  PROT 6.0 5.8*  ALBUMIN 3.3* 3.2*   No results for input(s): LIPASE, AMYLASE in the last 72 hours. CBC:  Recent Labs  06/03/14 0640 06/04/14 0354  WBC 7.1 5.9  HGB 9.5* 8.8*  HCT 29.1* 27.5*  MCV 71.1* 71.4*  PLT 205 165   Cardiac Enzymes: No results for input(s): CKTOTAL, CKMB, CKMBINDEX,  TROPONINI in the last 72 hours. BNP: Invalid input(s): POCBNP D-Dimer: No results for input(s): DDIMER in the last 72 hours. Hemoglobin A1C: No results for input(s): HGBA1C in the last 72 hours. Fasting Lipid Panel: No results for input(s): CHOL, HDL, LDLCALC, TRIG, CHOLHDL, LDLDIRECT in the last 72 hours. Thyroid Function Tests: No results for input(s): TSH, T4TOTAL, T3FREE, THYROIDAB in the last 72 hours.  Invalid input(s): FREET3 Anemia Panel: No results for input(s): VITAMINB12, FOLATE, FERRITIN, TIBC, IRON, RETICCTPCT in the last 72 hours.  RADIOLOGY: Dg Chest 2 View  2014/12/07   CLINICAL DATA:  Congestive failure  EXAM: CHEST  2 VIEW  COMPARISON:  10/03/2013  FINDINGS: Cardiac shadow remains enlarged. Postsurgical changes are again seen. No vascular congestion is seen. Very minimal effusions are noted bilaterally. No focal infiltrate is noted. No bony abnormality is seen.  IMPRESSION: Small effusions.  No other significant abnormality is noted.   Electronically Signed   By: Alcide CleverMark  Lukens M.D.   On: 02016/09/08 13:44  US Abdomen Complete  05/04/2014   CLINICAL DATA:  Increased liver function test. History of diabetes, chronic kidney disease and patient is taking Cymbalta  EXAM: ULTRASOUND ABDOMEN COMPLETE  COMPARISON:  None.  FINDINGS: Gallbladder: Cholelithiasis without pericholecystic fluid. Gallbladder wall wall thickening measuring 5.6 mm. Negative sonographic Murphy sign.  Common bile duct: Diameter: 2.5 mm  Liver: No focal lesion identified. Within normal limits in parenchymal echogenicity.  IVC: No abnormality visualized.  Pancreas: Limited visualization secondary to overlying bowel gas.  Spleen: Size and appearance within normal limits.  Right Kidney: Length: 11.5 cm. Echogenicity within normal limits. No mass or hydronephrosis visualized.  Left Kidney: Length: 11.6 cm. Echogenicity within normal limits. No mass or hydronephrosis visualized.  Abdominal aorta: No aneurysm visualized.   Other findings: There is a small amount of ascites. There are bilateral pleural effusions.  IMPRESSION: 1. Cholelithiasis. Gallbladder wall thickening which may be secondary to intrinsic gallbladder wall abnormality suggest cholecystitis, but can also be seen in the setting of ascites and hepatocellular disease versus hypoalbuminemia versus fluid overload. The lateral etiologies are favored over cholecystitis. 2. Small amount of ascites. 3. Bilateral pleural effusions.   Electronically Signed   By: Elige Ko   On: 05/24/2014 14:56   Dg Chest Port 1 View  05/31/2014   CLINICAL DATA:  PICC line placement  EXAM: PORTABLE CHEST - 1 VIEW  COMPARISON:  05/27/2014  FINDINGS: Right-sided PICC line with the tip projecting over the cavoatrial junction. No focal consolidation or pneumothorax. Trace left pleural effusion. There is stable cardiomegaly. There is evidence of prior CABG.  Mild osteoarthritis of bilateral glenohumeral joints.  IMPRESSION: Right-sided PICC line with the tip projecting over the cavoatrial junction.   Electronically Signed   By: Elige Ko   On: 05/31/2014 16:43    PHYSICAL EXAM CVP 22  General: NAD Neck: JVP 16 cm, no thyromegaly or thyroid nodule.  Lungs: Decreased breath sounds at bases.  CV: Nondisplaced PMI.  Heart mildly tachy, regular S1/S2, no S3/S4, no murmur.  1+ ankle edema.   Abdomen: Soft, nontender, no hepatosplenomegaly, mildly distended  Psych: Normal affect. Extremities: No clubbing or cyanosis.   TELEMETRY: Reviewed telemetry pt in atrial flutter in 80s.   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  1. Acute on chronic systolic CHF: EF 16% with at least moderate RV dysfunction by echo. He does not have an ICD (has not wanted in past).  He presented with NYHA class IIIb symptoms (dyspnea and profound fatigue), poor appetite, and markedly elevated transaminases. He remains volume overloaded on exam.  BP is stable. RHC showed elevated filling pressures and low output.  On RHC, RA/PCWP = 0.75.  Milrinone gtt was begun 3/2 and he felt much better/more alert.  Milrinone turned down to 0.125 on 3/3 with fall in co-ox.  Milrinone back up to 0.25, co-ox 61% with CVP 22.  Creatinine now rising.   - Hold Coreg for now with low output.  - Off spironolactone and lisinopril with hyperkalemia/elevated creatinine.  Also stopped digoxin for now.  - CVP very high and creatinine up.  I am going to increase milrinone to 0.375 today with repeat co-ox.  Will increase Lasix gtt to 15 mg/hr + metolazone 2.5 x 1.   -  He has seen LVAD coordinator.  He will be a difficult candidate for LVAD due to RV dysfunction, but he will also be a difficult candidate for home inotropes given  arrhythmias. Will need to discuss further with team on Monday.  Dr Donata Clay to see today.  2. AKI: Cardiorenal syndrome.  Some improvement in creatinine initially with diuresis but creatinine back up.  Output still relatively marginal.  Will increase milrinone to 0.375 to see if increased output will improve creatinine.   3. CAD: S/p CABG. Cath in 2013 with patent grafts. No chest pain. Continue ASA, LFTs back down so restarted statin.   4. Elevated transaminases: Suspect hepatic congestion from CHF. He is clearly volume overloaded. LFTs coming down with diuresis, abdominal US showed normal liver echotexture. 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   5. Diabetes: added nateglinide per diabetes coordinator.  Not good candidate for metformin in the future with CKD.  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  7. Atrial fibrillation: Appears to have gone into atrial fibrillation => flutter this admission on milrinone.  He is on heparin gtt and amiodarone gtt, HR 80s.  Now on warfarin.  Will need to decide on DCCV => not sure he will maintain NSR if we are unable to wean off milrinone.  8. Hyponatremia: Fluid restrict, 1500 cc.  If  creatinine remains stable and sodium stays low, may add tolvaptan.   Marca Ancona   06/04/2014 9:39 AM

## 2014-06-04 NOTE — Progress Notes (Signed)
ANTICOAGULATION CONSULT NOTE - Follow Up Consult  Pharmacy Consult for Heparin and Warfarin Indication: atrial fibrillation  Allergies  Allergen Reactions  . Beta Adrenergic Blockers Other (See Comments)    Swollen hands, Per patient currently ([id number](Jul2015) tolerating Coreg.  . Fish Oil Diarrhea  . Januvia [Sitagliptin Phosphate] Other (See Comments)    Leg cramps  . Tetanus-Diphtheria Toxoids Td Swelling    Patient Measurements: Height: 5\' 11"  (180.3 cm) Weight: 174 lb 2.6 oz (79 kg) IBW/kg (Calculated) : 75.3  Vital Signs: Temp: 97.4 F (36.3 C) (03/06 0758) Temp Source: Oral (03/06 0758) BP: 107/54 mmHg (03/06 0758) Pulse Rate: 84 (03/06 0758)  Labs:  Recent Labs  06/02/14 0500 06/03/14 [id number] 06/03/14 0640 06/03/14 0816 06/03/14 1400 06/04/14 0354  HGB 9.0*  --  9.5*  --   --  8.8*  HCT 28.3*  --  29.1*  --   --  27.5*  PLT 204  --  205  --   --  165  LABPROT  --  16.1*  --   --   --  17.0*  INR  --  1.28  --   --   --  1.37  HEPARINUNFRC 0.61 0.38  --   --   --  0.52  CREATININE 1.38*  --  1.78* 1.88* 1.99* 2.17*    Estimated Creatinine Clearance: 34.7 mL/min (by C-G formula based on Cr of 2.17).   Assessment: 69 yo male with acute systolic HF on milrinone and also with afib on heparin at 1100 units/hr Heparin level is at goal (HL= 0.52). Patient began warfarin on 3/4 and received 5 mg X 2 days.  INR has trended up slightly 1.28>>1.37. Hg= 8.8, plt= 165. No signs of bleeding noted.  Will continue heparin at current rate. Plan to give warfarin 7.5 mg X 1 tonight, as INR is still right around the patient's baseline.  Of note, patient is on amiodarone drip.  Pharmacy will monitor closely as this agent has the potential to increase warfarin sensitivity.  Goal of Therapy:  INR 2-3 Heparin level 0.3-0.7 units/ml Monitor platelets by anticoagulation protocol: Yes   Plan:  - Continue Heparin at 1100 units/hr  - Coumadin 7.5 mg x 1 tonight - Daily HL, INR, CBC,  s/sx bleeding  Russ HaloAshley Makoa Satz, PharmD Clinical Pharmacist - Resident Pager: [phone] 3/6/201612:08 PM

## 2014-06-05 ENCOUNTER — Inpatient Hospital Stay (HOSPITAL_COMMUNITY): Payer: Commercial Managed Care - HMO

## 2014-06-05 DIAGNOSIS — I5023 Acute on chronic systolic (congestive) heart failure: Secondary | ICD-10-CM

## 2014-06-05 LAB — CBC
HEMATOCRIT: 26.1 % — AB (ref 39.0–52.0)
HEMOGLOBIN: 8.5 g/dL — AB (ref 13.0–17.0)
MCH: 22.8 pg — ABNORMAL LOW (ref 26.0–34.0)
MCHC: 32.6 g/dL (ref 30.0–36.0)
MCV: 70.2 fL — ABNORMAL LOW (ref 78.0–100.0)
Platelets: 145 10*3/uL — ABNORMAL LOW (ref 150–400)
RBC: 3.72 MIL/uL — AB (ref 4.22–5.81)
RDW: 17.1 % — ABNORMAL HIGH (ref 11.5–15.5)
WBC: 6.1 10*3/uL (ref 4.0–10.5)

## 2014-06-05 LAB — COMPREHENSIVE METABOLIC PANEL
ALBUMIN: 3.1 g/dL — AB (ref 3.5–5.2)
ALT: 69 U/L — ABNORMAL HIGH (ref 0–53)
ANION GAP: 10 (ref 5–15)
AST: 31 U/L (ref 0–37)
Alkaline Phosphatase: 111 U/L (ref 39–117)
BUN: 42 mg/dL — ABNORMAL HIGH (ref 6–23)
CALCIUM: 8.6 mg/dL (ref 8.4–10.5)
CHLORIDE: 89 mmol/L — AB (ref 96–112)
CO2: 28 mmol/L (ref 19–32)
CREATININE: 2.12 mg/dL — AB (ref 0.50–1.35)
GFR calc Af Amer: 35 mL/min — ABNORMAL LOW (ref >90)
GFR calc non Af Amer: 30 mL/min — ABNORMAL LOW (ref >90)
Glucose, Bld: 78 mg/dL (ref 70–99)
Potassium: 3.9 mmol/L (ref 3.5–5.1)
Sodium: 127 mmol/L — ABNORMAL LOW (ref 135–145)
Total Bilirubin: 1.3 mg/dL — ABNORMAL HIGH (ref 0.3–1.2)
Total Protein: 5.9 g/dL — ABNORMAL LOW (ref 6.0–8.3)

## 2014-06-05 LAB — ANTITHROMBIN III: AntiThromb III Func: 56 % — ABNORMAL LOW (ref 75–120)

## 2014-06-05 LAB — GLUCOSE, CAPILLARY
GLUCOSE-CAPILLARY: 206 mg/dL — AB (ref 70–99)
Glucose-Capillary: 244 mg/dL — ABNORMAL HIGH (ref 70–99)
Glucose-Capillary: 89 mg/dL (ref 70–99)
Glucose-Capillary: 214 mg/dL — ABNORMAL HIGH (ref 70–99)
Glucose-Capillary: 138 mg/dL — ABNORMAL HIGH (ref 70–99)

## 2014-06-05 LAB — PREPARE RBC (CROSSMATCH)

## 2014-06-05 LAB — TSH: TSH: 7.212 u[IU]/mL — ABNORMAL HIGH (ref 0.350–4.500)

## 2014-06-05 LAB — RETICULOCYTES
RBC.: 3.93 MIL/uL — ABNORMAL LOW (ref 4.22–5.81)
Retic Count, Absolute: 51.1 10*3/uL (ref 19.0–186.0)
Retic Ct Pct: 1.3 % (ref 0.4–3.1)

## 2014-06-05 LAB — PROTIME-INR
INR: 1.39 (ref 0.00–1.49)
PROTHROMBIN TIME: 17.2 s — AB (ref 11.6–15.2)

## 2014-06-05 LAB — FERRITIN: Ferritin: 76 ng/mL (ref 22–322)

## 2014-06-05 LAB — HEPARIN LEVEL (UNFRACTIONATED)
Heparin Unfractionated: 0.22 IU/mL — ABNORMAL LOW (ref 0.30–0.70)
Heparin Unfractionated: 0.38 IU/mL (ref 0.30–0.70)

## 2014-06-05 LAB — CARBOXYHEMOGLOBIN
CARBOXYHEMOGLOBIN: 1.8 % — AB (ref 0.5–1.5)
Methemoglobin: 0.9 % (ref 0.0–1.5)
O2 SAT: 56.8 %
TOTAL HEMOGLOBIN: 10.8 g/dL — AB (ref 13.5–18.0)

## 2014-06-05 LAB — LACTATE DEHYDROGENASE: LDH: 225 U/L (ref 94–250)

## 2014-06-05 LAB — VITAMIN B12: Vitamin B-12: 655 pg/mL (ref 211–911)

## 2014-06-05 LAB — URIC ACID: Uric Acid, Serum: 8.8 mg/dL — ABNORMAL HIGH (ref 4.0–7.8)

## 2014-06-05 LAB — ABO/RH: ABO/RH(D): A POS

## 2014-06-05 LAB — IRON AND TIBC
Iron: 18 ug/dL — ABNORMAL LOW (ref 42–165)
Saturation Ratios: 4 % — ABNORMAL LOW (ref 20–55)
TIBC: 458 ug/dL — ABNORMAL HIGH (ref 215–435)
UIBC: 440 ug/dL — ABNORMAL HIGH (ref 125–400)

## 2014-06-05 LAB — FOLATE: Folate: 13.4 ng/mL

## 2014-06-05 MED ORDER — POTASSIUM CHLORIDE CRYS ER 20 MEQ PO TBCR
20.0000 meq | EXTENDED_RELEASE_TABLET | Freq: Once | ORAL | Status: AC
  Administered 2014-06-05: 20 meq via ORAL
  Filled 2014-06-05: qty 1

## 2014-06-05 MED ORDER — METOLAZONE 2.5 MG PO TABS
2.5000 mg | ORAL_TABLET | Freq: Once | ORAL | Status: AC
  Administered 2014-06-05: 2.5 mg via ORAL
  Filled 2014-06-05: qty 1

## 2014-06-05 MED ORDER — PANTOPRAZOLE SODIUM 40 MG PO TBEC
40.0000 mg | DELAYED_RELEASE_TABLET | Freq: Every day | ORAL | Status: DC
  Administered 2014-06-05 – 2014-06-16 (×12): 40 mg via ORAL
  Filled 2014-06-05 (×11): qty 1

## 2014-06-05 MED ORDER — LACTULOSE 10 GM/15ML PO SOLN
20.0000 g | Freq: Every day | ORAL | Status: DC
Start: 2014-06-05 — End: 2014-06-17
  Administered 2014-06-05 – 2014-06-13 (×9): 20 g via ORAL
  Filled 2014-06-05 (×13): qty 30

## 2014-06-05 MED ORDER — OVER THE COUNTER MEDICATION: 1.0000 | Freq: Every day | Status: DC

## 2014-06-05 MED ORDER — NON FORMULARY: 1.0000 | Freq: Every day | Status: DC

## 2014-06-05 MED ORDER — MEDICATION ORAL SOLUTION/SUSPENSION BUILDER
1.0000 | Freq: Every day | ORAL | Status: DC
  Administered 2014-06-05 – 2014-06-14 (×10): 1 via ORAL
  Filled 2014-06-05 (×6): qty 250

## 2014-06-05 NOTE — Progress Notes (Signed)
CARDIAC REHAB PHASE I   PRE:  Rate/Rhythm: 83 aflutter    BP: sitting 98/58    SaO2: 98 RA  MODE:  Ambulation: 970 ft   POST:  Rate/Rhythm: 95 aflutter with PVCs    BP: sitting 97/39     SaO2: 99 RA  Tolerated well. Sts only slight SOB. Slight usteadiness he sts due to his hearing issues. Will plan 6 min walk test tomorrow. [phone]   Luke Pittman, Luke Pittman Saxtons RiverKristan CES, ACSM 06/05/2014 11:12 AM

## 2014-06-05 NOTE — Progress Notes (Signed)
VASCULAR LAB PRELIMINARY  PRELIMINARY  PRELIMINARY  PRELIMINARY  Bilateral lower extremity venous duplex completed.    Preliminary report:  NEGATIVE DVT BILATERALLY.  Negative Baker's Cyst bilaterally.  Loralie ChampagneBishop, Quinta Eimer F, RVT 06/05/2014, 5:16 PM

## 2014-06-05 NOTE — Progress Notes (Signed)
INITIAL NUTRITION ASSESSMENT  DOCUMENTATION CODES Per approved criteria  -Not Applicable   INTERVENTION: -Continue with current nutrition plan of care  NUTRITION DIAGNOSIS: Unintentional weight loss related to CHF, fluid overload as evidenced by 4.5% wt loss x 1 week.   Goal: Pt will meet >90% of estimated nutritional needs  Monitor:  PO intake, labs, weight changes, I/O's  Reason for Assessment: VAD evaluation  69 y.o. male  Admitting Dx: <principal problem not specified>  ASSESSMENT: S/p Procedure on 06/05/2014:  Right Heart Cath  RD last assessed pt on 06/24/2014. DM diet education provided at that time. He is being followed by a pharmacist/diabetes educator through his PCP office. Both he and his wife have good understanding of diet principles. Pt in very good spirits today. He reports his appetite was decreased PTA due to weakness. However, appetite has returned this hospitalization. He reports he is tolerating diet well, consuming 100% of meals.  Pt's UBW is 171#. He reports that he is still "fluid overloaded" and diuresis continues. Pt has experienced 4.5% wt loss within the past week, due to diuresis.  Nutrition-focused physical exam WDL. Reviewed Heart Healthy/Diabetic diet principles with pt. He has not further questions about diet restrictions at this time. Encouraged pt to continue to consume his meals to promote healing.  Na: 127, Cl: 89, BUN/Creat: 42/2.09. CBGS: 89-206.  Height: Ht Readings from Last 1 Encounters:  05/20/2014  (1.803 m)    Weight: Wt Readings from Last 1 Encounters:  06/05/14 168 lb 10.4 oz (76.5 kg)    Ideal Body Weight: 172#  % Ideal Body Weight: 98%  Wt Readings from Last 10 Encounters:  06/05/14 168 lb 10.4 oz (76.5 kg)  05/24/14 176 lb (79.833 kg)  05/11/14 179 lb (81.194 kg)  04/27/14 182 lb (82.555 kg)  04/26/14 182 lb (82.555 kg)  04/03/14 178 lb (80.74 kg)  12/14/13 166 lb (75.297 kg)  12/13/13 168 lb (76.204 kg)  10/26/13  168 lb (76.204 kg)  10/05/13 163 lb 6.4 oz (74.118 kg)    Usual Body Weight: 171#  % Usual Body Weight: 98%  BMI:  Body mass index is 23.53 kg/(m^2). Normal weight range  Estimated Nutritional Needs: Kcal: 2000-2200 Protein: 90-100 grams Fluid: 2.0-2.2 L  Skin: lt foot callous, ecchymosis  Diet Order: Diet 2 gram sodium  EDUCATION NEEDS: -Education needs addressed   Intake/Output Summary (Last 24 hours) at 06/05/14 1419 Last data filed at 06/05/14 1237  Gross per 24 hour  Intake 1293.04 ml  Output   3390 ml  Net -2096.96 ml    Last BM: 06/04/14  Labs:   Recent Labs Lab 06/01/14 1600 06/02/14 0500 06/03/14 0640  06/03/14 1400 06/04/14 0354 06/05/14 0345  NA 128* 129* 125*  < > 127* 125* 127*  K 3.9 3.9 6.0*  < > 5.0 4.4 3.9  CL 92* 93* 89*  < > 89* 91* 89*  CO2 32 28 24  < > BUN 21 19 29*  < > 33* 36* 42*  CREATININE 1.36* 1.38* 1.78*  < > 1.99* 2.17* 2.12*  CALCIUM 8.2* 8.0* 8.5  < > 8.3* 8.3* 8.6  MG 2.0 1.8 2.2  --   --   --   --   GLUCOSE 276* 244* 171*  < > 270* 120* 78  < > = values in this interval not displayed.  CBG (last 3)   Recent Labs  06/04/14 2124 06/05/14 0717 06/05/14 1240  GLUCAP 188* 89 206*  Lab Results  Component Value Date   HGBA1C 8.4% 04/27/2014   Scheduled Meds: . aspirin  81 mg Oral Daily  . ezetimibe  10 mg Oral Daily  . insulin aspart  0-15 Units Subcutaneous TID WC  . insulin aspart  0-5 Units Subcutaneous QHS  . insulin detemir  10 Units Subcutaneous QHS  . lactulose  20 g Oral Daily  . Mega Benfotiamine Antioxidant Capsule  1 capsule Oral Daily  . nateglinide  60 mg Oral TID WC  . NEOMYCIN-POLYMYXIN-HYDROCORTISONE  3 drop Right Ear QID  . pantoprazole  40 mg Oral Q1200  . pravastatin  80 mg Oral q1800  . silver sulfADIAZINE   Topical BID  . warfarin   Does not apply Once    Continuous Infusions: . sodium chloride Stopped (06/01/14 2000)  . amiodarone 30 mg/hr (06/05/14 1200)  . furosemide  (LASIX) infusion 15 mg/hr (06/05/14 1200)  . heparin 1,250 Units/hr (06/05/14 1200)  . milrinone 0.375 mcg/kg/min (06/05/14 1200)    Past Medical History  Diagnosis Date  . CAD (coronary artery disease)     a. s/p CABG 1994;  b.  Patent grafts 07/2011  . Chronic systolic CHF (congestive heart failure)     a. EF 25% in 2005;  b. Echo 07/2011: EF 20-25%  . Anemia   . Hyperlipidemia   . Hyperkalemia     a. 07/2011 ->acei d/c'd.  . Ischemic cardiomyopathy   . Type II diabetes mellitus   . Stroke 01/05/2013    "possibility; lost hearing in left ear; not sure it wasn't caused by fluid pill"  . 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 Cardiac catheterization  1994; ~ 07/2011  . Coronary artery bypass graft  1994    "CABG X4", LIMA to the LAD, sequential SVG to first and second obtuse marginal, sequential SVG to the right coronary artery.  . Left heart catheterization with coronary/graft angiogram N/A 08/05/2011    Procedure: LEFT HEART CATHETERIZATION WITH Isabel CapriceORONARY/GRAFT ANGIOGRAM;  Surgeon: Laurey Moralealton S McLean, MD;  [facility];  Service: Cardiovascular;  Laterality: N/A;  . Right heart catheterization N/A 06/26/2014    Procedure: RIGHT HEART CATH;  Surgeon: Laurey Moralealton S McLean, MD;  [facility];  Service: Cardiovascular;  Laterality: N/A;    Migel Hannis A. Mayford KnifeWilliams, RD, LDN, CDE Pager: [phone] After hours Pager: [phone]

## 2014-06-05 NOTE — Progress Notes (Signed)
VASCULAR LAB PRELIMINARY  PRELIMINARY  PRELIMINARY  PRELIMINARY  Carotid Duplex completed for Pre-VAD RT ICA/CCA ratio:  2.29   RT carotid stenosis within the 40-59% range. LT ICA/CCA ratio: 1.22   LT carotid stenosis within the 1-39% range. Antegrade vertebral flow bilaterally.  Incidental finding bilateral thryoid nodules.  Loralie ChampagneBishop, Karrie Fluellen F, RVT 06/05/2014, 4:25 PM

## 2014-06-05 NOTE — Progress Notes (Signed)
ANTICOAGULATION CONSULT NOTE - Follow Up Consult  Pharmacy Consult for Heparin and Warfarin Indication: atrial fibrillation  Allergies  Allergen Reactions  . Beta Adrenergic Blockers Other (See Comments)    Swollen hands, Per patient currently ([id number](Jul2015) tolerating Coreg.  . Fish Oil Diarrhea  . Januvia [Sitagliptin Phosphate] Other (See Comments)    Leg cramps  . Tetanus-Diphtheria Toxoids Td Swelling    Patient Measurements: Height: 5\' 11"  (180.3 cm) Weight: 168 lb 10.4 oz (76.5 kg) IBW/kg (Calculated) : 75.3  Vital Signs: Temp: 97.3 F (36.3 C) (03/07 1446) Temp Source: Axillary (03/07 1446) BP: 104/47 mmHg (03/07 1446) Pulse Rate: 83 (03/07 1446)  Labs:  Recent Labs  06/03/14 [id number]  06/03/14 0640  06/03/14 1400 06/04/14 0354 06/05/14 0345 06/05/14 1450  HGB  --   < > 9.5*  --   --  8.8* 8.5*  --   HCT  --   --  29.1*  --   --  27.5* 26.1*  --   PLT  --   --  205  --   --  165 145*  --   LABPROT 16.1*  --   --   --   --  17.0* 17.2*  --   INR 1.28  --   --   --   --  1.37 1.39  --   HEPARINUNFRC 0.38  --   --   --   --  0.52 0.22* 0.38  CREATININE  --   --  1.78*  < > 1.99* 2.17* 2.12*  --   < > = values in this interval not displayed.  Estimated Creatinine Clearance: 35.5 mL/min (by C-G formula based on Cr of 2.12).   Assessment: 69 yo male with acute systolic HF on milrinone and also with afib on heparin at 1250 units/hr.   Heparin level is at goal (HL= 0.38).   No bleeding noted, h/h dropped slightly transfuse today and draw iron studies.   Patient began warfarin on 3/4  INR only 1.37 but is being worked up for possible LVAD and needs EGD and colonoscopy.  Will hold warfarin for now until w/u complete.    Goal of Therapy:  INR 2-3 Heparin level 0.3-0.7 units/ml Monitor platelets by anticoagulation protocol: Yes   Plan:  - Continue Heparin at 1250 units/hr  - hold warfarin - Daily HL, CBC, s/sx bleeding  Leota SauersLisa Jendaya Gossett Pharm.D. CPP, BCPS Clinical  Pharmacist [phone] 06/05/2014 4:03 PM

## 2014-06-05 NOTE — Progress Notes (Signed)
ANTICOAGULATION CONSULT NOTE - Follow Up Consult  Pharmacy Consult for Heparin and Warfarin Indication: atrial fibrillation  Allergies  Allergen Reactions  . Beta Adrenergic Blockers Other (See Comments)    Swollen hands, Per patient currently ([id number](Jul2015) tolerating Coreg.  . Fish Oil Diarrhea  . Januvia [Sitagliptin Phosphate] Other (See Comments)    Leg cramps  . Tetanus-Diphtheria Toxoids Td Swelling    Patient Measurements: Height: 5\' 11"  (180.3 cm) Weight: 168 lb 10.4 oz (76.5 kg) IBW/kg (Calculated) : 75.3  Vital Signs: Temp: 98 F (36.7 C) (03/07 0300) Temp Source: Oral (03/07 0300) BP: 90/46 mmHg (03/07 0300)  Labs:  Recent Labs  06/03/14 [id number]  06/03/14 0640 06/03/14 0816 06/03/14 1400 06/04/14 0354 06/05/14 0345  HGB  --   < > 9.5*  --   --  8.8* 8.5*  HCT  --   --  29.1*  --   --  27.5* 26.1*  PLT  --   --  205  --   --  165 145*  LABPROT 16.1*  --   --   --   --  17.0* 17.2*  INR 1.28  --   --   --   --  1.37 1.39  HEPARINUNFRC 0.38  --   --   --   --  0.52 0.22*  CREATININE  --   < > 1.78* 1.88* 1.99* 2.17*  --   < > = values in this interval not displayed.  Estimated Creatinine Clearance: 34.7 mL/min (by C-G formula based on Cr of 2.17).   Assessment: 69 yo male with Afib for heparin  Goal of Therapy:  INR 2-3 Heparin level 0.3-0.7 units/ml Monitor platelets by anticoagulation protocol: Yes   Plan:  -Increase Heparin 1250 units/hr Check heparin level in 8 hours.   Geannie RisenGreg Jessalyn Hinojosa, PharmD, BCPS   3/7/20165:17 AM

## 2014-06-05 NOTE — Progress Notes (Signed)
VAD evaluation consent reviewed and signed by patient and wife.  Continued VAD teaching with pt, wife, and daughter. Patient and caregivers asked questions and had good interaction with VAD coordinator; all questions answered regarding VAD implant, hospital stay, and what to expect when discharged home. Pt identified his wife as primary caregiver. Explained need for 24/7 care when pt discharged home;  both pt and caregivers verbalized understanding of above.   Explained that LVAD can be implanted for two indications: 1. Bridge to transplant - used for patients who cannot safely wait for heart transplant.  2. Destination therapy - used for patients until end of life or recovery of heart function.  Discussed labs, tests, and consults that will be needed to complete VAD evaluation. Will ask VAD patient and caregiver to meet with patient and family while he is inpatient. All are in agreement to above.

## 2014-06-05 NOTE — Progress Notes (Signed)
Patient ID: Luke Pittman, male   DOB: 1945-11-01, 69 y.o.   MRN: [id number]   SUBJECTIVE:   Yesterday milrinone was increased to 0.375 mcg and lasix was increased to 15 mg per hour. CO-OX today 56%. Weight down 5 pounds.  Creatinine 2.1.  He remains in atrial flutter but HR in 80-90s on amiodarone gtt.    Denies SOB.   ECHO EF 20% mild LVH  RV mod-severely dilated with moderate-severe systolic dysfunction .   RHC: 06/01/2014.  RA mean 22 RV 57/24 PA 61/29, mean 43 PCWP mean 29 Oxygen saturations: PA 34% AO 95% Cardiac Output (Fick) 3.24  Cardiac Index (Fick) 1.64 PVR 4.3 WU Cardiac Output (Thermo) 3.23 Cardiac Index (Thermo) 1.63  Filed Vitals:   06/04/14 1925 06/04/14 2317 06/05/14 0300 06/05/14 0348  BP: 108/55 85/45 90/46    Pulse:      Temp: 97.9 F (36.6 C) 97.7 F (36.5 C) 98 F (36.7 C)   TempSrc: Oral Oral Oral   Resp: 20 16 18    Height:      Weight:    168 lb 10.4 oz (76.5 kg)  SpO2: 94% 93% 94%     Intake/Output Summary (Last 24 hours) at 06/05/14 0710 Last data filed at 06/05/14 0600  Gross per 24 hour  Intake 2004.99 ml  Output   3150 ml  Net -1145.01 ml    LABS: Basic Metabolic Panel:  Recent Labs  95/28/4101/08/13 0640  06/04/14 0354 06/05/14 0345  NA 125*  < > 125* 127*  K 6.0*  < > 4.4 3.9  CL 89*  < > 91* 89*  CO2 24  < > 28 28  GLUCOSE 171*  < > 120* 78  BUN 29*  < > 36* 42*  CREATININE 1.78*  < > 2.17* 2.12*  CALCIUM 8.5  < > 8.3* 8.6  MG 2.2  --   --   --   < > = values in this interval not displayed. Liver Function Tests:  Recent Labs  06/04/14 0354 06/05/14 0345  AST 32 31  ALT 79* 69*  ALKPHOS 110 111  BILITOT 1.3* 1.3*  PROT 5.8* 5.9*  ALBUMIN 3.2* 3.1*   No results for input(s): LIPASE, AMYLASE in the last 72 hours. CBC:  Recent Labs  06/04/14 0354 06/05/14 0345  WBC 5.9 6.1  HGB 8.8* 8.5*  HCT 27.5* 26.1*  MCV 71.4* 70.2*  PLT 165 145*   Cardiac Enzymes: No results for input(s): CKTOTAL, CKMB, CKMBINDEX,  TROPONINI in the last 72 hours. BNP: Invalid input(s): POCBNP D-Dimer: No results for input(s): DDIMER in the last 72 hours. Hemoglobin A1C: No results for input(s): HGBA1C in the last 72 hours. Fasting Lipid Panel: No results for input(s): CHOL, HDL, LDLCALC, TRIG, CHOLHDL, LDLDIRECT in the last 72 hours. Thyroid Function Tests: No results for input(s): TSH, T4TOTAL, T3FREE, THYROIDAB in the last 72 hours.  Invalid input(s): FREET3 Anemia Panel: No results for input(s): VITAMINB12, FOLATE, FERRITIN, TIBC, IRON, RETICCTPCT in the last 72 hours.  RADIOLOGY: Dg Chest 2 View  05/27/2014   CLINICAL DATA:  Congestive failure  EXAM: CHEST  2 VIEW  COMPARISON:  10/03/2013  FINDINGS: Cardiac shadow remains enlarged. Postsurgical changes are again seen. No vascular congestion is seen. Very minimal effusions are noted bilaterally. No focal infiltrate is noted. No bony abnormality is seen.  IMPRESSION: Small effusions.  No other significant abnormality is noted.   Electronically Signed   By: Alcide CleverMark  Lukens M.D.   On: 05/19/2014 13:44  Ct Chest Wo Contrast  06/04/2014   CLINICAL DATA:  Shortness of breath, history of heart failure  EXAM: CT CHEST WITHOUT CONTRAST  TECHNIQUE: Multidetector CT imaging of the chest was performed following the standard protocol without IV contrast.  COMPARISON:  05/31/2014  FINDINGS: Mild peribronchial wall thickening. Mild interstitial change with a few Kerley B-lines bilaterally. No alveolar opacification. Small bilateral pleural effusions larger on the left. No significant pericardial effusion.  Severe cardiac enlargement and coronary artery calcification. Mild thoracic aortic calcification.  Right PICC line tip extends to the cavoatrial junction.  Images through the upper abdomen show a small volume of ascites.  No acute musculoskeletal findings. No significant hilar or mediastinal adenopathy.  IMPRESSION: Findings consistent with congestive heart failure with mild  interstitial pulmonary edema.   Electronically Signed   By: Esperanza Heir M.D.   On: 06/04/2014 16:08   US Abdomen Complete  06/26/14   CLINICAL DATA:  Increased liver function test. History of diabetes, chronic kidney disease and patient is taking Cymbalta  EXAM: ULTRASOUND ABDOMEN COMPLETE  COMPARISON:  None.  FINDINGS: Gallbladder: Cholelithiasis without pericholecystic fluid. Gallbladder wall wall thickening measuring 5.6 mm. Negative sonographic Murphy sign.  Common bile duct: Diameter: 2.5 mm  Liver: No focal lesion identified. Within normal limits in parenchymal echogenicity.  IVC: No abnormality visualized.  Pancreas: Limited visualization secondary to overlying bowel gas.  Spleen: Size and appearance within normal limits.  Right Kidney: Length: 11.5 cm. Echogenicity within normal limits. No mass or hydronephrosis visualized.  Left Kidney: Length: 11.6 cm. Echogenicity within normal limits. No mass or hydronephrosis visualized.  Abdominal aorta: No aneurysm visualized.  Other findings: There is a small amount of ascites. There are bilateral pleural effusions.  IMPRESSION: 1. Cholelithiasis. Gallbladder wall thickening which may be secondary to intrinsic gallbladder wall abnormality suggest cholecystitis, but can also be seen in the setting of ascites and hepatocellular disease versus hypoalbuminemia versus fluid overload. The lateral etiologies are favored over cholecystitis. 2. Small amount of ascites. 3. Bilateral pleural effusions.   Electronically Signed   By: Elige Ko   On: 06/26/14 14:56   Dg Chest Port 1 View  05/31/2014   CLINICAL DATA:  PICC line placement  EXAM: PORTABLE CHEST - 1 VIEW  COMPARISON:  June 26, 2014  FINDINGS: Right-sided PICC line with the tip projecting over the cavoatrial junction. No focal consolidation or pneumothorax. Trace left pleural effusion. There is stable cardiomegaly. There is evidence of prior CABG.  Mild osteoarthritis of bilateral glenohumeral joints.   IMPRESSION: Right-sided PICC line with the tip projecting over the cavoatrial junction.   Electronically Signed   By: Elige Ko   On: 05/31/2014 16:43    PHYSICAL EXAM CVP 18 General: NAD Sitting on the side of the bed.  Neck: JVP to jaw , no thyromegaly or thyroid nodule.  Lungs: Decreased breath sounds at bases.  CV: Nondisplaced PMI.  Heart mildly tachy, regular S1/S2, no S3/S4, no murmur.  R and LLE 1-2+ edema.  Abdomen: Soft, nontender, no hepatosplenomegaly, mildly distended  Psych: Normal affect. Extremities: No clubbing or cyanosis.    TELEMETRY: Reviewed telemetry pt in atrial flutter in 80s.   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  1. Acute on chronic systolic CHF: EF 16% with at least moderate RV dysfunction by echo. He does not have an ICD (has not wanted in past).  He presented with NYHA class IIIb symptoms (dyspnea and profound fatigue),  poor appetite, and markedly elevated transaminases. He remains volume overloaded on exam. BP is stable. RHC showed elevated filling pressures and low output.  On RHC, RA/PCWP = 0.75.  Milrinone gtt was begun 3/2 and he felt much better/more alert.  Milrinone turned down to 0.125 on 3/3 with fall in co-ox.  Milrinone back up to 0.25, co-ox 61% with CVP 22.  Yesterday milrinone was increased to 0.375 mcg and today's CO-OX is 56%.  No BB for now with low output.  - Off spironolactone and lisinopril with hyperkalemia/elevated creatinine.  Also stopped digoxin for now.  - CVP down a little from 22 > 18. Weight down 5 pounds. Continue milrinone to 0.375 today CO-OX 56%. Continue lasix drip at  15 mg/hr + metolazone 2.5 x 1.    -  He has seen LVAD coordinator.  He will be a difficult candidate for LVAD due to RV dysfunction, but he will also be a difficult candidate for home inotropes given arrhythmias. Will need to discuss further with team on Monday.  Dr Donata Clay evaluated and  requests repeat RHC on Milrinone. Will schedule.   2. AKI: Cardiorenal syndrome.  Some improvement in creatinine initially with diuresis but creatinine back up.  Today creatinine is unchanged 2.1>2.1  Continue milrinone to 0.375 mcg.    3. CAD: S/p CABG. Cath in 2013 with patent grafts. No chest pain. Continue ASA, LFTs back down so restarted statin.   4. Elevated transaminases: Suspect hepatic congestion from CHF. He is clearly volume overloaded. LFTs coming down with diuresis, abdominal US showed normal liver echotexture. 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   5. Diabetes: added nateglinide per diabetes coordinator.  Not good candidate for metformin in the future with CKD.  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  7. Atrial fibrillation: Appears to have gone into atrial fibrillation => flutter this admission on milrinone.  He is on heparin gtt and amiodarone gtt, HR 80s.  Now on warfarin.  Will need to decide on DCCV => not sure he will maintain NSR if we are unable to wean off milrinone.  8. Hyponatremia: Fluid restrict, 1500 cc.  If creatinine remains stable and sodium stays low, may add tolvaptan.   CLEGG,AMY   06/05/2014 7:10 AM   Patient seen with NP, agree with the above note.  He diuresed better yesterday, weight and CVP down.  Still quite volume overloaded.  Milrinone up to 0.375 but co-ox remains marginal.  Starting LVAD consideration, seen by Dr Donata Clay yesterday.   Plan repeat RHC when diuresed.   Marca Ancona 06/05/2014 7:38 AM

## 2014-06-06 DIAGNOSIS — I5023 Acute on chronic systolic (congestive) heart failure: Secondary | ICD-10-CM

## 2014-06-06 DIAGNOSIS — R531 Weakness: Secondary | ICD-10-CM

## 2014-06-06 DIAGNOSIS — Z515 Encounter for palliative care: Secondary | ICD-10-CM

## 2014-06-06 LAB — BASIC METABOLIC PANEL
ANION GAP: 14 (ref 5–15)
BUN: 43 mg/dL — AB (ref 6–23)
CHLORIDE: 85 mmol/L — AB (ref 96–112)
CO2: 27 mmol/L (ref 19–32)
Calcium: 8.7 mg/dL (ref 8.4–10.5)
Creatinine, Ser: 2.03 mg/dL — ABNORMAL HIGH (ref 0.50–1.35)
GFR calc non Af Amer: 32 mL/min — ABNORMAL LOW (ref >90)
GFR, EST AFRICAN AMERICAN: 37 mL/min — AB (ref >90)
Glucose, Bld: 145 mg/dL — ABNORMAL HIGH (ref 70–99)
POTASSIUM: 3.6 mmol/L (ref 3.5–5.1)
SODIUM: 126 mmol/L — AB (ref 135–145)

## 2014-06-06 LAB — CBC
HCT: 26.8 % — ABNORMAL LOW (ref 39.0–52.0)
Hemoglobin: 8.8 g/dL — ABNORMAL LOW (ref 13.0–17.0)
MCH: 23.3 pg — ABNORMAL LOW (ref 26.0–34.0)
MCHC: 32.8 g/dL (ref 30.0–36.0)
MCV: 71.1 fL — AB (ref 78.0–100.0)
PLATELETS: 122 10*3/uL — AB (ref 150–400)
RBC: 3.77 MIL/uL — ABNORMAL LOW (ref 4.22–5.81)
RDW: 17.4 % — AB (ref 11.5–15.5)
WBC: 6.4 10*3/uL (ref 4.0–10.5)

## 2014-06-06 LAB — CARBOXYHEMOGLOBIN
Carboxyhemoglobin: 1.5 % (ref 0.5–1.5)
Methemoglobin: 0.7 % (ref 0.0–1.5)
O2 Saturation: 57 %
TOTAL HEMOGLOBIN: 9.8 g/dL — AB (ref 13.5–18.0)

## 2014-06-06 LAB — PROTIME-INR
INR: 1.78 — AB (ref 0.00–1.49)
Prothrombin Time: 20.8 seconds — ABNORMAL HIGH (ref 11.6–15.2)

## 2014-06-06 LAB — LIPID PANEL
CHOLESTEROL: 78 mg/dL (ref 0–200)
HDL: 37 mg/dL — AB (ref >39)
LDL Cholesterol: 35 mg/dL (ref 0–99)
TRIGLYCERIDES: 32 mg/dL (ref <150)
Total CHOL/HDL Ratio: 2.1 RATIO
VLDL: 6 mg/dL (ref 0–40)

## 2014-06-06 LAB — PREALBUMIN: Prealbumin: 12.2 mg/dL — ABNORMAL LOW (ref 17.0–34.0)

## 2014-06-06 LAB — CEA: CEA: 2.5 ng/mL (ref 0.0–4.7)

## 2014-06-06 LAB — HEPATITIS B CORE ANTIBODY, IGM: HEP B C IGM: NONREACTIVE

## 2014-06-06 LAB — GLUCOSE, CAPILLARY
GLUCOSE-CAPILLARY: 140 mg/dL — AB (ref 70–99)
Glucose-Capillary: 74 mg/dL (ref 70–99)
Glucose-Capillary: 198 mg/dL — ABNORMAL HIGH (ref 70–99)
Glucose-Capillary: 161 mg/dL — ABNORMAL HIGH (ref 70–99)

## 2014-06-06 LAB — HEMOGLOBIN A1C
Hgb A1c MFr Bld: 8.4 % — ABNORMAL HIGH (ref 4.8–5.6)
Mean Plasma Glucose: 194 mg/dL

## 2014-06-06 LAB — T4, FREE: Free T4: 1.25 ng/dL (ref 0.80–1.80)

## 2014-06-06 LAB — HIV ANTIBODY (ROUTINE TESTING W REFLEX): HIV SCREEN 4TH GENERATION: NONREACTIVE

## 2014-06-06 LAB — HEPARIN LEVEL (UNFRACTIONATED): HEPARIN UNFRACTIONATED: 0.43 [IU]/mL (ref 0.30–0.70)

## 2014-06-06 LAB — HEPATITIS B SURFACE ANTIBODY,QUALITATIVE: Hep B S Ab: REACTIVE

## 2014-06-06 LAB — HEPATITIS B SURFACE ANTIGEN: HEP B S AG: NEGATIVE

## 2014-06-06 MED ORDER — POTASSIUM CHLORIDE CRYS ER 20 MEQ PO TBCR
40.0000 meq | EXTENDED_RELEASE_TABLET | Freq: Once | ORAL | Status: AC
  Administered 2014-06-06: 40 meq via ORAL
  Filled 2014-06-06: qty 2

## 2014-06-06 MED ORDER — METOLAZONE 2.5 MG PO TABS
2.5000 mg | ORAL_TABLET | Freq: Two times a day (BID) | ORAL | Status: AC
  Administered 2014-06-06 (×2): 2.5 mg via ORAL
  Filled 2014-06-06 (×3): qty 1

## 2014-06-06 NOTE — Progress Notes (Signed)
ANTICOAGULATION CONSULT NOTE - Follow Up Consult  Pharmacy Consult for Heparin Indication: atrial fibrillation  Allergies  Allergen Reactions  . Beta Adrenergic Blockers Other (See Comments)    Swollen hands, Per patient currently ([id number]) tolerating Coreg.  . Fish Oil Diarrhea  . Januvia [Sitagliptin Phosphate] Other (See Comments)    Leg cramps  . Tetanus-Diphtheria Toxoids Td Swelling    Patient Measurements: Height: 5\' 11"  (180.3 cm) Weight: 165 lb 9.1 oz (75.1 kg) IBW/kg (Calculated) : 75.3  Vital Signs: Temp: 97.5 F (36.4 C) (03/08 0420) Temp Source: Oral (03/08 0420) BP: 121/67 mmHg (03/08 0420) Pulse Rate: 83 (03/07 2000)  Labs:  Recent Labs  06/03/14 1400  06/04/14 0354 06/05/14 0345 06/05/14 1450 06/06/14 0400 06/06/14 0500  HGB  --   < > 8.8* 8.5*  --  8.8*  --   HCT  --   --  27.5* 26.1*  --  26.8*  --   PLT  --   --  165 145*  --  122*  --   LABPROT  --   --  17.0* 17.2*  --   --  20.8*  INR  --   --  1.37 1.39  --   --  1.78*  HEPARINUNFRC  --   < > 0.52 0.22* 0.38  --  0.43  CREATININE 1.99*  --  2.17* 2.12*  --   --   --   < > = values in this interval not displayed.  Estimated Creatinine Clearance: 35.4 mL/min (by C-G formula based on Cr of 2.12).   Assessment: 69yo male with acute systolic HF on milrinone and also with afib on heparin at 1250 units/hr.   Heparin level remains at goal (HL= 0.43).   No bleeding noted, H&H stable today after transfusion yesterday, PLT dropped 145>122, will continue to monitor.   Patient began warfarin on 3/4, but is being worked up for possible LVAD and needs EGD and colonoscopy.  Warfarin held starting 3/7 until w/u complete.  Goal of Therapy:  Heparin level 0.3-0.7 units/ml Monitor platelets by anticoagulation protocol: Yes   Plan:  - Continue Heparin at 1250 units/hr  - Continue to hold warfarin - Daily HL, CBC, s/sx bleeding  Waynette Butteryegan K. Mayer Vondrak, PharmD Clinical Pharmacy Resident Pager:  [phone] 06/06/2014 6:56 AM

## 2014-06-06 NOTE — Progress Notes (Signed)
Patient ID: Luke Pittman, male   DOB: [birth year], 69 y.o.   MRN: [id number]   SUBJECTIVE:   Milrinone gtt continues at 0.375.  He is down another 3 lbs.  CVP still 18.     Denies SOB.   ECHO EF 20% mild LVH  RV mod-severely dilated with moderate-severe systolic dysfunction .   RHC: 06/20/2014.  RA mean 22 RV 57/24 PA 61/29, mean 43 PCWP mean 29 Oxygen saturations: PA 34% AO 95% Cardiac Output (Fick) 3.24  Cardiac Index (Fick) 1.64 PVR 4.3 WU Cardiac Output (Thermo) 3.23 Cardiac Index (Thermo) 1.63  Filed Vitals:   06/05/14 2000 06/06/14 0019 06/06/14 0420 06/06/14 0458  BP: 98/59 104/62 121/67   Pulse: 83     Temp: 97.4 F (36.3 C) 97.2 F (36.2 C) 97.5 F (36.4 C)   TempSrc: Oral Oral Oral   Resp:      Height:      Weight:    165 lb 9.1 oz (75.1 kg)  SpO2: 96% 95% 96%     Intake/Output Summary (Last 24 hours) at 06/06/14 0735 Last data filed at 06/06/14 0600  Gross per 24 hour  Intake   2784 ml  Output   3400 ml  Net   -616 ml    LABS: Basic Metabolic Panel:  Recent Labs  40/98/1101/09/13 0354 06/05/14 0345  NA 125* 127*  K 4.4 3.9  CL 91* 89*  CO2 28 28  GLUCOSE 120* 78  BUN 36* 42*  CREATININE 2.17* 2.12*  CALCIUM 8.3* 8.6   Liver Function Tests:  Recent Labs  06/04/14 0354 06/05/14 0345  AST 32 31  ALT 79* 69*  ALKPHOS 110 111  BILITOT 1.3* 1.3*  PROT 5.8* 5.9*  ALBUMIN 3.2* 3.1*   No results for input(s): LIPASE, AMYLASE in the last 72 hours. CBC:  Recent Labs  06/05/14 0345 06/06/14 0400  WBC 6.1 6.4  HGB 8.5* 8.8*  HCT 26.1* 26.8*  MCV 70.2* 71.1*  PLT 145* 122*   Cardiac Enzymes: No results for input(s): CKTOTAL, CKMB, CKMBINDEX, TROPONINI in the last 72 hours. BNP: Invalid input(s): POCBNP D-Dimer: No results for input(s): DDIMER in the last 72 hours. Hemoglobin A1C:  Recent Labs  06/05/14 1045  HGBA1C 8.4*   Fasting Lipid Panel:  Recent Labs  06/06/14 0400  CHOL 78  HDL 37*  LDLCALC 35  TRIG 32  CHOLHDL  2.1   Thyroid Function Tests:  Recent Labs  06/05/14 0345  TSH 7.212*   Anemia Panel:  Recent Labs  06/05/14 0815  VITAMINB12 655  FOLATE 13.4  FERRITIN 76  TIBC 458*  IRON 18*  RETICCTPCT 1.3    RADIOLOGY: Ct Abdomen Pelvis Wo Contrast  06/05/2014   CLINICAL DATA:  69 year old with cardiomyopathy. Preop evaluation prior to placement of left ventricular assist device.  EXAM: CT ABDOMEN AND PELVIS WITHOUT CONTRAST  TECHNIQUE: Multidetector CT imaging of the abdomen and pelvis was performed following the standard protocol without IV contrast.  COMPARISON:  Chest CT 06/04/2014  FINDINGS: Again noted is a small left pleural effusion. There is cardiomegaly without significant pericardial fluid. No evidence for free intraperitoneal air.  Unenhanced CT was performed per clinician order. Lack of IV contrast limits sensitivity and specificity, especially for evaluation of abdominal/pelvic solid viscera.  There is subcutaneous edema. There is a moderate amount of ascites around the liver and within the pelvis. Small amount of ascites around the spleen. No gross abnormality to the liver, gallbladder or spleen. Spleen size  is normal. Normal appearance of the pancreas, stomach and adrenal glands. Normal appearance of the kidneys, urinary bladder and prostate.  Atherosclerotic calcifications in the abdominal aorta without aneurysm. No significant lymphadenopathy involving the abdomen or pelvis. Normal appearance of the small bowel, large bowel and appendix. Mild disc space loss at L5-S1. Mild facet arthropathy in the lower lumbar spine.  IMPRESSION: Small-to-moderate amount of ascites. Subcutaneous edema and a small left pleural effusion.   Electronically Signed   By: Richarda Overlie M.D.   On: 06/05/2014 12:42   Dg Orthopantogram  06/05/2014   CLINICAL DATA:  Preoperative evaluation  EXAM: ORTHOPANTOGRAM/PANORAMIC  COMPARISON:  None  FINDINGS: Dental appliances present.  Multiple prior dental RIGHT  extractions with dental amalgam at multiple remaining teeth.  No mandibular fracture or bone destruction identified.  IMPRESSION: No acute mandibular abnormalities.   Electronically Signed   By: Ulyses Southward M.D.   On: 06/05/2014 12:10   Dg Chest 2 View  05/04/2014   CLINICAL DATA:  Congestive failure  EXAM: CHEST  2 VIEW  COMPARISON:  10/03/2013  FINDINGS: Cardiac shadow remains enlarged. Postsurgical changes are again seen. No vascular congestion is seen. Very minimal effusions are noted bilaterally. No focal infiltrate is noted. No bony abnormality is seen.  IMPRESSION: Small effusions.  No other significant abnormality is noted.   Electronically Signed   By: Alcide Clever M.D.   On: 05/17/2014 13:44   Ct Chest Wo Contrast  06/04/2014   CLINICAL DATA:  Shortness of breath, history of heart failure  EXAM: CT CHEST WITHOUT CONTRAST  TECHNIQUE: Multidetector CT imaging of the chest was performed following the standard protocol without IV contrast.  COMPARISON:  05/31/2014  FINDINGS: Mild peribronchial wall thickening. Mild interstitial change with a few Kerley B-lines bilaterally. No alveolar opacification. Small bilateral pleural effusions larger on the left. No significant pericardial effusion.  Severe cardiac enlargement and coronary artery calcification. Mild thoracic aortic calcification.  Right PICC line tip extends to the cavoatrial junction.  Images through the upper abdomen show a small volume of ascites.  No acute musculoskeletal findings. No significant hilar or mediastinal adenopathy.  IMPRESSION: Findings consistent with congestive heart failure with mild interstitial pulmonary edema.   Electronically Signed   By: Esperanza Heir M.D.   On: 06/04/2014 16:08   US Abdomen Complete  05/22/2014   CLINICAL DATA:  Increased liver function test. History of diabetes, chronic kidney disease and patient is taking Cymbalta  EXAM: ULTRASOUND ABDOMEN COMPLETE  COMPARISON:  None.  FINDINGS: Gallbladder:  Cholelithiasis without pericholecystic fluid. Gallbladder wall wall thickening measuring 5.6 mm. Negative sonographic Murphy sign.  Common bile duct: Diameter: 2.5 mm  Liver: No focal lesion identified. Within normal limits in parenchymal echogenicity.  IVC: No abnormality visualized.  Pancreas: Limited visualization secondary to overlying bowel gas.  Spleen: Size and appearance within normal limits.  Right Kidney: Length: 11.5 cm. Echogenicity within normal limits. No mass or hydronephrosis visualized.  Left Kidney: Length: 11.6 cm. Echogenicity within normal limits. No mass or hydronephrosis visualized.  Abdominal aorta: No aneurysm visualized.  Other findings: There is a small amount of ascites. There are bilateral pleural effusions.  IMPRESSION: 1. Cholelithiasis. Gallbladder wall thickening which may be secondary to intrinsic gallbladder wall abnormality suggest cholecystitis, but can also be seen in the setting of ascites and hepatocellular disease versus hypoalbuminemia versus fluid overload. The lateral etiologies are favored over cholecystitis. 2. Small amount of ascites. 3. Bilateral pleural effusions.   Electronically Signed  By: Elige Ko   On: 05/20/2014 14:56   Dg Chest Port 1 View  05/31/2014   CLINICAL DATA:  PICC line placement  EXAM: PORTABLE CHEST - 1 VIEW  COMPARISON:  05/06/2014  FINDINGS: Right-sided PICC line with the tip projecting over the cavoatrial junction. No focal consolidation or pneumothorax. Trace left pleural effusion. There is stable cardiomegaly. There is evidence of prior CABG.  Mild osteoarthritis of bilateral glenohumeral joints.  IMPRESSION: Right-sided PICC line with the tip projecting over the cavoatrial junction.   Electronically Signed   By: Elige Ko   On: 05/31/2014 16:43    PHYSICAL EXAM CVP 18 General: NAD Sitting on the side of the bed.  Neck: JVP 14, no thyromegaly or thyroid nodule.  Lungs: Decreased breath sounds at bases.  CV: Nondisplaced PMI.   Heart mildly tachy, regular S1/S2, no S3/S4, no murmur.  1+ edema 1/2 to knees bilaterally.  Abdomen: Soft, nontender, no hepatosplenomegaly, mildly distended  Psych: Normal affect. Extremities: No clubbing or cyanosis.    TELEMETRY: Reviewed telemetry pt in atrial flutter in 80s.   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  1. Acute on chronic systolic CHF: EF 16% with at least moderate RV dysfunction by echo. He does not have an ICD (has not wanted in past).  He presented with NYHA class IIIb symptoms (dyspnea and profound fatigue), poor appetite, and markedly elevated transaminases. He remains volume overloaded on exam. BP is stable. RHC showed elevated filling pressures and low output.  On RHC, RA/PCWP = 0.75.  Milrinone gtt was begun 3/2 and he felt much better/more alert.  Milrinone turned down to 0.125 on 3/3 with fall in co-ox.  Milrinone back up to 0.25, co-ox 61% with CVP 22.  3/6 milrinone was increased to 0.375 mcg.  He remains volume overloaded with CVP 18.  Weight down another 3 lbs.  - No beta blocker other than amiodarone given low output.   - Off spironolactone and lisinopril with hyperkalemia/elevated creatinine.  Also stopped digoxin for now.  - Continue milrinone 0.375 today, send co-ox. Continue lasix drip at  15 mg/hr + metolazone 2.5 mg bid.    -  He has seen LVAD coordinator.  He will be a difficult candidate for LVAD due to RV dysfunction, but he will also be a difficult candidate for home inotropes given arrhythmias. ?Milrinone as bridge to transplant.  If he goes home on milrinone, will need Lifevest.  Plan RHC when better diuresed, later this week.  2. AKI: Cardiorenal syndrome.  Some improvement in creatinine initially with diuresis but creatinine back up.  Await today's BMET.  Continue milrinone to 0.375 mcg.    3. CAD: S/p CABG. Cath in 2013 with patent grafts. No chest pain. Continue ASA, LFTs back  down so restarted statin.   4. Elevated transaminases: Suspect hepatic congestion from CHF. He is clearly volume overloaded. LFTs coming down with diuresis, abdominal US showed normal liver echotexture. 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   5. Diabetes: added nateglinide per diabetes coordinator.  Not good candidate for metformin in the future with CKD.  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  7. Atrial fibrillation: Appears to have gone into atrial fibrillation => flutter this admission on milrinone.  He is on heparin gtt and amiodarone gtt, HR 80s. Not on warfarin or NOAC yet given potential procedures.  Will need to decide on DCCV => not sure he  will maintain NSR if we are unable to wean off milrinone.  8. Hyponatremia: Fluid restrict, 1500 cc.  If creatinine remains stable and sodium stays low, may add tolvaptan.   Marca Ancona   06/06/2014 7:35 AM

## 2014-06-06 NOTE — Progress Notes (Signed)
CARDIAC REHAB PHASE I   PRE:  Rate/Rhythm: 90 aflutter    BP: sitting 109/54    SaO2:   MODE:  Ambulation: 1050 ft   POST:  Rate/Rhythm: 107 aflutter    BP: sitting 103/52     SaO2:   Tolerated very well. Pt ambulated 1000 ft in 6 min.  No c/o, not SOB.  [id number]  Elissa LovettReeve, Darlen Gledhill Citrus HillsKristan CES, ACSM 06/06/2014 1:01 PM

## 2014-06-06 NOTE — Consult Note (Signed)
Patient Luke Pittman      DOB: June 13, 1945      TTS:[id number]     Consult Note from the Palliative Medicine Team at [facility] Requested by: Dr. Aundra Dubin     PCP: Redge Gainer, MD Reason for Consultation: [place]     Phone Number:[phone]  Assessment of patients Current state: I met today with Luke Pittman, his wife, and daughter at bedside. We discussed the basics in risks/benefits and VAD recovery time. We discussed natural disease progression of his heart disease and they have good understanding and do want the VAD if he is found to be a good candidate. We discussed quality of life and advance directives. He does not have a Living Will but does say that if VAD is not an option he would likely not want resuscitation. He would not want prolonged life support. This is the first time they have discussed issues regarding end of life and his wife is very quiet as this seems very difficult for her. I tried to normalize the conversation in saying this is important to all of [place]. Luke Pittman brings up the point that his wishes may change depending on the situation and what his options are. However, their daughter encourages him to consider completing Advance Directive as Luke Pittman would not be able to make a decision, if necessary, "to let him go." They have plenty to think about and I will continue to follow.    Goals of Care: 1.  Code Status: FULL  2. Disposition: Home when stable with home health.    3. Symptom Management:   1. Neuropathy in feet: May consider capsaicin 0.075% QID.  2. Weakness/fatigue: Continue management for heart failure team. This has improved on milrinone along with increased appetite and intake. 3. Right ear fullness/worsening hearing loss: Recent treatment for fungal infection. ENT to follow up.    4. Psychosocial: Emotional support provided to patient and family at bedside during difficult conversation.    Brief HPI: 69 year old Caucasian male with PMH of CAD  s/p CABG in 1994, history of ischemic cardiomyopathy, chronic biventricular heart failure with EF 20-25% on Echo in 2013, HLD, DM and stage III CKD directed admitted for elevated transaminase and concern for R heart failure and low output failure vs recent Cymbalta use. PMH reviewed. VAD evaluation in process.    ROS: + weakness, fatigue, right ear fullness, neuropathy in feet    PMH:  Past Medical History  Diagnosis Date  . CAD (coronary artery disease)     a. s/p CABG 1994;  b.  Patent grafts 07/2011  . Chronic systolic CHF (congestive heart failure)     a. EF 25% in 2005;  b. Echo 07/2011: EF 20-25%  . Anemia   . Hyperlipidemia   . Hyperkalemia     a. 07/2011 ->acei d/c'd.  . Ischemic cardiomyopathy   . Type II diabetes mellitus   . Stroke 01/05/2013    "possibility; lost hearing in left ear; not sure it wasn't caused by fluid pill"  . 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 Cardiac catheterization  1994; ~ 07/2011  . Coronary artery bypass graft  1994    "CABG X4", LIMA to the LAD, sequential SVG to first and second obtuse marginal, sequential SVG to the right coronary artery.  . Left heart catheterization with coronary/graft angiogram N/A 08/05/2011    Procedure: LEFT HEART CATHETERIZATION WITH CORONARY/GRAFT  ANGIOGRAM;  Surgeon: Larey Dresser, MD;  Location: [facility] CATH LAB;  Service: Cardiovascular;  Laterality: N/A;  . Right heart catheterization N/A 06/08/2014    Procedure: RIGHT HEART CATH;  Surgeon: Larey Dresser, MD;  Location: [facility] CATH LAB;  Service: Cardiovascular;  Laterality: N/A;   I have reviewed the FH and SH and  If appropriate update it with new information. Allergies  Allergen Reactions  . Beta Adrenergic Blockers Other (See Comments)    Swollen hands, Per patient currently ([id number]) tolerating Coreg.  . Fish Oil Diarrhea  . Januvia [Sitagliptin Phosphate] Other (See Comments)    Leg cramps  .  Tetanus-Diphtheria Toxoids Td Swelling   Scheduled Meds: . aspirin  81 mg Oral Daily  . ezetimibe  10 mg Oral Daily  . insulin aspart  0-15 Units Subcutaneous TID WC  . insulin aspart  0-5 Units Subcutaneous QHS  . insulin detemir  10 Units Subcutaneous QHS  . lactulose  20 g Oral Daily  . Mega Benfotiamine Antioxidant Capsule  1 capsule Oral Daily  . metolazone  2.5 mg Oral BID  . nateglinide  60 mg Oral TID WC  . NEOMYCIN-POLYMYXIN-HYDROCORTISONE  3 drop Right Ear QID  . pantoprazole  40 mg Oral Q1200  . pravastatin  80 mg Oral q1800  . silver sulfADIAZINE   Topical BID  . warfarin   Does not apply Once   Continuous Infusions: . sodium chloride Stopped (06/01/14 2000)  . amiodarone 30 mg/hr (06/06/14 1041)  . furosemide (LASIX) infusion 15 mg/hr (06/05/14 2000)  . heparin 1,250 Units/hr (06/05/14 2000)  . milrinone 0.375 mcg/kg/min (06/06/14 1041)   PRN Meds:.acetaminophen, fluticasone, HYDROcodone-acetaminophen, MUSCLE RUB, ondansetron (ZOFRAN) IV, sodium chloride    BP 101/51 mmHg  Pulse 85  Temp(Src) 97.5 F (36.4 C) (Oral)  Resp 16  Ht _0  (1.803 m)  Wt 75.1 kg (165 lb 9.1 oz)  BMI 23.10 kg/m2  SpO2 95%   PPS: 30%   Intake/Output Summary (Last 24 hours) at 06/06/14 1045 Last data filed at 06/06/14 0900  Gross per 24 hour  Intake   2904 ml  Output   3460 ml  Net   -556 ml   LBM: 3/8  Physical Exam:  General: NAD, sitting up in bed, pleasant HEENT: [place]/AT, +JVD, hard of hearing Chest: No labored breathing, symmetric, room air CVS: Irreg Abdomen: Soft, NT, ND Ext: MAE, BLE 1+ edema Neuro: Awake, alert, oriented x 3  Labs: CBC    Component Value Date/Time   WBC 6.4 06/06/2014 0400   WBC 7.4 04/27/2014 0944   WBC 6.9 12/13/2013 0941   RBC 3.77* 06/06/2014 0400   RBC 3.93* 06/05/2014 0815   RBC 4.10* 04/27/2014 0944   RBC 4.5* 12/13/2013 0941   HGB 8.8* 06/06/2014 0400   HGB 11.1* 12/13/2013 0941   HCT 26.8* 06/06/2014 0400   HCT 35.4*  12/13/2013 0941   PLT 122* 06/06/2014 0400   MCV 71.1* 06/06/2014 0400   MCV 78.5* 12/13/2013 0941   MCH 23.3* 06/06/2014 0400   MCH 24.4* 04/27/2014 0944   MCH 24.6* 12/13/2013 0941   MCHC 32.8 06/06/2014 0400   MCHC 31.2* 04/27/2014 0944   MCHC 31.4* 12/13/2013 0941   RDW 17.4* 06/06/2014 0400   RDW 16.4* 04/27/2014 0944   LYMPHSABS 1.1 05/07/2014 1606   LYMPHSABS 1.5 04/27/2014 0944   MONOABS 0.6 05/10/2014 1606   EOSABS 0.1 05/24/2014 1606   EOSABS 0.2 04/27/2014 0944   BASOSABS 0.0 05/08/2014 1606  BASOSABS 0.0 04/27/2014 0944    BMET    Component Value Date/Time   NA 127* 06/05/2014 0345   NA 133* 05/24/2014 1245   K 3.9 06/05/2014 0345   CL 89* 06/05/2014 0345   CO2 28 06/05/2014 0345   GLUCOSE 78 06/05/2014 0345   GLUCOSE 141* 05/24/2014 1245   BUN 42* 06/05/2014 0345   BUN 34* 05/24/2014 1245   CREATININE 2.12* 06/05/2014 0345   CREATININE 1.77* 04/11/2014 0830   CALCIUM 8.6 06/05/2014 0345   GFRNONAA 30* 06/05/2014 0345   GFRNONAA 64 10/20/2012 0852   GFRAA 35* 06/05/2014 0345   GFRAA 74 10/20/2012 0852    CMP     Component Value Date/Time   NA 127* 06/05/2014 0345   NA 133* 05/24/2014 1245   K 3.9 06/05/2014 0345   CL 89* 06/05/2014 0345   CO2 28 06/05/2014 0345   GLUCOSE 78 06/05/2014 0345   GLUCOSE 141* 05/24/2014 1245   BUN 42* 06/05/2014 0345   BUN 34* 05/24/2014 1245   CREATININE 2.12* 06/05/2014 0345   CREATININE 1.77* 04/11/2014 0830   CALCIUM 8.6 06/05/2014 0345   PROT 5.9* 06/05/2014 0345   PROT 5.9* 05/24/2014 1245   ALBUMIN 3.1* 06/05/2014 0345   AST 31 06/05/2014 0345   ALT 69* 06/05/2014 0345   ALKPHOS 111 06/05/2014 0345   BILITOT 1.3* 06/05/2014 0345   BILITOT 2.5* 05/24/2014 1245   GFRNONAA 30* 06/05/2014 0345   GFRNONAA 64 10/20/2012 0852   GFRAA 35* 06/05/2014 0345   GFRAA 74 10/20/2012 0852    Time In Time Out Total Time Spent with Patient Total Overall Time  0935 1055 35mn 857m    Greater than 50%  of this  time was spent counseling and coordinating care related to the above assessment and plan.  AlVinie SillNP Palliative Medicine Team Pager # [phone]-F 8a-5p) Team Phone # [phone]/Weekends)

## 2014-06-06 NOTE — Progress Notes (Signed)
VAD coordinator met with patient to continue education re: advanced heart failure options, including heart transplant and LVAD.  VAD evaluation ongoing, repeat RHC planned for tomorrow or next day per Dr. Aundra Dubin. Holding off on colonoscopy/EGD until pt is medically stable. Wife and daughter present, all questions answered.

## 2014-06-07 DIAGNOSIS — E871 Hypo-osmolality and hyponatremia: Secondary | ICD-10-CM

## 2014-06-07 LAB — BASIC METABOLIC PANEL
ANION GAP: 13 (ref 5–15)
BUN: 45 mg/dL — AB (ref 6–23)
CO2: 26 mmol/L (ref 19–32)
Calcium: 8.6 mg/dL (ref 8.4–10.5)
Chloride: 81 mmol/L — ABNORMAL LOW (ref 96–112)
Creatinine, Ser: 1.92 mg/dL — ABNORMAL HIGH (ref 0.50–1.35)
GFR calc Af Amer: 40 mL/min — ABNORMAL LOW (ref >90)
GFR, EST NON AFRICAN AMERICAN: 34 mL/min — AB (ref >90)
GLUCOSE: 269 mg/dL — AB (ref 70–99)
Potassium: 3.9 mmol/L (ref 3.5–5.1)
Sodium: 120 mmol/L — ABNORMAL LOW (ref 135–145)

## 2014-06-07 LAB — CARBOXYHEMOGLOBIN
CARBOXYHEMOGLOBIN: 1.4 % (ref 0.5–1.5)
METHEMOGLOBIN: 0.7 % (ref 0.0–1.5)
O2 SAT: 55.6 %
TOTAL HEMOGLOBIN: 9.4 g/dL — AB (ref 13.5–18.0)

## 2014-06-07 LAB — CBC
HEMATOCRIT: 29.4 % — AB (ref 39.0–52.0)
Hemoglobin: 9.4 g/dL — ABNORMAL LOW (ref 13.0–17.0)
MCH: 23.2 pg — ABNORMAL LOW (ref 26.0–34.0)
MCHC: 32 g/dL (ref 30.0–36.0)
MCV: 72.4 fL — ABNORMAL LOW (ref 78.0–100.0)
PLATELETS: 143 10*3/uL — AB (ref 150–400)
RBC: 4.06 MIL/uL — ABNORMAL LOW (ref 4.22–5.81)
RDW: 17.8 % — AB (ref 11.5–15.5)
WBC: 7.3 10*3/uL (ref 4.0–10.5)

## 2014-06-07 LAB — GLUCOSE, CAPILLARY
GLUCOSE-CAPILLARY: 168 mg/dL — AB (ref 70–99)
Glucose-Capillary: 132 mg/dL — ABNORMAL HIGH (ref 70–99)
Glucose-Capillary: 151 mg/dL — ABNORMAL HIGH (ref 70–99)
Glucose-Capillary: 130 mg/dL — ABNORMAL HIGH (ref 70–99)

## 2014-06-07 LAB — LUPUS ANTICOAGULANT PANEL
DRVVT: 36.2 s (ref 0.0–55.1)
PTT LA: 56.3 s — AB (ref 0.0–50.0)

## 2014-06-07 LAB — PTT-LA MIX: PTT-LA Mix: 46.8 s (ref 0.0–50.0)

## 2014-06-07 LAB — PTT-LA INCUB MIX: PTT-LA Incub Mix: 47.3 s (ref 0.0–50.0)

## 2014-06-07 LAB — HEPARIN LEVEL (UNFRACTIONATED): Heparin Unfractionated: 0.48 IU/mL (ref 0.30–0.70)

## 2014-06-07 LAB — SODIUM
SODIUM: 122 mmol/L — AB (ref 135–145)
Sodium: 123 mmol/L — ABNORMAL LOW (ref 135–145)

## 2014-06-07 LAB — PROTIME-INR
INR: 1.52 — AB (ref 0.00–1.49)
PROTHROMBIN TIME: 18.4 s — AB (ref 11.6–15.2)

## 2014-06-07 MED ORDER — HYDROCORTISONE-ACETIC ACID 1-2 % OT SOLN
4.0000 [drp] | Freq: Every day | OTIC | Status: DC
  Administered 2014-06-08 – 2014-06-15 (×8): 4 [drp] via OTIC

## 2014-06-07 MED ORDER — TOLVAPTAN 15 MG PO TABS
15.0000 mg | ORAL_TABLET | ORAL | Status: DC
  Administered 2014-06-07: 15 mg via ORAL
  Filled 2014-06-07 (×2): qty 1

## 2014-06-07 MED ORDER — FLUTICASONE PROPIONATE 50 MCG/ACT NA SUSP
2.0000 | Freq: Every day | NASAL | Status: DC
  Administered 2014-06-07 – 2014-06-16 (×9): 2 via NASAL
  Filled 2014-06-07: qty 16

## 2014-06-07 MED ORDER — HYDROCORTISONE-ACETIC ACID 1-2 % OT SOLN: 4.0000 [drp] | Freq: Every day | OTIC | Status: DC

## 2014-06-07 MED ORDER — METOLAZONE 2.5 MG PO TABS
2.5000 mg | ORAL_TABLET | Freq: Two times a day (BID) | ORAL | Status: AC
  Administered 2014-06-07 (×2): 2.5 mg via ORAL
  Filled 2014-06-07 (×2): qty 1

## 2014-06-07 MED ORDER — POTASSIUM CHLORIDE CRYS ER 20 MEQ PO TBCR
40.0000 meq | EXTENDED_RELEASE_TABLET | Freq: Once | ORAL | Status: AC
  Administered 2014-06-07: 40 meq via ORAL
  Filled 2014-06-07: qty 2

## 2014-06-07 MED ORDER — SPIRONOLACTONE 12.5 MG HALF TABLET
12.5000 mg | ORAL_TABLET | Freq: Every day | ORAL | Status: DC
  Administered 2014-06-07 – 2014-06-10 (×4): 12.5 mg via ORAL
  Filled 2014-06-07 (×5): qty 1

## 2014-06-07 MED ORDER — HYDROCORTISONE-ACETIC ACID 1-2 % OT SOLN
4.0000 [drp] | Freq: Every day | OTIC | Status: DC
  Administered 2014-06-07: 4 [drp] via OTIC
  Filled 2014-06-07: qty 10

## 2014-06-07 NOTE — Consult Note (Signed)
Came by to see the patient as a referral from insurance.  Patient was working with staff and ambulating.  [facility]ia[facility] will continue to follow up on referral and plan to meet with the patient.  For questions, please contact Charlesetta ShanksVictoria Osa Fogarty, RN, BSN, [facility] Liaison at [phone].

## 2014-06-07 NOTE — Consult Note (Signed)
ENT CONSULT:  Reason for Consult: Right otitis externa Referring Physician: Cardiology  Luke Pittman is an 69 y.o. male.  HPI: The patient has been followed as an outpatient in our office with a history of bilateral sensorineural hearing loss, severe left hearing loss and right hearing amplification. He has a history of itching and debris from the right ear canal and examination the office revealed fungal otitis externa. Patient started on Acetasol HC drops to reduce inflammatory changes. He was admitted to the hospital with progressive congestive heart failure. He complains of fullness and increased hearing loss in the right ear. No otalgia or otorrhea.  Past Medical History  Diagnosis Date  . CAD (coronary artery disease)     a. s/p CABG 1994;  b.  Patent grafts 07/2011  . Chronic systolic CHF (congestive heart failure)     a. EF 25% in 2005;  b. Echo 07/2011: EF 20-25%  . Anemia   . Hyperlipidemia   . Hyperkalemia     a. 07/2011 ->acei d/c'd.  . Ischemic cardiomyopathy   . Type II diabetes mellitus   . Stroke 01/05/2013    "possibility; lost hearing in left ear; not sure it wasn't caused by fluid pill"  . 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 Cardiac catheterization  1994; ~ 07/2011  . Coronary artery bypass graft  1994    "CABG X4", LIMA to the LAD, sequential SVG to first and second obtuse marginal, sequential SVG to the right coronary artery.  . Left heart catheterization with coronary/graft angiogram N/A 08/05/2011    Procedure: LEFT HEART CATHETERIZATION WITH Beatrix Fetters;  Surgeon: Larey Dresser, MD;  Location: [facility] CATH LAB;  Service: Cardiovascular;  Laterality: N/A;  . Right heart catheterization N/A 06/24/2014    Procedure: RIGHT HEART CATH;  Surgeon: Larey Dresser, MD;  Location: [facility] CATH LAB;  Service: Cardiovascular;  Laterality: N/A;    Family History  Problem Relation Age of Onset  . Heart failure Mother   .  Heart attack Father 54  . Heart attack Brother 68    Deceased at 47 from heart failure    Social History:  reports that he has quit smoking. His smoking use included Cigarettes. He has a .5 pack-year smoking history. He has never used smokeless tobacco. He reports that he drinks alcohol. He reports that he does not use illicit drugs.  Allergies:  Allergies  Allergen Reactions  . Beta Adrenergic Blockers Other (See Comments)    Swollen hands, Per patient currently ([id number]) tolerating Coreg.  . Fish Oil Diarrhea  . Januvia [Sitagliptin Phosphate] Other (See Comments)    Leg cramps  . Tetanus-Diphtheria Toxoids Td Swelling    Medications: I have reviewed the patient's current medications.  Results for orders placed or performed during the hospital encounter of 05/08/2014 (from the past 48 hour(s))  Hemoglobin A1c     Status: Abnormal   Collection Time: 06/05/14 10:45 AM  Result Value Ref Range   Hgb A1c MFr Bld 8.4 (H) 4.8 - 5.6 %    Comment: (NOTE)         Pre-diabetes: 5.7 - 6.4         Diabetes: >6.4         Glycemic control for adults with diabetes: <7.0    Mean Plasma Glucose 194 mg/dL    Comment: (NOTE) Performed At: [facility] [address] [place], [place] [id number] Lindon Romp  MD ZS:[id number]   Lactate dehydrogenase     Status: None   Collection Time: 06/05/14 12:10 PM  Result Value Ref Range   LDH 225 94 - 250 U/L  Uric acid     Status: Abnormal   Collection Time: 06/05/14 12:10 PM  Result Value Ref Range   Uric Acid, Serum 8.8 (H) 4.0 - 7.8 mg/dL  T4, free     Status: None   Collection Time: 06/05/14 12:10 PM  Result Value Ref Range   Free T4 1.25 0.80 - 1.80 ng/dL    Comment: Performed at [facility]  Lupus anticoagulant panel     Status: Abnormal   Collection Time: 06/05/14 12:10 PM  Result Value Ref Range   PTT Lupus Anticoagulant 56.3 (H) 0.0 - 50.0 sec    Comment: (NOTE) Additional testing confirms the presence of heparin in  the test sample. Results obtained after heparin neutralization.    DRVVT 36.2 0.0 - 55.1 sec   Lupus Anticoag Interp Comment:     Comment: (NOTE) No lupus anticoagulant was detected. These results are consistent with a deficiency of one or more intrinsic pathway factors.  Since the dRVVT was within normal limits, the factors in question are VIII, IX, XI, XII and the contact factors.  It should be noted that mixing studies performed on samples with minimally extended aPTT results can be equivocal.  Normal plasma can overcome weak inhibitors, also resulting in a correction of the mixing study. Performed At: [facility] [place], [place] [id number] Lindon Romp MD CW:[id number]   Antithrombin III     Status: Abnormal   Collection Time: 06/05/14 12:10 PM  Result Value Ref Range   AntiThromb III Func 56 (L) 75 - 120 %  HIV antibody     Status: None   Collection Time: 06/05/14 12:10 PM  Result Value Ref Range   HIV Screen 4th Generation wRfx Non Reactive Non Reactive    Comment: (NOTE) Performed At: [facility] [address] [place], [place] [id number] Lindon Romp MD [id number]   Hepatitis B core antibody, IgM     Status: None   Collection Time: 06/05/14 12:10 PM  Result Value Ref Range   Hep B C IgM NON REACTIVE NON REACTIVE    Comment: (NOTE) High levels of Hepatitis B Core IgM antibody are detectable during the acute stage of Hepatitis B. This antibody is used to differentiate current from past HBV infection. Performed at [facility]   Hepatitis B surface antibody     Status: None   Collection Time: 06/05/14 12:10 PM  Result Value Ref Range   Hep B S Ab Reactive     Comment: (NOTE)              Non Reactive: Inconsistent with immunity,                            less than 10 mIU/mL              Reactive:     Consistent with immunity,                            greater than 9.9 mIU/mL Performed At: [facility] [place], [place] [id number] Lindon Romp MD [id number]   Hepatitis B surface antigen     Status: None   Collection Time:  06/05/14 12:10 PM  Result Value Ref Range   Hepatitis B Surface Ag NEGATIVE NEGATIVE    Comment: Performed at [facility]  Prealbumin     Status: Abnormal   Collection Time: 06/05/14 12:10 PM  Result Value Ref Range   Prealbumin 12.2 (L) 17.0 - 34.0 mg/dL    Comment: Performed at [facility]     Status: None   Collection Time: 06/05/14 12:10 PM  Result Value Ref Range   PTT-LA Mix 46.8 0.0 - 50.0 sec    Comment: (NOTE) Performed At: [facility] [place], [place] [id number] Lindon Romp MD [id number]   PTT-LA Incub Mix     Status: None   Collection Time: 06/05/14 12:10 PM  Result Value Ref Range   PTT-LA INCUB MIX 47.3 0.0 - 50.0 sec    Comment: (NOTE) Performed At: [facility] [address] [place], [place] [id number] Lindon Romp MD [id number]   Glucose, capillary     Status: Abnormal   Collection Time: 06/05/14 12:40 PM  Result Value Ref Range   Glucose-Capillary 206 (H) 70 - 99 mg/dL  CEA     Status: None   Collection Time: 06/05/14  2:15 PM  Result Value Ref Range   CEA 2.5 0.0 - 4.7 ng/mL    Comment: (NOTE)       Roche ECLIA methodology       Nonsmokers  <3.9                                     Smokers     <5.6 Performed At: [facility] [place], [place] [id number] Lindon Romp MD IW:[id number]   Heparin level (unfractionated)     Status: None   Collection Time: 06/05/14  2:50 PM  Result Value Ref Range   Heparin Unfractionated 0.38 0.30 - 0.70 IU/mL    Comment:        IF HEPARIN RESULTS ARE BELOW EXPECTED VALUES, AND PATIENT DOSAGE HAS BEEN CONFIRMED, SUGGEST FOLLOW UP TESTING OF ANTITHROMBIN III LEVELS.   Glucose, capillary     Status: Abnormal   Collection Time: 06/05/14  4:35 PM  Result Value Ref  Range   Glucose-Capillary 214 (H) 70 - 99 mg/dL   Comment 1 Capillary Specimen   Glucose, capillary     Status: Abnormal   Collection Time: 06/05/14  9:17 PM  Result Value Ref Range   Glucose-Capillary 138 (H) 70 - 99 mg/dL   Comment 1 Capillary Specimen   CBC     Status: Abnormal   Collection Time: 06/06/14  4:00 AM  Result Value Ref Range   WBC 6.4 4.0 - 10.5 K/uL   RBC 3.77 (L) 4.22 - 5.81 MIL/uL   Hemoglobin 8.8 (L) 13.0 - 17.0 g/dL   HCT 26.8 (L) 39.0 - 52.0 %   MCV 71.1 (L) 78.0 - 100.0 fL   MCH 23.3 (L) 26.0 - 34.0 pg   MCHC 32.8 30.0 - 36.0 g/dL   RDW 17.4 (H) 11.5 - 15.5 %   Platelets 122 (L) 150 - 400 K/uL  Lipid panel     Status: Abnormal   Collection Time: 06/06/14  4:00 AM  Result Value Ref Range   Cholesterol 78 0 - 200 mg/dL   Triglycerides 32 <150 mg/dL   HDL 37 (L) >39 mg/dL   Total CHOL/HDL Ratio  2.1 RATIO   VLDL 6 0 - 40 mg/dL   LDL Cholesterol 35 0 - 99 mg/dL    Comment:        Total Cholesterol/HDL:CHD Risk Coronary Heart Disease Risk Table                     Men   Women  1/2 Average Risk   3.4   3.3  Average Risk       5.0   4.4  2 X Average Risk   9.6   7.1  3 X Average Risk  23.4   11.0        Use the calculated Patient Ratio above and the CHD Risk Table to determine the patient's CHD Risk.        ATP III CLASSIFICATION (LDL):  <100     mg/dL   Optimal  100-129  mg/dL   Near or Above                    Optimal  130-159  mg/dL   Borderline  160-189  mg/dL   High  >190     mg/dL   Very High   Heparin level (unfractionated)     Status: None   Collection Time: 06/06/14  5:00 AM  Result Value Ref Range   Heparin Unfractionated 0.43 0.30 - 0.70 IU/mL    Comment:        IF HEPARIN RESULTS ARE BELOW EXPECTED VALUES, AND PATIENT DOSAGE HAS BEEN CONFIRMED, SUGGEST FOLLOW UP TESTING OF ANTITHROMBIN III LEVELS.   Protime-INR     Status: Abnormal   Collection Time: 06/06/14  5:00 AM  Result Value Ref Range   Prothrombin Time 20.8 (H) 11.6 -  15.2 seconds   INR 1.78 (H) 0.00 - 1.49  Glucose, capillary     Status: None   Collection Time: 06/06/14  7:22 AM  Result Value Ref Range   Glucose-Capillary 74 70 - 99 mg/dL   Comment 1 Capillary Specimen   Basic metabolic panel     Status: Abnormal   Collection Time: 06/06/14  8:00 AM  Result Value Ref Range   Sodium 126 (L) 135 - 145 mmol/L   Potassium 3.6 3.5 - 5.1 mmol/L   Chloride 85 (L) 96 - 112 mmol/L   CO2 27 19 - 32 mmol/L   Glucose, Bld 145 (H) 70 - 99 mg/dL   BUN 43 (H) 6 - 23 mg/dL   Creatinine, Ser 2.03 (H) 0.50 - 1.35 mg/dL   Calcium 8.7 8.4 - 10.5 mg/dL   GFR calc non Af Amer 32 (L) >90 mL/min   GFR calc Af Amer 37 (L) >90 mL/min    Comment: (NOTE) The eGFR has been calculated using the CKD EPI equation. This calculation has not been validated in all clinical situations. eGFR's persistently <90 mL/min signify possible Chronic Kidney Disease.    Anion gap 14 5 - 15  Carboxyhemoglobin     Status: Abnormal   Collection Time: 06/06/14  8:50 AM  Result Value Ref Range   Total hemoglobin 9.8 (L) 13.5 - 18.0 g/dL   O2 Saturation 57.0 %   Carboxyhemoglobin 1.5 0.5 - 1.5 %   Methemoglobin 0.7 0.0 - 1.5 %  Glucose, capillary     Status: Abnormal   Collection Time: 06/06/14  1:07 PM  Result Value Ref Range   Glucose-Capillary 198 (H) 70 - 99 mg/dL  Glucose, capillary     Status: Abnormal  Collection Time: 06/06/14  4:11 PM  Result Value Ref Range   Glucose-Capillary 161 (H) 70 - 99 mg/dL   Comment 1 Notify RN   Glucose, capillary     Status: Abnormal   Collection Time: 06/06/14  9:19 PM  Result Value Ref Range   Glucose-Capillary 140 (H) 70 - 99 mg/dL   Comment 1 Capillary Specimen   Heparin level (unfractionated)     Status: None   Collection Time: 06/07/14  4:20 AM  Result Value Ref Range   Heparin Unfractionated 0.48 0.30 - 0.70 IU/mL    Comment:        IF HEPARIN RESULTS ARE BELOW EXPECTED VALUES, AND PATIENT DOSAGE HAS BEEN CONFIRMED, SUGGEST FOLLOW  UP TESTING OF ANTITHROMBIN III LEVELS.   Protime-INR     Status: Abnormal   Collection Time: 06/07/14  4:20 AM  Result Value Ref Range   Prothrombin Time 18.4 (H) 11.6 - 15.2 seconds   INR 1.52 (H) 0.00 - 1.49  CBC     Status: Abnormal   Collection Time: 06/07/14  4:20 AM  Result Value Ref Range   WBC 7.3 4.0 - 10.5 K/uL   RBC 4.06 (L) 4.22 - 5.81 MIL/uL   Hemoglobin 9.4 (L) 13.0 - 17.0 g/dL   HCT 29.4 (L) 39.0 - 52.0 %   MCV 72.4 (L) 78.0 - 100.0 fL   MCH 23.2 (L) 26.0 - 34.0 pg   MCHC 32.0 30.0 - 36.0 g/dL   RDW 17.8 (H) 11.5 - 15.5 %   Platelets 143 (L) 150 - 400 K/uL  Basic metabolic panel     Status: Abnormal   Collection Time: 06/07/14  4:20 AM  Result Value Ref Range   Sodium 120 (L) 135 - 145 mmol/L   Potassium 3.9 3.5 - 5.1 mmol/L   Chloride 81 (L) 96 - 112 mmol/L   CO2 26 19 - 32 mmol/L   Glucose, Bld 269 (H) 70 - 99 mg/dL   BUN 45 (H) 6 - 23 mg/dL   Creatinine, Ser 1.92 (H) 0.50 - 1.35 mg/dL   Calcium 8.6 8.4 - 10.5 mg/dL   GFR calc non Af Amer 34 (L) >90 mL/min   GFR calc Af Amer 40 (L) >90 mL/min    Comment: (NOTE) The eGFR has been calculated using the CKD EPI equation. This calculation has not been validated in all clinical situations. eGFR's persistently <90 mL/min signify possible Chronic Kidney Disease.    Anion gap 13 5 - 15  Carboxyhemoglobin     Status: Abnormal   Collection Time: 06/07/14  4:44 AM  Result Value Ref Range   Total hemoglobin 9.4 (L) 13.5 - 18.0 g/dL   O2 Saturation 55.6 %   Carboxyhemoglobin 1.4 0.5 - 1.5 %   Methemoglobin 0.7 0.0 - 1.5 %    Ct Abdomen Pelvis Wo Contrast  06/05/2014   CLINICAL DATA:  69 year old with cardiomyopathy. Preop evaluation prior to placement of left ventricular assist device.  EXAM: CT ABDOMEN AND PELVIS WITHOUT CONTRAST  TECHNIQUE: Multidetector CT imaging of the abdomen and pelvis was performed following the standard protocol without IV contrast.  COMPARISON:  Chest CT 06/04/2014  FINDINGS: Again noted  is a small left pleural effusion. There is cardiomegaly without significant pericardial fluid. No evidence for free intraperitoneal air.  Unenhanced CT was performed per clinician order. Lack of IV contrast limits sensitivity and specificity, especially for evaluation of abdominal/pelvic solid viscera.  There is subcutaneous edema. There is a moderate amount of  ascites around the liver and within the pelvis. Small amount of ascites around the spleen. No gross abnormality to the liver, gallbladder or spleen. Spleen size is normal. Normal appearance of the pancreas, stomach and adrenal glands. Normal appearance of the kidneys, urinary bladder and prostate.  Atherosclerotic calcifications in the abdominal aorta without aneurysm. No significant lymphadenopathy involving the abdomen or pelvis. Normal appearance of the small bowel, large bowel and appendix. Mild disc space loss at L5-S1. Mild facet arthropathy in the lower lumbar spine.  IMPRESSION: Small-to-moderate amount of ascites. Subcutaneous edema and a small left pleural effusion.   Electronically Signed   By: Markus Daft M.D.   On: 06/05/2014 12:42   Dg Orthopantogram  06/05/2014   CLINICAL DATA:  Preoperative evaluation  EXAM: ORTHOPANTOGRAM/PANORAMIC  COMPARISON:  None  FINDINGS: Dental appliances present.  Multiple prior dental RIGHT extractions with dental amalgam at multiple remaining teeth.  No mandibular fracture or bone destruction identified.  IMPRESSION: No acute mandibular abnormalities.   Electronically Signed   By: Lavonia Dana M.D.   On: 06/05/2014 12:10    ROS:ROS 12 systems reviewed and negative except as stated in HPI   Blood pressure 118/55, pulse 90, temperature 98 F (36.7 C), temperature source Oral, resp. rate 18, height _0  (1.803 m), weight 74.707 kg (164 lb 11.2 oz), SpO2 98 %.  PHYSICAL EXAM: General appearance - alert, well appearing, and in no distress Ears - otoscopic exam shows minimal debris in the right ear canal, no  erythema or obstruction. The patient has right serous otitis media without erythema the tympanic membrane, no evidence of active infection.  Studies Reviewed: None  Assessment/Plan: The patient's right otitis externa has significantly improved over the last several weeks, no evidence of active infection, debris or obstruction. He has serous otitis media which may be related to his volume overload from progressive congestive heart failure, this may improve with continued therapy and diuresis. Recommend topical nasal steroid therapy to improve nasal patency and eustachian tube function. We will re-prescribe Acetasol HC drops to reduce inflammatory changes in the ear canal. The patient may begin wearing his hearing aid. Plan follow-up as an outpatient in the next several weeks.  Slinger, Zela Sobieski 06/07/2014, 9:04 AM

## 2014-06-07 NOTE — Progress Notes (Signed)
ANTICOAGULATION CONSULT NOTE - Follow Up Consult  Pharmacy Consult for Heparin Indication: atrial fibrillation  Allergies  Allergen Reactions  . Beta Adrenergic Blockers Other (See Comments)    Swollen hands, Per patient currently ([id number]) tolerating Coreg.  . Fish Oil Diarrhea  . Januvia [Sitagliptin Phosphate] Other (See Comments)    Leg cramps  . Tetanus-Diphtheria Toxoids Td Swelling    Patient Measurements: Height: 5\' 11"  (180.3 cm) Weight: 164 lb 11.2 oz (74.707 kg) IBW/kg (Calculated) : 75.3  Vital Signs: Temp: 98 F (36.7 C) (03/09 0750) Temp Source: Oral (03/09 0750) BP: 118/55 mmHg (03/09 0750) Pulse Rate: 87 (03/09 0750)  Labs:  Recent Labs  06/05/14 0345 06/05/14 1450 06/06/14 0400 06/06/14 0500 06/06/14 0800 06/07/14 0420  HGB 8.5*  --  8.8*  --   --  9.4*  HCT 26.1*  --  26.8*  --   --  29.4*  PLT 145*  --  122*  --   --  143*  LABPROT 17.2*  --   --  20.8*  --  18.4*  INR 1.39  --   --  1.78*  --  1.52*  HEPARINUNFRC 0.22* 0.38  --  0.43  --  0.48  CREATININE 2.12*  --   --   --  2.03* 1.92*    Estimated Creatinine Clearance: 38.9 mL/min (by C-G formula based on Cr of 1.92).   Medications:  Heparin @ 1250 units/hr  Assessment: 68yom on coumadin pta for afib, admitted with CHF exacerbation and now undergoing LVAD workup. Coumadin placed on hold and he was started on a heparin bridge. Heparin level is therapeutic. CBC stable. No bleeding reported.  Goal of Therapy:  Heparin level 0.3-0.7 units/ml Monitor platelets by anticoagulation protocol: Yes   Plan:  1) Continue heparin at 1250 units/hr 2) Follow up heparin level and CBC in AM  Fredrik RiggerMarkle, Alexias Margerum Sue 06/07/2014,1:26 PM

## 2014-06-07 NOTE — Progress Notes (Signed)
Patient ID: Luke Pittman, male   DOB: 05/12/1945, 69 y.o.   MRN: [id number]   SUBJECTIVE:   Milrinone gtt continues at 0.375.  He diuresed reasonably, down 1 lb by weights today.  CVP 18 when I checked this morning, co-ox 56%.  Creatinine lower today but sodium down to 120.   Denies SOB, walking in halls during the day.   ECHO EF 20% mild LVH  RV mod-severely dilated with moderate-severe systolic dysfunction .   RHC: 06/07/2014.  RA mean 22 RV 57/24 PA 61/29, mean 43 PCWP mean 29 Oxygen saturations: PA 34% AO 95% Cardiac Output (Fick) 3.24  Cardiac Index (Fick) 1.64 PVR 4.3 WU Cardiac Output (Thermo) 3.23 Cardiac Index (Thermo) 1.63  Filed Vitals:   06/06/14 2037 06/07/14 0013 06/07/14 0357 06/07/14 0417  BP: 99/51 102/61 86/37 98/64   Pulse: 89 98 90   Temp: 98 F (36.7 C) 97.9 F (36.6 C) 97.6 F (36.4 C)   TempSrc: Oral Oral Oral   Resp: Height:      Weight:   164 lb 11.2 oz (74.707 kg)   SpO2: 97% 96% 98%     Intake/Output Summary (Last 24 hours) at 06/07/14 0710 Last data filed at 06/07/14 0600  Gross per 24 hour  Intake 2269.86 ml  Output   3555 ml  Net -1285.14 ml    LABS: Basic Metabolic Panel:  Recent Labs  40/98/11 0800 06/07/14 0420  NA 126* 120*  K 3.6 3.9  CL 85* 81*  CO2 27 26  GLUCOSE 145* 269*  BUN 43* 45*  CREATININE 2.03* 1.92*  CALCIUM 8.7 8.6   Liver Function Tests:  Recent Labs  06/05/14 0345  AST 31  ALT 69*  ALKPHOS 111  BILITOT 1.3*  PROT 5.9*  ALBUMIN 3.1*   No results for input(s): LIPASE, AMYLASE in the last 72 hours. CBC:  Recent Labs  06/06/14 0400 06/07/14 0420  WBC 6.4 7.3  HGB 8.8* 9.4*  HCT 26.8* 29.4*  MCV 71.1* 72.4*  PLT 122* 143*   Cardiac Enzymes: No results for input(s): CKTOTAL, CKMB, CKMBINDEX, TROPONINI in the last 72 hours. BNP: Invalid input(s): POCBNP D-Dimer: No results for input(s): DDIMER in the last 72 hours. Hemoglobin A1C:  Recent Labs  06/05/14 1045  HGBA1C  8.4*   Fasting Lipid Panel:  Recent Labs  06/06/14 0400  CHOL 78  HDL 37*  LDLCALC 35  TRIG 32  CHOLHDL 2.1   Thyroid Function Tests:  Recent Labs  06/05/14 0345  TSH 7.212*   Anemia Panel:  Recent Labs  06/05/14 0815  VITAMINB12 655  FOLATE 13.4  FERRITIN 76  TIBC 458*  IRON 18*  RETICCTPCT 1.3    RADIOLOGY: Ct Abdomen Pelvis Wo Contrast  06/05/2014   CLINICAL DATA:  69 year old with cardiomyopathy. Preop evaluation prior to placement of left ventricular assist device.  EXAM: CT ABDOMEN AND PELVIS WITHOUT CONTRAST  TECHNIQUE: Multidetector CT imaging of the abdomen and pelvis was performed following the standard protocol without IV contrast.  COMPARISON:  Chest CT 06/04/2014  FINDINGS: Again noted is a small left pleural effusion. There is cardiomegaly without significant pericardial fluid. No evidence for free intraperitoneal air.  Unenhanced CT was performed per clinician order. Lack of IV contrast limits sensitivity and specificity, especially for evaluation of abdominal/pelvic solid viscera.  There is subcutaneous edema. There is a moderate amount of ascites around the liver and within the pelvis. Small amount of ascites around the spleen. No  gross abnormality to the liver, gallbladder or spleen. Spleen size is normal. Normal appearance of the pancreas, stomach and adrenal glands. Normal appearance of the kidneys, urinary bladder and prostate.  Atherosclerotic calcifications in the abdominal aorta without aneurysm. No significant lymphadenopathy involving the abdomen or pelvis. Normal appearance of the small bowel, large bowel and appendix. Mild disc space loss at L5-S1. Mild facet arthropathy in the lower lumbar spine.  IMPRESSION: Small-to-moderate amount of ascites. Subcutaneous edema and a small left pleural effusion.   Electronically Signed   By: Richarda Overlie M.D.   On: 06/05/2014 12:42   Dg Orthopantogram  06/05/2014   CLINICAL DATA:  Preoperative evaluation  EXAM:  ORTHOPANTOGRAM/PANORAMIC  COMPARISON:  None  FINDINGS: Dental appliances present.  Multiple prior dental RIGHT extractions with dental amalgam at multiple remaining teeth.  No mandibular fracture or bone destruction identified.  IMPRESSION: No acute mandibular abnormalities.   Electronically Signed   By: Ulyses Southward M.D.   On: 06/05/2014 12:10   Dg Chest 2 View  05/05/2014   CLINICAL DATA:  Congestive failure  EXAM: CHEST  2 VIEW  COMPARISON:  10/03/2013  FINDINGS: Cardiac shadow remains enlarged. Postsurgical changes are again seen. No vascular congestion is seen. Very minimal effusions are noted bilaterally. No focal infiltrate is noted. No bony abnormality is seen.  IMPRESSION: Small effusions.  No other significant abnormality is noted.   Electronically Signed   By: Alcide Clever M.D.   On: 05/07/2014 13:44   Ct Chest Wo Contrast  06/04/2014   CLINICAL DATA:  Shortness of breath, history of heart failure  EXAM: CT CHEST WITHOUT CONTRAST  TECHNIQUE: Multidetector CT imaging of the chest was performed following the standard protocol without IV contrast.  COMPARISON:  05/31/2014  FINDINGS: Mild peribronchial wall thickening. Mild interstitial change with a few Kerley B-lines bilaterally. No alveolar opacification. Small bilateral pleural effusions larger on the left. No significant pericardial effusion.  Severe cardiac enlargement and coronary artery calcification. Mild thoracic aortic calcification.  Right PICC line tip extends to the cavoatrial junction.  Images through the upper abdomen show a small volume of ascites.  No acute musculoskeletal findings. No significant hilar or mediastinal adenopathy.  IMPRESSION: Findings consistent with congestive heart failure with mild interstitial pulmonary edema.   Electronically Signed   By: Esperanza Heir M.D.   On: 06/04/2014 16:08   US Abdomen Complete  05/15/2014   CLINICAL DATA:  Increased liver function test. History of diabetes, chronic kidney disease and  patient is taking Cymbalta  EXAM: ULTRASOUND ABDOMEN COMPLETE  COMPARISON:  None.  FINDINGS: Gallbladder: Cholelithiasis without pericholecystic fluid. Gallbladder wall wall thickening measuring 5.6 mm. Negative sonographic Murphy sign.  Common bile duct: Diameter: 2.5 mm  Liver: No focal lesion identified. Within normal limits in parenchymal echogenicity.  IVC: No abnormality visualized.  Pancreas: Limited visualization secondary to overlying bowel gas.  Spleen: Size and appearance within normal limits.  Right Kidney: Length: 11.5 cm. Echogenicity within normal limits. No mass or hydronephrosis visualized.  Left Kidney: Length: 11.6 cm. Echogenicity within normal limits. No mass or hydronephrosis visualized.  Abdominal aorta: No aneurysm visualized.  Other findings: There is a small amount of ascites. There are bilateral pleural effusions.  IMPRESSION: 1. Cholelithiasis. Gallbladder wall thickening which may be secondary to intrinsic gallbladder wall abnormality suggest cholecystitis, but can also be seen in the setting of ascites and hepatocellular disease versus hypoalbuminemia versus fluid overload. The lateral etiologies are favored over cholecystitis. 2. Small amount of ascites.  3. Bilateral pleural effusions.   Electronically Signed   By: Elige Ko   On: 05/01/2014 14:56   Dg Chest Port 1 View  05/31/2014   CLINICAL DATA:  PICC line placement  EXAM: PORTABLE CHEST - 1 VIEW  COMPARISON:  05/12/2014  FINDINGS: Right-sided PICC line with the tip projecting over the cavoatrial junction. No focal consolidation or pneumothorax. Trace left pleural effusion. There is stable cardiomegaly. There is evidence of prior CABG.  Mild osteoarthritis of bilateral glenohumeral joints.  IMPRESSION: Right-sided PICC line with the tip projecting over the cavoatrial junction.   Electronically Signed   By: Elige Ko   On: 05/31/2014 16:43    PHYSICAL EXAM CVP 18 General: NAD Sitting on the side of the bed.  Neck: JVP  14, no thyromegaly or thyroid nodule.  Lungs: Decreased breath sounds at bases.  CV: Nondisplaced PMI.  Heart mildly tachy, regular S1/S2, no S3/S4, no murmur.  1+ ankle edema.  Abdomen: Soft, nontender, no hepatosplenomegaly, mildly distended  Psych: Normal affect. Extremities: No clubbing or cyanosis.    TELEMETRY: Reviewed telemetry pt in atrial flutter in 80s.   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  1. Acute on chronic systolic CHF: EF 16% with at least moderate RV dysfunction by echo. He does not have an ICD (has not wanted in past).  He presented with NYHA class IIIb symptoms (dyspnea and profound fatigue), poor appetite, and markedly elevated transaminases. He remains volume overloaded on exam. BP is stable. RHC showed elevated filling pressures and low output.  On RHC, RA/PCWP = 0.75.  Milrinone gtt was begun 3/2 and he felt much better/more alert.  Milrinone turned down to 0.125 on 3/3 with fall in co-ox.  Milrinone back up to 0.25, co-ox 61% with CVP 22.  3/6 milrinone was increased to 0.375 mcg.  He remains volume overloaded with CVP 18 today, co-ox only 56%.  He is diuresing slowly, weight down another lb. - No beta blocker other than amiodarone given low output.   - Off spironolactone and lisinopril with hyperkalemia/elevated creatinine.  Also stopped digoxin for now.  - Continue milrinone 0.375 today, send co-ox again.  For now, will not increase milrinone.  - Continue lasix drip at  15 mg/hr + metolazone 2.5 mg bid.  Will add spironolactone 12.5 mg daily. - I am going to give him tolvaptan today given worsening hyponatremia, hopefully will improve diuresis.  -  He has seen LVAD coordinator.  He will be a difficult candidate for LVAD due to RV dysfunction, but he will also be a difficult candidate for home inotropes given arrhythmias. ?Milrinone as bridge to transplant.  If he goes home on milrinone, will need  Lifevest.  Plan RHC when better diuresed, possibly tomorrow.  Will need GI workup prior to discharge if we are moving towards LVAD.  2. AKI: Cardiorenal syndrome.  Creatinine a bit better today.  Continue milrinone to 0.375 mcg.    3. CAD: S/p CABG. Cath in 2013 with patent grafts. No chest pain. Continue ASA, LFTs back down so restarted statin.   4. Elevated transaminases: Suspect hepatic congestion from CHF. He is clearly volume overloaded. LFTs coming down with diuresis, abdominal US showed normal liver echotexture. 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   5. Diabetes: added nateglinide per diabetes coordinator.  Not good candidate for metformin in the future with CKD.  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  7. Atrial fibrillation: Went into atrial fibrillation => flutter this admission on milrinone.  He is on heparin gtt and amiodarone gtt, HR 80s. Not on warfarin or NOAC yet given potential procedures.  Will need to decide on DCCV => not sure he will maintain NSR if we are unable to wean off milrinone.  8. Hyponatremia: Fluid restrict, 1500 cc.  Adding tolvaptan 15 mg daily today.   Marca AnconaDalton Dahir Ayer   06/07/2014 7:10 AM

## 2014-06-07 NOTE — Progress Notes (Signed)
Mr. Luke Pittman and his wife are in room. He is complaining of foot pain and had received pain medication recently per his wife. This is his only complaint. We had a good discussion yesterday and they say they do not have any questions/concerns regarding our discussion. VAD patient came to visit with him so I left them to visit and ask them questions.   Yong ChannelAlicia Renny Remer, NP Palliative Medicine Team Pager # [phone] (M-F 8a-5p) Team Phone # [phone] (Nights/Weekends)

## 2014-06-08 DIAGNOSIS — I483 Typical atrial flutter: Secondary | ICD-10-CM

## 2014-06-08 DIAGNOSIS — I509 Heart failure, unspecified: Secondary | ICD-10-CM

## 2014-06-08 LAB — GLUCOSE, CAPILLARY
GLUCOSE-CAPILLARY: 153 mg/dL — AB (ref 70–99)
GLUCOSE-CAPILLARY: 143 mg/dL — AB (ref 70–99)
Glucose-Capillary: 100 mg/dL — ABNORMAL HIGH (ref 70–99)
Glucose-Capillary: 197 mg/dL — ABNORMAL HIGH (ref 70–99)

## 2014-06-08 LAB — FACTOR 5 LEIDEN

## 2014-06-08 LAB — BASIC METABOLIC PANEL
ANION GAP: 10 (ref 5–15)
BUN: 50 mg/dL — ABNORMAL HIGH (ref 6–23)
CO2: 28 mmol/L (ref 19–32)
Calcium: 8.5 mg/dL (ref 8.4–10.5)
Chloride: 83 mmol/L — ABNORMAL LOW (ref 96–112)
Creatinine, Ser: 2.23 mg/dL — ABNORMAL HIGH (ref 0.50–1.35)
GFR, EST AFRICAN AMERICAN: 33 mL/min — AB (ref >90)
GFR, EST NON AFRICAN AMERICAN: 29 mL/min — AB (ref >90)
Glucose, Bld: 224 mg/dL — ABNORMAL HIGH (ref 70–99)
Potassium: 3.9 mmol/L (ref 3.5–5.1)
SODIUM: 121 mmol/L — AB (ref 135–145)

## 2014-06-08 LAB — CBC
HCT: 26.4 % — ABNORMAL LOW (ref 39.0–52.0)
Hemoglobin: 8.7 g/dL — ABNORMAL LOW (ref 13.0–17.0)
MCH: 23.3 pg — ABNORMAL LOW (ref 26.0–34.0)
MCHC: 33 g/dL (ref 30.0–36.0)
MCV: 70.8 fL — AB (ref 78.0–100.0)
Platelets: 133 10*3/uL — ABNORMAL LOW (ref 150–400)
RBC: 3.73 MIL/uL — ABNORMAL LOW (ref 4.22–5.81)
RDW: 17.7 % — ABNORMAL HIGH (ref 11.5–15.5)
WBC: 5.9 10*3/uL (ref 4.0–10.5)

## 2014-06-08 LAB — CARBOXYHEMOGLOBIN
CARBOXYHEMOGLOBIN: 1.5 % (ref 0.5–1.5)
Carboxyhemoglobin: 1.4 % (ref 0.5–1.5)
Methemoglobin: 0.9 % (ref 0.0–1.5)
Methemoglobin: 0.8 % (ref 0.0–1.5)
O2 SAT: 56 %
O2 Saturation: 48.1 %
TOTAL HEMOGLOBIN: 8.9 g/dL — AB (ref 13.5–18.0)
TOTAL HEMOGLOBIN: 9.4 g/dL — AB (ref 13.5–18.0)

## 2014-06-08 LAB — HEPARIN LEVEL (UNFRACTIONATED): Heparin Unfractionated: 0.48 IU/mL (ref 0.30–0.70)

## 2014-06-08 LAB — PROTIME-INR
INR: 1.49 (ref 0.00–1.49)
PROTHROMBIN TIME: 18.1 s — AB (ref 11.6–15.2)

## 2014-06-08 MED ORDER — METOLAZONE 5 MG PO TABS
5.0000 mg | ORAL_TABLET | Freq: Two times a day (BID) | ORAL | Status: AC
  Administered 2014-06-08 (×2): 5 mg via ORAL
  Filled 2014-06-08 (×3): qty 1

## 2014-06-08 MED ORDER — TOLVAPTAN 15 MG PO TABS
30.0000 mg | ORAL_TABLET | ORAL | Status: DC
  Administered 2014-06-08 – 2014-06-10 (×3): 30 mg via ORAL
  Filled 2014-06-08 (×3): qty 2

## 2014-06-08 NOTE — Progress Notes (Signed)
Patient ID: Luke Pittman, male   DOB: 1946/03/13, 69 y.o.   MRN: [id number]   SUBJECTIVE:   Milrinone gtt continues at 0.375.  Poor diuresis, creatinine back up to 2.2.  Got tolvaptan, yesterday, sodium 121 today.  Co-ox low at 48%.   Denies SOB, walking in halls during the day.   ECHO EF 20% mild LVH  RV mod-severely dilated with moderate-severe systolic dysfunction .   RHC: 06/05/2014.  RA mean 22 RV 57/24 PA 61/29, mean 43 PCWP mean 29 Oxygen saturations: PA 34% AO 95% Cardiac Output (Fick) 3.24  Cardiac Index (Fick) 1.64 PVR 4.3 WU Cardiac Output (Thermo) 3.23 Cardiac Index (Thermo) 1.63  Scheduled Meds: . acetic acid-hydrocortisone  4 drop Right Ear QHS  . aspirin  81 mg Oral Daily  . ezetimibe  10 mg Oral Daily  . fluticasone  2 spray Each Nare QHS  . insulin aspart  0-15 Units Subcutaneous TID WC  . insulin aspart  0-5 Units Subcutaneous QHS  . insulin detemir  10 Units Subcutaneous QHS  . lactulose  20 g Oral Daily  . Mega Benfotiamine Antioxidant Capsule  1 capsule Oral Daily  . metolazone  5 mg Oral BID  . nateglinide  60 mg Oral TID WC  . pantoprazole  40 mg Oral Q1200  . pravastatin  80 mg Oral q1800  . silver sulfADIAZINE   Topical BID  . spironolactone  12.5 mg Oral Daily  . tolvaptan  30 mg Oral Q24H  . warfarin   Does not apply Once   Continuous Infusions: . sodium chloride Stopped (06/01/14 2000)  . amiodarone 30 mg/hr (06/08/14 0600)  . furosemide (LASIX) infusion 15 mg/hr (06/08/14 0400)  . heparin 1,250 Units/hr (06/07/14 2000)  . milrinone 0.375 mcg/kg/min (06/08/14 0600)   PRN Meds:.acetaminophen, HYDROcodone-acetaminophen, MUSCLE RUB, ondansetron (ZOFRAN) IV, sodium chloride   Filed Vitals:   06/08/14 0002 06/08/14 0055 06/08/14 0427 06/08/14 0720  BP: 87/54 101/55 93/56 108/57  Pulse:   87   Temp:   98.1 F (36.7 C) 96.3 F (35.7 C)  TempSrc:   Oral Axillary  Resp:   16 16  Height:      Weight:   165 lb 2.4 oz (74.91 kg)   SpO2:    97% 97%    Intake/Output Summary (Last 24 hours) at 06/08/14 0737 Last data filed at 06/08/14 0600  Gross per 24 hour  Intake   1752 ml  Output   2250 ml  Net   -498 ml    LABS: Basic Metabolic Panel:  Recent Labs  40/98/11 0420 06/07/14 2127 06/08/14 0515  NA 123*  120* 122* 121*  K 3.9  --  3.9  CL 81*  --  83*  CO2 26  --  28  GLUCOSE 269*  --  224*  BUN 45*  --  50*  CREATININE 1.92*  --  2.23*  CALCIUM 8.6  --  8.5   Liver Function Tests: No results for input(s): AST, ALT, ALKPHOS, BILITOT, PROT, ALBUMIN in the last 72 hours. No results for input(s): LIPASE, AMYLASE in the last 72 hours. CBC:  Recent Labs  06/07/14 0420 06/08/14 0515  WBC 7.3 5.9  HGB 9.4* 8.7*  HCT 29.4* 26.4*  MCV 72.4* 70.8*  PLT 143* 133*   Cardiac Enzymes: No results for input(s): CKTOTAL, CKMB, CKMBINDEX, TROPONINI in the last 72 hours. BNP: Invalid input(s): POCBNP D-Dimer: No results for input(s): DDIMER in the last 72 hours. Hemoglobin A1C:  Recent  Labs  06/05/14 1045  HGBA1C 8.4*   Fasting Lipid Panel:  Recent Labs  06/06/14 0400  CHOL 78  HDL 37*  LDLCALC 35  TRIG 32  CHOLHDL 2.1   Thyroid Function Tests: No results for input(s): TSH, T4TOTAL, T3FREE, THYROIDAB in the last 72 hours.  Invalid input(s): FREET3 Anemia Panel:  Recent Labs  06/05/14 0815  VITAMINB12 655  FOLATE 13.4  FERRITIN 76  TIBC 458*  IRON 18*  RETICCTPCT 1.3    RADIOLOGY: Ct Abdomen Pelvis Wo Contrast  06/05/2014   CLINICAL DATA:  69 year old with cardiomyopathy. Preop evaluation prior to placement of left ventricular assist device.  EXAM: CT ABDOMEN AND PELVIS WITHOUT CONTRAST  TECHNIQUE: Multidetector CT imaging of the abdomen and pelvis was performed following the standard protocol without IV contrast.  COMPARISON:  Chest CT 06/04/2014  FINDINGS: Again noted is a small left pleural effusion. There is cardiomegaly without significant pericardial fluid. No evidence for free  intraperitoneal air.  Unenhanced CT was performed per clinician order. Lack of IV contrast limits sensitivity and specificity, especially for evaluation of abdominal/pelvic solid viscera.  There is subcutaneous edema. There is a moderate amount of ascites around the liver and within the pelvis. Small amount of ascites around the spleen. No gross abnormality to the liver, gallbladder or spleen. Spleen size is normal. Normal appearance of the pancreas, stomach and adrenal glands. Normal appearance of the kidneys, urinary bladder and prostate.  Atherosclerotic calcifications in the abdominal aorta without aneurysm. No significant lymphadenopathy involving the abdomen or pelvis. Normal appearance of the small bowel, large bowel and appendix. Mild disc space loss at L5-S1. Mild facet arthropathy in the lower lumbar spine.  IMPRESSION: Small-to-moderate amount of ascites. Subcutaneous edema and a small left pleural effusion.   Electronically Signed   By: Richarda OverlieAdam  Henn M.D.   On: 06/05/2014 12:42   Dg Orthopantogram  06/05/2014   CLINICAL DATA:  Preoperative evaluation  EXAM: ORTHOPANTOGRAM/PANORAMIC  COMPARISON:  None  FINDINGS: Dental appliances present.  Multiple prior dental RIGHT extractions with dental amalgam at multiple remaining teeth.  No mandibular fracture or bone destruction identified.  IMPRESSION: No acute mandibular abnormalities.   Electronically Signed   By: Ulyses SouthwardMark  Boles M.D.   On: 06/05/2014 12:10   Dg Chest 2 View  05/04/2014   CLINICAL DATA:  Congestive failure  EXAM: CHEST  2 VIEW  COMPARISON:  10/03/2013  FINDINGS: Cardiac shadow remains enlarged. Postsurgical changes are again seen. No vascular congestion is seen. Very minimal effusions are noted bilaterally. No focal infiltrate is noted. No bony abnormality is seen.  IMPRESSION: Small effusions.  No other significant abnormality is noted.   Electronically Signed   By: Alcide CleverMark  Lukens M.D.   On: 05/04/2014 13:44   Ct Chest Wo Contrast  06/04/2014    CLINICAL DATA:  Shortness of breath, history of heart failure  EXAM: CT CHEST WITHOUT CONTRAST  TECHNIQUE: Multidetector CT imaging of the chest was performed following the standard protocol without IV contrast.  COMPARISON:  05/31/2014  FINDINGS: Mild peribronchial wall thickening. Mild interstitial change with a few Kerley B-lines bilaterally. No alveolar opacification. Small bilateral pleural effusions larger on the left. No significant pericardial effusion.  Severe cardiac enlargement and coronary artery calcification. Mild thoracic aortic calcification.  Right PICC line tip extends to the cavoatrial junction.  Images through the upper abdomen show a small volume of ascites.  No acute musculoskeletal findings. No significant hilar or mediastinal adenopathy.  IMPRESSION: Findings consistent with congestive heart  failure with mild interstitial pulmonary edema.   Electronically Signed   By: Esperanza Heir M.D.   On: 06/04/2014 16:08   US Abdomen Complete  05/14/2014   CLINICAL DATA:  Increased liver function test. History of diabetes, chronic kidney disease and patient is taking Cymbalta  EXAM: ULTRASOUND ABDOMEN COMPLETE  COMPARISON:  None.  FINDINGS: Gallbladder: Cholelithiasis without pericholecystic fluid. Gallbladder wall wall thickening measuring 5.6 mm. Negative sonographic Murphy sign.  Common bile duct: Diameter: 2.5 mm  Liver: No focal lesion identified. Within normal limits in parenchymal echogenicity.  IVC: No abnormality visualized.  Pancreas: Limited visualization secondary to overlying bowel gas.  Spleen: Size and appearance within normal limits.  Right Kidney: Length: 11.5 cm. Echogenicity within normal limits. No mass or hydronephrosis visualized.  Left Kidney: Length: 11.6 cm. Echogenicity within normal limits. No mass or hydronephrosis visualized.  Abdominal aorta: No aneurysm visualized.  Other findings: There is a small amount of ascites. There are bilateral pleural effusions.  IMPRESSION:  1. Cholelithiasis. Gallbladder wall thickening which may be secondary to intrinsic gallbladder wall abnormality suggest cholecystitis, but can also be seen in the setting of ascites and hepatocellular disease versus hypoalbuminemia versus fluid overload. The lateral etiologies are favored over cholecystitis. 2. Small amount of ascites. 3. Bilateral pleural effusions.   Electronically Signed   By: Elige Ko   On: 05/14/2014 14:56   Dg Chest Port 1 View  05/31/2014   CLINICAL DATA:  PICC line placement  EXAM: PORTABLE CHEST - 1 VIEW  COMPARISON:  05/14/2014  FINDINGS: Right-sided PICC line with the tip projecting over the cavoatrial junction. No focal consolidation or pneumothorax. Trace left pleural effusion. There is stable cardiomegaly. There is evidence of prior CABG.  Mild osteoarthritis of bilateral glenohumeral joints.  IMPRESSION: Right-sided PICC line with the tip projecting over the cavoatrial junction.   Electronically Signed   By: Elige Ko   On: 05/31/2014 16:43    PHYSICAL EXAM CVP 22 General: NAD Sitting on the side of the bed.  Neck: JVP 14, no thyromegaly or thyroid nodule.  Lungs: Decreased breath sounds at bases.  CV: Nondisplaced PMI.  Heart mildly tachy, regular S1/S2, no S3/S4, no murmur.  1+ ankle edema.  Abdomen: Soft, nontender, no hepatosplenomegaly, mildly distended  Psych: Normal affect. Extremities: No clubbing or cyanosis.    TELEMETRY: Reviewed telemetry pt in atrial flutter in 80s.   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  1. Acute on chronic systolic CHF: EF 13% with at least moderate RV dysfunction by echo. He does not have an ICD (has not wanted in past).  He presented with NYHA class IIIb symptoms (dyspnea and profound fatigue), poor appetite, and markedly elevated transaminases. He remains volume overloaded on exam. BP is stable. RHC showed elevated filling pressures and low output.  On  RHC, RA/PCWP = 0.75.  Milrinone gtt was begun 3/2 and he felt much better/more alert.  Milrinone turned down to 0.125 on 3/3 with fall in co-ox.  Milrinone back up to 0.25, co-ox 61% with CVP 22.  3/6 milrinone was increased to 0.375 mcg.  He remains volume overloaded with CVP 22 today, co-ox only 48%.  Poor diuresis.  - Increase milrinone to 0.5 mcg/kg/min today with poor output.  HR is tolerating.  Repeat co-ox in pm.  - No beta blocker other than amiodarone given low output.   - Off lisinopril with hyperkalemia/elevated creatinine.  Also stopped digoxin for  now.   - Increase Lasix gtt to 20 mg/hr and metolazone to 5 mg bid.  - I am going to give him tolvaptan again today with hyponatremia, increase to 30 mg daily.   -  He has seen LVAD coordinator.  He will be a difficult candidate for LVAD due to RV dysfunction, but he will also be a difficult candidate for home inotropes given arrhythmias. ?Milrinone as bridge to transplant.  If he goes home on milrinone, will need Lifevest.  Plan RHC when better diuresed, not today.  Will need GI workup prior to discharge if we are moving towards LVAD.  2. AKI: Cardiorenal syndrome.  Creatinine worse today.  Increasing milrinone.     3. CAD: S/p CABG. Cath in 2013 with patent grafts. No chest pain. Continue ASA, LFTs back down so restarted statin.   4. Elevated transaminases: Suspect hepatic congestion from CHF. He is clearly volume overloaded. LFTs coming down with diuresis, abdominal US showed normal liver echotexture. 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   5. Diabetes: added nateglinide per diabetes coordinator.  Not good candidate for metformin in the future with CKD.  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  7. Atrial fibrillation: Went into atrial fibrillation => flutter this admission on milrinone.  He is on heparin gtt and amiodarone gtt, HR 80s. Not on warfarin or NOAC yet given potential procedures.  Will need to decide on DCCV => not  sure he will maintain NSR if we are unable to wean off milrinone.  8. Hyponatremia: Fluid restrict, 1500 cc.  Increasing tolvaptan to 30 mg daily.    Marca Ancona   06/08/2014 7:37 AM

## 2014-06-08 NOTE — Consult Note (Signed)
Came to visit patient at bedside to discuss and [facility]. Patient sleeping soundly at the time. Patient's nurse reports patient was just up walking around the unit and is now resting.Will come back at later time to engage for services. Hyman Hopestika Amybeth Sieg,MSN- RN,BSN- [facility] [phone]

## 2014-06-08 NOTE — Progress Notes (Signed)
ANTICOAGULATION CONSULT NOTE - Follow Up Consult  Pharmacy Consult for Heparin Indication: atrial fibrillation  Allergies  Allergen Reactions  . Beta Adrenergic Blockers Other (See Comments)    Swollen hands, Per patient currently ([id number](Jul2015) tolerating Coreg.  . Fish Oil Diarrhea  . Januvia [Sitagliptin Phosphate] Other (See Comments)    Leg cramps  . Tetanus-Diphtheria Toxoids Td Swelling    Patient Measurements: Height: 5\' 11"  (180.3 cm) Weight: 165 lb 2.4 oz (74.91 kg) IBW/kg (Calculated) : 75.3  Vital Signs: Temp: 96.6 F (35.9 C) (03/10 1122) Temp Source: Axillary (03/10 1122) BP: 94/40 mmHg (03/10 1122) Pulse Rate: 82 (03/10 1122)  Labs:  Recent Labs  06/06/14 0400 06/06/14 0500 06/06/14 0800 06/07/14 0420 06/08/14 0515  HGB 8.8*  --   --  9.4* 8.7*  HCT 26.8*  --   --  29.4* 26.4*  PLT 122*  --   --  143* 133*  LABPROT  --  20.8*  --  18.4* 18.1*  INR  --  1.78*  --  1.52* 1.49  HEPARINUNFRC  --  0.43  --  0.48 0.48  CREATININE  --   --  2.03* 1.92* 2.23*    Estimated Creatinine Clearance: 33.6 mL/min (by C-G formula based on Cr of 2.23).   Medications:  Heparin @ 1250 units/hr  Assessment: 68yom on coumadin pta for afib, admitted with CHF exacerbation and now undergoing LVAD workup. Coumadin placed on hold and he was started on a heparin bridge. Heparin level remains therapeutic. CBC stable. No bleeding reported.  Goal of Therapy:  Heparin level 0.3-0.7 units/ml Monitor platelets by anticoagulation protocol: Yes   Plan:  - Continue heparin at 1250 units/hr - Monitor daily heparin level, CBC, s/sx of bleeding - Follow up long term anticoag plans  Waynette Butteryegan K. Nayelie Gionfriddo, PharmD Clinical Pharmacy Resident Pager: [phone] 06/08/2014 1:22 PM

## 2014-06-08 NOTE — Progress Notes (Signed)
CARDIAC REHAB PHASE I   PRE:  Rate/Rhythm: 88 afib    BP: sitting 93/41    SaO2:   MODE:  Ambulation: 1750 ft   POST:  Rate/Rhythm: 95 aflutter with PVCs    BP: sitting 96/40     SaO2:   Tolerated well, walking independently. Pt sts he walked 10 laps at once yesterday and noticed some fatigue about the 8th lap. No c/o today. Enjoys walking. Will continue to follow as time allows. [id number]   Elissa LovettReeve, Cleophas Yoak [place] DickKristan CES, ACSM 06/08/2014 11:23 AM

## 2014-06-09 ENCOUNTER — Ambulatory Visit: Payer: Self-pay

## 2014-06-09 ENCOUNTER — Encounter (HOSPITAL_COMMUNITY): Admission: AD | Disposition: E | Payer: Self-pay | Source: Ambulatory Visit | Attending: Cardiology

## 2014-06-09 DIAGNOSIS — I509 Heart failure, unspecified: Secondary | ICD-10-CM

## 2014-06-09 HISTORY — PX: RIGHT HEART CATHETERIZATION: SHX5447

## 2014-06-09 LAB — GLUCOSE, CAPILLARY
GLUCOSE-CAPILLARY: 104 mg/dL — AB (ref 70–99)
Glucose-Capillary: 117 mg/dL — ABNORMAL HIGH (ref 70–99)
Glucose-Capillary: 162 mg/dL — ABNORMAL HIGH (ref 70–99)
Glucose-Capillary: 176 mg/dL — ABNORMAL HIGH (ref 70–99)

## 2014-06-09 LAB — BASIC METABOLIC PANEL
Anion gap: 13 (ref 5–15)
BUN: 57 mg/dL — AB (ref 6–23)
CO2: 27 mmol/L (ref 19–32)
CREATININE: 2.44 mg/dL — AB (ref 0.50–1.35)
Calcium: 9 mg/dL (ref 8.4–10.5)
Chloride: 81 mmol/L — ABNORMAL LOW (ref 96–112)
GFR calc non Af Amer: 26 mL/min — ABNORMAL LOW (ref >90)
GFR, EST AFRICAN AMERICAN: 30 mL/min — AB (ref >90)
Glucose, Bld: 154 mg/dL — ABNORMAL HIGH (ref 70–99)
Potassium: 3.7 mmol/L (ref 3.5–5.1)
Sodium: 121 mmol/L — ABNORMAL LOW (ref 135–145)

## 2014-06-09 LAB — TYPE AND SCREEN
ABO/RH(D): A POS
Antibody Screen: NEGATIVE
UNIT DIVISION: 0
Unit division: 0

## 2014-06-09 LAB — CBC
HCT: 27.6 % — ABNORMAL LOW (ref 39.0–52.0)
HEMOGLOBIN: 9 g/dL — AB (ref 13.0–17.0)
MCH: 23.6 pg — ABNORMAL LOW (ref 26.0–34.0)
MCHC: 32.6 g/dL (ref 30.0–36.0)
MCV: 72.3 fL — ABNORMAL LOW (ref 78.0–100.0)
Platelets: 130 10*3/uL — ABNORMAL LOW (ref 150–400)
RBC: 3.82 MIL/uL — AB (ref 4.22–5.81)
RDW: 18.1 % — ABNORMAL HIGH (ref 11.5–15.5)
WBC: 6 10*3/uL (ref 4.0–10.5)

## 2014-06-09 LAB — POCT I-STAT 3, VENOUS BLOOD GAS (G3P V)
Bicarbonate: 24.3 mEq/L — ABNORMAL HIGH (ref 20.0–24.0)
O2 Saturation: 61 %
PCO2 VEN: 38.2 mmHg — AB (ref 45.0–50.0)
TCO2: 25 mmol/L (ref 0–100)
pH, Ven: 7.412 — ABNORMAL HIGH (ref 7.250–7.300)
pO2, Ven: 31 mmHg (ref 30.0–45.0)

## 2014-06-09 LAB — CARBOXYHEMOGLOBIN
CARBOXYHEMOGLOBIN: 1.2 % (ref 0.5–1.5)
METHEMOGLOBIN: 0.7 % (ref 0.0–1.5)
O2 Saturation: 53.9 %
Total hemoglobin: 10.8 g/dL — ABNORMAL LOW (ref 13.5–18.0)

## 2014-06-09 LAB — PROTIME-INR
INR: 1.4 (ref 0.00–1.49)
PROTHROMBIN TIME: 17.3 s — AB (ref 11.6–15.2)

## 2014-06-09 LAB — HEPARIN LEVEL (UNFRACTIONATED): Heparin Unfractionated: 0.36 IU/mL (ref 0.30–0.70)

## 2014-06-09 LAB — POCT ACTIVATED CLOTTING TIME: Activated Clotting Time: 147 seconds

## 2014-06-09 SURGERY — RIGHT HEART CATH
Anesthesia: LOCAL

## 2014-06-09 MED ORDER — SODIUM CHLORIDE 0.9 % IV SOLN
250.0000 mL | INTRAVENOUS | Status: DC | PRN
  Administered 2014-06-17: 250 mL via INTRAVENOUS

## 2014-06-09 MED ORDER — MIDAZOLAM HCL 2 MG/2ML IJ SOLN
INTRAMUSCULAR | Status: AC
  Filled 2014-06-09: qty 2

## 2014-06-09 MED ORDER — FENTANYL CITRATE 0.05 MG/ML IJ SOLN
INTRAMUSCULAR | Status: AC
  Filled 2014-06-09: qty 2

## 2014-06-09 MED ORDER — SODIUM CHLORIDE 0.9 % IV SOLN
25.0000 mg | Freq: Once | INTRAVENOUS | Status: AC
  Administered 2014-06-09: 25 mg via INTRAVENOUS
  Filled 2014-06-09: qty 0.5

## 2014-06-09 MED ORDER — SODIUM CHLORIDE 0.9 % IJ SOLN: 3.0000 mL | INTRAMUSCULAR | Status: DC | PRN

## 2014-06-09 MED ORDER — HEPARIN (PORCINE) IN NACL 100-0.45 UNIT/ML-% IJ SOLN
1250.0000 [IU]/h | INTRAMUSCULAR | Status: DC
  Administered 2014-06-09: 1250 [IU]/h via INTRAVENOUS
  Filled 2014-06-09 (×2): qty 250

## 2014-06-09 MED ORDER — ONDANSETRON HCL 4 MG/2ML IJ SOLN
4.0000 mg | Freq: Four times a day (QID) | INTRAMUSCULAR | Status: DC | PRN
Start: 2014-06-09 — End: 2014-06-14
  Administered 2014-06-12 – 2014-06-14 (×5): 4 mg via INTRAVENOUS
  Filled 2014-06-09 (×5): qty 2

## 2014-06-09 MED ORDER — SODIUM CHLORIDE 0.9 % IJ SOLN
3.0000 mL | Freq: Two times a day (BID) | INTRAMUSCULAR | Status: DC
  Administered 2014-06-10 – 2014-06-17 (×11): 3 mL via INTRAVENOUS

## 2014-06-09 MED ORDER — LIDOCAINE HCL (PF) 1 % IJ SOLN
INTRAMUSCULAR | Status: AC
  Filled 2014-06-09: qty 30

## 2014-06-09 MED ORDER — SODIUM CHLORIDE 0.9 % IV SOLN
1000.0000 mg | Freq: Once | INTRAVENOUS | Status: AC
  Administered 2014-06-09: 1000 mg via INTRAVENOUS
  Filled 2014-06-09 (×3): qty 20

## 2014-06-09 MED ORDER — POTASSIUM CHLORIDE CRYS ER 20 MEQ PO TBCR
40.0000 meq | EXTENDED_RELEASE_TABLET | Freq: Once | ORAL | Status: AC
  Administered 2014-06-09: 40 meq via ORAL
  Filled 2014-06-09: qty 2

## 2014-06-09 MED ORDER — SODIUM CHLORIDE 0.9 % IJ SOLN
3.0000 mL | Freq: Two times a day (BID) | INTRAMUSCULAR | Status: DC
  Administered 2014-06-09 (×2): 3 mL via INTRAVENOUS

## 2014-06-09 MED ORDER — SODIUM CHLORIDE 0.9 % IV SOLN
250.0000 mL | INTRAVENOUS | Status: DC | PRN
Start: 2014-06-09 — End: 2014-06-12

## 2014-06-09 MED ORDER — ACETAMINOPHEN 325 MG PO TABS
650.0000 mg | ORAL_TABLET | ORAL | Status: DC | PRN
  Administered 2014-06-13: 650 mg via ORAL
  Filled 2014-06-09: qty 2

## 2014-06-09 MED ORDER — HEPARIN (PORCINE) IN NACL 2-0.9 UNIT/ML-% IJ SOLN
INTRAMUSCULAR | Status: AC
  Filled 2014-06-09: qty 500

## 2014-06-09 MED ORDER — METOLAZONE 5 MG PO TABS
5.0000 mg | ORAL_TABLET | Freq: Once | ORAL | Status: AC
  Administered 2014-06-09: 5 mg via ORAL
  Filled 2014-06-09: qty 1

## 2014-06-09 NOTE — Progress Notes (Signed)
Mr. Ronne BinningWray looks well today and says his neuropathic foot pain is better controlled today. I asked if they had any questions or if they have had a chance to look at this Living Will yet. Mrs. Ronne BinningWray tells me that he looked with his daughter but not since - she very much wants him to complete because she says she cannot make any decisions towards comfort if it came to that and he has expressed he would not want life support if options such as VAD/transplant are not available. Mr. Ronne BinningWray agrees he should complete to help his wife when the time comes. He wishes to read and consider option over the weekend and I will return Monday to help him complete.   Yong ChannelAlicia Airelle Everding, NP Palliative Medicine Team Pager # [phone] (M-F 8a-5p) Team Phone # [phone] (Nights/Weekends)

## 2014-06-09 NOTE — Progress Notes (Signed)
Patient ID: Luke Pittman, male   DOB: 11/26/1945, 68 y.o.   MRN: [id number]   SUBJECTIVE:   Milrinone gtt now at 0.5.  Diuresis better but creatinine at 2.4.  Got tolvaptan, yesterday, sodium still 121 today.  Co-ox still low at 54%. CVP better today, 14-15.   Denies SOB, walking in halls during the day.   ECHO EF 20% mild LVH  RV mod-severely dilated with moderate-severe systolic dysfunction .   RHC: 06/28/2014.  RA mean 22 RV 57/24 PA 61/29, mean 43 PCWP mean 29 Oxygen saturations: PA 34% AO 95% Cardiac Output (Fick) 3.24  Cardiac Index (Fick) 1.64 PVR 4.3 WU Cardiac Output (Thermo) 3.23 Cardiac Index (Thermo) 1.63  Scheduled Meds: . acetic acid-hydrocortisone  4 drop Right Ear QHS  . aspirin  81 mg Oral Daily  . ezetimibe  10 mg Oral Daily  . fluticasone  2 spray Each Nare QHS  . insulin aspart  0-15 Units Subcutaneous TID WC  . insulin aspart  0-5 Units Subcutaneous QHS  . insulin detemir  10 Units Subcutaneous QHS  . lactulose  20 g Oral Daily  . Mega Benfotiamine Antioxidant Capsule  1 capsule Oral Daily  . metolazone  5 mg Oral Once  . nateglinide  60 mg Oral TID WC  . pantoprazole  40 mg Oral Q1200  . potassium chloride  40 mEq Oral Once  . pravastatin  80 mg Oral q1800  . silver sulfADIAZINE   Topical BID  . spironolactone  12.5 mg Oral Daily  . tolvaptan  30 mg Oral Q24H  . warfarin   Does not apply Once   Continuous Infusions: . sodium chloride Stopped (06/01/14 2000)  . amiodarone 30 mg/hr (06/12/2014 0543)  . furosemide (LASIX) infusion 20 mg/hr (05/30/2014 0738)  . heparin 1,250 Units/hr (06/08/14 1400)  . milrinone 0.5 mcg/kg/min (06/14/2014 0229)   PRN Meds:.acetaminophen, HYDROcodone-acetaminophen, MUSCLE RUB, ondansetron (ZOFRAN) IV, sodium chloride   Filed Vitals:   06/08/14 2300 06/05/2014 0233 06/12/2014 0408 06/11/2014 0736  BP: 106/65  101/57 107/59  Pulse: 87  87   Temp: 97.6 F (36.4 C)  97.9 F (36.6 C) 98.2 F (36.8 C)  TempSrc: Oral  Oral  Oral  Resp: 18  18 18  Height:      Weight:  156 lb 15.5 oz (71.2 kg)    SpO2: 98%  97% 96%    Intake/Output Summary (Last 24 hours) at 06/11/2014 0754 Last data filed at 06/29/2014 0735  Gross per 24 hour  Intake   2123 ml  Output   4050 ml  Net  -1927 ml    LABS: Basic Metabolic Panel:  Recent Labs  06/08/14 0515 06/14/2014 0345  NA 121* 121*  K 3.9 3.7  CL 83* 81*  CO2 28 27  GLUCOSE 224* 154*  BUN 50* 57*  CREATININE 2.23* 2.44*  CALCIUM 8.5 9.0   Liver Function Tests: No results for input(s): AST, ALT, ALKPHOS, BILITOT, PROT, ALBUMIN in the last 72 hours. No results for input(s): LIPASE, AMYLASE in the last 72 hours. CBC:  Recent Labs  06/08/14 0515 06/05/2014 0345  WBC 5.9 6.0  HGB 8.7* 9.0*  HCT 26.4* 27.6*  MCV 70.8* 72.3*  PLT 133* 130*   Cardiac Enzymes: No results for input(s): CKTOTAL, CKMB, CKMBINDEX, TROPONINI in the last 72 hours. BNP: Invalid input(s): POCBNP D-Dimer: No results for input(s): DDIMER in the last 72 hours. Hemoglobin A1C: No results for input(s): HGBA1C in the last 72 hours. Fasting Lipid   Panel: No results for input(s): CHOL, HDL, LDLCALC, TRIG, CHOLHDL, LDLDIRECT in the last 72 hours. Thyroid Function Tests: No results for input(s): TSH, T4TOTAL, T3FREE, THYROIDAB in the last 72 hours.  Invalid input(s): FREET3 Anemia Panel: No results for input(s): VITAMINB12, FOLATE, FERRITIN, TIBC, IRON, RETICCTPCT in the last 72 hours.  RADIOLOGY: Ct Abdomen Pelvis Wo Contrast  06/05/2014   CLINICAL DATA:  68-year-old with cardiomyopathy. Preop evaluation prior to placement of left ventricular assist device.  EXAM: CT ABDOMEN AND PELVIS WITHOUT CONTRAST  TECHNIQUE: Multidetector CT imaging of the abdomen and pelvis was performed following the standard protocol without IV contrast.  COMPARISON:  Chest CT 06/04/2014  FINDINGS: Again noted is a small left pleural effusion. There is cardiomegaly without significant pericardial fluid. No evidence  for free intraperitoneal air.  Unenhanced CT was performed per clinician order. Lack of IV contrast limits sensitivity and specificity, especially for evaluation of abdominal/pelvic solid viscera.  There is subcutaneous edema. There is a moderate amount of ascites around the liver and within the pelvis. Small amount of ascites around the spleen. No gross abnormality to the liver, gallbladder or spleen. Spleen size is normal. Normal appearance of the pancreas, stomach and adrenal glands. Normal appearance of the kidneys, urinary bladder and prostate.  Atherosclerotic calcifications in the abdominal aorta without aneurysm. No significant lymphadenopathy involving the abdomen or pelvis. Normal appearance of the small bowel, large bowel and appendix. Mild disc space loss at L5-S1. Mild facet arthropathy in the lower lumbar spine.  IMPRESSION: Small-to-moderate amount of ascites. Subcutaneous edema and a small left pleural effusion.   Electronically Signed   By: Adam  Henn M.D.   On: 06/05/2014 12:42   Dg Orthopantogram  06/05/2014   CLINICAL DATA:  Preoperative evaluation  EXAM: ORTHOPANTOGRAM/PANORAMIC  COMPARISON:  None  FINDINGS: Dental appliances present.  Multiple prior dental RIGHT extractions with dental amalgam at multiple remaining teeth.  No mandibular fracture or bone destruction identified.  IMPRESSION: No acute mandibular abnormalities.   Electronically Signed   By: Mark  Boles M.D.   On: 06/05/2014 12:10   Dg Chest 2 View  05/04/2014   CLINICAL DATA:  Congestive failure  EXAM: CHEST  2 VIEW  COMPARISON:  10/03/2013  FINDINGS: Cardiac shadow remains enlarged. Postsurgical changes are again seen. No vascular congestion is seen. Very minimal effusions are noted bilaterally. No focal infiltrate is noted. No bony abnormality is seen.  IMPRESSION: Small effusions.  No other significant abnormality is noted.   Electronically Signed   By: Mark  Lukens M.D.   On: 05/01/2014 13:44   Ct Chest Wo  Contrast  06/04/2014   CLINICAL DATA:  Shortness of breath, history of heart failure  EXAM: CT CHEST WITHOUT CONTRAST  TECHNIQUE: Multidetector CT imaging of the chest was performed following the standard protocol without IV contrast.  COMPARISON:  05/31/2014  FINDINGS: Mild peribronchial wall thickening. Mild interstitial change with a few Kerley B-lines bilaterally. No alveolar opacification. Small bilateral pleural effusions larger on the left. No significant pericardial effusion.  Severe cardiac enlargement and coronary artery calcification. Mild thoracic aortic calcification.  Right PICC line tip extends to the cavoatrial junction.  Images through the upper abdomen show a small volume of ascites.  No acute musculoskeletal findings. No significant hilar or mediastinal adenopathy.  IMPRESSION: Findings consistent with congestive heart failure with mild interstitial pulmonary edema.   Electronically Signed   By: Raymond  Rubner M.D.   On: 06/04/2014 16:08   Us Abdomen Complete  05/06/2014     CLINICAL DATA:  Increased liver function test. History of diabetes, chronic kidney disease and patient is taking Cymbalta  EXAM: ULTRASOUND ABDOMEN COMPLETE  COMPARISON:  None.  FINDINGS: Gallbladder: Cholelithiasis without pericholecystic fluid. Gallbladder wall wall thickening measuring 5.6 mm. Negative sonographic Murphy sign.  Common bile duct: Diameter: 2.5 mm  Liver: No focal lesion identified. Within normal limits in parenchymal echogenicity.  IVC: No abnormality visualized.  Pancreas: Limited visualization secondary to overlying bowel gas.  Spleen: Size and appearance within normal limits.  Right Kidney: Length: 11.5 cm. Echogenicity within normal limits. No mass or hydronephrosis visualized.  Left Kidney: Length: 11.6 cm. Echogenicity within normal limits. No mass or hydronephrosis visualized.  Abdominal aorta: No aneurysm visualized.  Other findings: There is a small amount of ascites. There are bilateral pleural  effusions.  IMPRESSION: 1. Cholelithiasis. Gallbladder wall thickening which may be secondary to intrinsic gallbladder wall abnormality suggest cholecystitis, but can also be seen in the setting of ascites and hepatocellular disease versus hypoalbuminemia versus fluid overload. The lateral etiologies are favored over cholecystitis. 2. Small amount of ascites. 3. Bilateral pleural effusions.   Electronically Signed   By: Hetal  Patel   On: 05/12/2014 14:56   Dg Chest Port 1 View  05/31/2014   CLINICAL DATA:  PICC line placement  EXAM: PORTABLE CHEST - 1 VIEW  COMPARISON:  05/14/2014  FINDINGS: Right-sided PICC line with the tip projecting over the cavoatrial junction. No focal consolidation or pneumothorax. Trace left pleural effusion. There is stable cardiomegaly. There is evidence of prior CABG.  Mild osteoarthritis of bilateral glenohumeral joints.  IMPRESSION: Right-sided PICC line with the tip projecting over the cavoatrial junction.   Electronically Signed   By: Hetal  Patel   On: 05/31/2014 16:43    PHYSICAL EXAM CVP 14-15 General: NAD Sitting on the side of the bed.  Neck: JVP 14, no thyromegaly or thyroid nodule.  Lungs: Decreased breath sounds at bases.  CV: Nondisplaced PMI.  Heart mildly tachy, regular S1/S2, no S3/S4, no murmur.  1+ ankle edema.  Abdomen: Soft, nontender, no hepatosplenomegaly, mildly distended  Psych: Normal affect. Extremities: No clubbing or cyanosis.    TELEMETRY: Reviewed telemetry pt in atrial flutter in 80s.   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  1. Acute on chronic systolic CHF: EF 20% with at least moderate RV dysfunction by echo. He does not have an ICD (has not wanted in past).  He presented with NYHA class IIIb symptoms (dyspnea and profound fatigue), poor appetite, and markedly elevated transaminases. He remains volume overloaded on exam. BP is stable. RHC showed elevated filling  pressures and low output.  On RHC, RA/PCWP = 0.75.  Milrinone gtt was begun 3/2 and he felt much better/more alert.  Milrinone turned down to 0.125 on 3/3 with fall in co-ox.  Milrinone back up to 0.25, co-ox 61% with CVP 22.  3/6 milrinone was increased to 0.375 mcg then up to 0.5 on 3/10.  Co-ox still low at 54% with CVP 14-15 (some improvement).  However, creatinine has risen. -Continue milrinone at 0.5.   - RHC today to formally assess filling pressures and cardiac output.  - No beta blocker other than amiodarone given low output.   - Off lisinopril with hyperkalemia/elevated creatinine.  Also stopped digoxin for now.   - Continue Lasix gtt 20 mg/hr, will give metolazone once today for now with creatinine rise.  - I am going to give him   tolvaptan again today with hyponatremia, 30 mg.    -  He has seen LVAD coordinator.  He will be a difficult candidate for LVAD due to RV dysfunction, but he will also be a difficult candidate for home inotropes given arrhythmias. ?Milrinone as bridge to transplant.  If he goes home on milrinone, will need Lifevest.  CVP better today but still high, will do RHC.  Will need GI workup prior to discharge if we are moving towards LVAD.  2. AKI: Cardiorenal syndrome.  Creatinine worse today.  Plan RHC.     3. CAD: S/p CABG. Cath in 2013 with patent grafts. No chest pain. Continue ASA, LFTs back down so restarted statin.   4. Elevated transaminases: Suspect hepatic congestion from CHF. He is clearly volume overloaded. LFTs coming down with diuresis, abdominal US showed normal liver echotexture. 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   5. Diabetes: added nateglinide per diabetes coordinator.  Not good candidate for metformin in the future with CKD.  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  7. Atrial fibrillation: Went into atrial fibrillation => flutter this admission on milrinone.  He is on heparin gtt and amiodarone gtt, HR 80s. Not on warfarin or NOAC yet  given potential procedures.  Will need to decide on DCCV => not sure he will maintain NSR if we are unable to wean off milrinone.  8. Hyponatremia: Fluid restrict, 1500 cc.  Increased tolvaptan to 30 mg daily.    Kyandra Mcclaine   06/04/2014 7:54 AM   

## 2014-06-09 NOTE — Interval H&P Note (Signed)
History and Physical Interval Note:  06/28/2014 3:00 PM  Luke Pittman  has presented today for surgery, with the diagnosis of chf  The various methods of treatment have been discussed with the patient and family. After consideration of risks, benefits and other options for treatment, the patient has consented to  Procedure(s): RIGHT HEART CATH (N/A) as a surgical intervention .  The patient's history has been reviewed, patient examined, no change in status, stable for surgery.  I have reviewed the patient's chart and labs.  Questions were answered to the patient's satisfaction.     Laquinton Bihm [employer]

## 2014-06-09 NOTE — CV Procedure (Signed)
    Cardiac Catheterization Procedure Note  Name: Luke Pittman MRN: [id number] DOB: 30-Apr-1945  Procedure: Right Heart Cath  Indication: CHF/cardiogenic shock.    Procedural Details: The left brachial area was sterilely prepped and draped.  There was a pre-existing peripheral IV in the left brachial area.  This was replaced by a 5 [place] sheath. A Swan-Ganz catheter was used for the right heart catheterization. Standard protocol was followed for recording of right heart pressures and sampling of oxygen saturations. Fick cardiac output was calculated.  There were no immediate procedural complications. The patient was transferred to the post catheterization recovery area for further monitoring.  Procedural Findings: Hemodynamics (mmHg) RA mean 14 RV 53/15 PA 55/21, mean 34 PCWP mean 25  Oxygen saturations: PA 61% AO 95%  Cardiac Output (Fick) 6.07  Cardiac Index (Fick) 3.19 PVR 1.5 WU   RA/PCWP 0.56  Final Conclusions:  There has been improvement in the RA pressure and cardiac output since the prior RHC on 3/1.  Still elevation in right and left heart filling pressures, will continue to diurese over weekend.  Follow creatinine closely.  Continue milrinone 0.5 mcg/kg/min.  Will discuss with team home with milrinone + transplant workup versus LVAD consideration.   Marca AnconaDalton Sofhia Ulibarri 2014/05/23, 3:30 PM

## 2014-06-09 NOTE — Progress Notes (Signed)
ANTICOAGULATION CONSULT NOTE - Follow Up Consult  Pharmacy Consult for heparin Indication: atrial fibrillation  Allergies  Allergen Reactions  . Beta Adrenergic Blockers Other (See Comments)    Swollen hands, Per patient currently ([id number](Jul2015) tolerating Coreg.  . Fish Oil Diarrhea  . Januvia [Sitagliptin Phosphate] Other (See Comments)    Leg cramps  . Tetanus-Diphtheria Toxoids Td Swelling    Patient Measurements: Height: 5\' 11"  (180.3 cm) Weight: 156 lb 15.5 oz (71.2 kg) IBW/kg (Calculated) : 75.3 Heparin Dosing Weight:   Vital Signs: Temp: 97.8 F (36.6 C) (03/11 1108) Temp Source: Oral (03/11 1108) BP: 97/59 mmHg (03/11 1108) Pulse Rate: 77 (03/11 1515)  Labs:  Recent Labs  06/07/14 0420 06/08/14 0515 06/24/2014 0345  HGB 9.4* 8.7* 9.0*  HCT 29.4* 26.4* 27.6*  PLT 143* 133* 130*  LABPROT 18.4* 18.1* 17.3*  INR 1.52* 1.49 1.40  HEPARINUNFRC 0.48 0.48 0.36  CREATININE 1.92* 2.23* 2.44*    Estimated Creatinine Clearance: 29.2 mL/min (by C-G formula based on Cr of 2.44).   Medications:  Scheduled:  . acetic acid-hydrocortisone  4 drop Right Ear QHS  . aspirin  81 mg Oral Daily  . ezetimibe  10 mg Oral Daily  . fluticasone  2 spray Each Nare QHS  . insulin aspart  0-15 Units Subcutaneous TID WC  . insulin aspart  0-5 Units Subcutaneous QHS  . insulin detemir  10 Units Subcutaneous QHS  . iron dextran (INFED/DEXFERRUM) infusion  25 mg Intravenous Once   Followed by  . iron dextran (INFED/DEXFERRUM) infusion  1,000 mg Intravenous Once  . lactulose  20 g Oral Daily  . Mega Benfotiamine Antioxidant Capsule  1 capsule Oral Daily  . nateglinide  60 mg Oral TID WC  . pantoprazole  40 mg Oral Q1200  . pravastatin  80 mg Oral q1800  . silver sulfADIAZINE   Topical BID  . sodium chloride  3 mL Intravenous Q12H  . sodium chloride  3 mL Intravenous Q12H  . spironolactone  12.5 mg Oral Daily  . tolvaptan  30 mg Oral Q24H  . warfarin   Does not apply Once    Infusions:  . sodium chloride Stopped (06/01/14 2000)  . amiodarone 30 mg/hr (06/10/2014 0543)  . furosemide (LASIX) infusion 20 mg/hr (06/08/2014 0738)  . heparin 1,250 Units/hr (06/13/2014 1200)  . milrinone 0.5 mcg/kg/min (06/03/2014 1100)    Assessment: 69 yo male with afib s/p cath will be resumed on heparin 2 hrs post sheath removal.  Per RN, sheath was removed at ~1545.  Patient was previously therapeutic on heparin @ 1250 units/hr.  Goal of Therapy:  Heparin level 0.3-0.7 units/ml Monitor platelets by anticoagulation protocol: Yes   Plan:  - Start heparin back @ 1250 units/hr @ 1745 today. No bolus - 8hrs heparin level  Rabia Argote, Tsz-Yin 06/15/2014,3:53 PM

## 2014-06-09 NOTE — H&P (View-Only) (Signed)
Patient ID: MAXIE SLOVACEK, male   DOB: [birth year], 69 y.o.   MRN: [id number]   SUBJECTIVE:   Milrinone gtt now at 0.5.  Diuresis better but creatinine at 2.4.  Got tolvaptan, yesterday, sodium still 121 today.  Co-ox still low at 54%. CVP better today, 14-15.   Denies SOB, walking in halls during the day.   ECHO EF 20% mild LVH  RV mod-severely dilated with moderate-severe systolic dysfunction .   RHC: 24-Jun-2014.  RA mean 22 RV 57/24 PA 61/29, mean 43 PCWP mean 29 Oxygen saturations: PA 34% AO 95% Cardiac Output (Fick) 3.24  Cardiac Index (Fick) 1.64 PVR 4.3 WU Cardiac Output (Thermo) 3.23 Cardiac Index (Thermo) 1.63  Scheduled Meds: . acetic acid-hydrocortisone  4 drop Right Ear QHS  . aspirin  81 mg Oral Daily  . ezetimibe  10 mg Oral Daily  . fluticasone  2 spray Each Nare QHS  . insulin aspart  0-15 Units Subcutaneous TID WC  . insulin aspart  0-5 Units Subcutaneous QHS  . insulin detemir  10 Units Subcutaneous QHS  . lactulose  20 g Oral Daily  . Mega Benfotiamine Antioxidant Capsule  1 capsule Oral Daily  . metolazone  5 mg Oral Once  . nateglinide  60 mg Oral TID WC  . pantoprazole  40 mg Oral Q1200  . potassium chloride  40 mEq Oral Once  . pravastatin  80 mg Oral q1800  . silver sulfADIAZINE   Topical BID  . spironolactone  12.5 mg Oral Daily  . tolvaptan  30 mg Oral Q24H  . warfarin   Does not apply Once   Continuous Infusions: . sodium chloride Stopped (06/01/14 2000)  . amiodarone 30 mg/hr (06/27/2014 0543)  . furosemide (LASIX) infusion 20 mg/hr (05/31/2014 0738)  . heparin 1,250 Units/hr (06/08/14 1400)  . milrinone 0.5 mcg/kg/min (06/19/2014 0229)   PRN Meds:.acetaminophen, HYDROcodone-acetaminophen, MUSCLE RUB, ondansetron (ZOFRAN) IV, sodium chloride   Filed Vitals:   06/08/14 2300 06/28/2014 0233 06/23/2014 0408 06/02/2014 0736  BP: 106/65  101/57 107/59  Pulse: 87  87   Temp: 97.6 F (36.4 C)  97.9 F (36.6 C) 98.2 F (36.8 C)  TempSrc: Oral  Oral  Oral  Resp: Height:      Weight:  156 lb 15.5 oz (71.2 kg)    SpO2: 98%  97% 96%    Intake/Output Summary (Last 24 hours) at 06/28/2014 0754 Last data filed at 06/26/2014 0735  Gross per 24 hour  Intake   2123 ml  Output   4050 ml  Net  -1927 ml    LABS: Basic Metabolic Panel:  Recent Labs  40/98/11 0515 06/06/2014 0345  NA 121* 121*  K 3.9 3.7  CL 83* 81*  CO2 28 27  GLUCOSE 224* 154*  BUN 50* 57*  CREATININE 2.23* 2.44*  CALCIUM 8.5 9.0   Liver Function Tests: No results for input(s): AST, ALT, ALKPHOS, BILITOT, PROT, ALBUMIN in the last 72 hours. No results for input(s): LIPASE, AMYLASE in the last 72 hours. CBC:  Recent Labs  06/08/14 0515 06/01/2014 0345  WBC 5.9 6.0  HGB 8.7* 9.0*  HCT 26.4* 27.6*  MCV 70.8* 72.3*  PLT 133* 130*   Cardiac Enzymes: No results for input(s): CKTOTAL, CKMB, CKMBINDEX, TROPONINI in the last 72 hours. BNP: Invalid input(s): POCBNP D-Dimer: No results for input(s): DDIMER in the last 72 hours. Hemoglobin A1C: No results for input(s): HGBA1C in the last 72 hours. Fasting Lipid  Panel: No results for input(s): CHOL, HDL, LDLCALC, TRIG, CHOLHDL, LDLDIRECT in the last 72 hours. Thyroid Function Tests: No results for input(s): TSH, T4TOTAL, T3FREE, THYROIDAB in the last 72 hours.  Invalid input(s): FREET3 Anemia Panel: No results for input(s): VITAMINB12, FOLATE, FERRITIN, TIBC, IRON, RETICCTPCT in the last 72 hours.  RADIOLOGY: Ct Abdomen Pelvis Wo Contrast  06/05/2014   CLINICAL DATA:  69 year old with cardiomyopathy. Preop evaluation prior to placement of left ventricular assist device.  EXAM: CT ABDOMEN AND PELVIS WITHOUT CONTRAST  TECHNIQUE: Multidetector CT imaging of the abdomen and pelvis was performed following the standard protocol without IV contrast.  COMPARISON:  Chest CT 06/04/2014  FINDINGS: Again noted is a small left pleural effusion. There is cardiomegaly without significant pericardial fluid. No evidence  for free intraperitoneal air.  Unenhanced CT was performed per clinician order. Lack of IV contrast limits sensitivity and specificity, especially for evaluation of abdominal/pelvic solid viscera.  There is subcutaneous edema. There is a moderate amount of ascites around the liver and within the pelvis. Small amount of ascites around the spleen. No gross abnormality to the liver, gallbladder or spleen. Spleen size is normal. Normal appearance of the pancreas, stomach and adrenal glands. Normal appearance of the kidneys, urinary bladder and prostate.  Atherosclerotic calcifications in the abdominal aorta without aneurysm. No significant lymphadenopathy involving the abdomen or pelvis. Normal appearance of the small bowel, large bowel and appendix. Mild disc space loss at L5-S1. Mild facet arthropathy in the lower lumbar spine.  IMPRESSION: Small-to-moderate amount of ascites. Subcutaneous edema and a small left pleural effusion.   Electronically Signed   By: Richarda Overlie M.D.   On: 06/05/2014 12:42   Dg Orthopantogram  06/05/2014   CLINICAL DATA:  Preoperative evaluation  EXAM: ORTHOPANTOGRAM/PANORAMIC  COMPARISON:  None  FINDINGS: Dental appliances present.  Multiple prior dental RIGHT extractions with dental amalgam at multiple remaining teeth.  No mandibular fracture or bone destruction identified.  IMPRESSION: No acute mandibular abnormalities.   Electronically Signed   By: Ulyses Southward M.D.   On: 06/05/2014 12:10   Dg Chest 2 View  05/04/2014   CLINICAL DATA:  Congestive failure  EXAM: CHEST  2 VIEW  COMPARISON:  10/03/2013  FINDINGS: Cardiac shadow remains enlarged. Postsurgical changes are again seen. No vascular congestion is seen. Very minimal effusions are noted bilaterally. No focal infiltrate is noted. No bony abnormality is seen.  IMPRESSION: Small effusions.  No other significant abnormality is noted.   Electronically Signed   By: Alcide Clever M.D.   On: 05/15/2014 13:44   Ct Chest Wo  Contrast  06/04/2014   CLINICAL DATA:  Shortness of breath, history of heart failure  EXAM: CT CHEST WITHOUT CONTRAST  TECHNIQUE: Multidetector CT imaging of the chest was performed following the standard protocol without IV contrast.  COMPARISON:  05/31/2014  FINDINGS: Mild peribronchial wall thickening. Mild interstitial change with a few Kerley B-lines bilaterally. No alveolar opacification. Small bilateral pleural effusions larger on the left. No significant pericardial effusion.  Severe cardiac enlargement and coronary artery calcification. Mild thoracic aortic calcification.  Right PICC line tip extends to the cavoatrial junction.  Images through the upper abdomen show a small volume of ascites.  No acute musculoskeletal findings. No significant hilar or mediastinal adenopathy.  IMPRESSION: Findings consistent with congestive heart failure with mild interstitial pulmonary edema.   Electronically Signed   By: Esperanza Heir M.D.   On: 06/04/2014 16:08   US Abdomen Complete  05/03/2014  CLINICAL DATA:  Increased liver function test. History of diabetes, chronic kidney disease and patient is taking Cymbalta  EXAM: ULTRASOUND ABDOMEN COMPLETE  COMPARISON:  None.  FINDINGS: Gallbladder: Cholelithiasis without pericholecystic fluid. Gallbladder wall wall thickening measuring 5.6 mm. Negative sonographic Murphy sign.  Common bile duct: Diameter: 2.5 mm  Liver: No focal lesion identified. Within normal limits in parenchymal echogenicity.  IVC: No abnormality visualized.  Pancreas: Limited visualization secondary to overlying bowel gas.  Spleen: Size and appearance within normal limits.  Right Kidney: Length: 11.5 cm. Echogenicity within normal limits. No mass or hydronephrosis visualized.  Left Kidney: Length: 11.6 cm. Echogenicity within normal limits. No mass or hydronephrosis visualized.  Abdominal aorta: No aneurysm visualized.  Other findings: There is a small amount of ascites. There are bilateral pleural  effusions.  IMPRESSION: 1. Cholelithiasis. Gallbladder wall thickening which may be secondary to intrinsic gallbladder wall abnormality suggest cholecystitis, but can also be seen in the setting of ascites and hepatocellular disease versus hypoalbuminemia versus fluid overload. The lateral etiologies are favored over cholecystitis. 2. Small amount of ascites. 3. Bilateral pleural effusions.   Electronically Signed   By: Elige Ko   On: 05/09/2014 14:56   Dg Chest Port 1 View  05/31/2014   CLINICAL DATA:  PICC line placement  EXAM: PORTABLE CHEST - 1 VIEW  COMPARISON:  05/13/2014  FINDINGS: Right-sided PICC line with the tip projecting over the cavoatrial junction. No focal consolidation or pneumothorax. Trace left pleural effusion. There is stable cardiomegaly. There is evidence of prior CABG.  Mild osteoarthritis of bilateral glenohumeral joints.  IMPRESSION: Right-sided PICC line with the tip projecting over the cavoatrial junction.   Electronically Signed   By: Elige Ko   On: 05/31/2014 16:43    PHYSICAL EXAM CVP 14-15 General: NAD Sitting on the side of the bed.  Neck: JVP 14, no thyromegaly or thyroid nodule.  Lungs: Decreased breath sounds at bases.  CV: Nondisplaced PMI.  Heart mildly tachy, regular S1/S2, no S3/S4, no murmur.  1+ ankle edema.  Abdomen: Soft, nontender, no hepatosplenomegaly, mildly distended  Psych: Normal affect. Extremities: No clubbing or cyanosis.    TELEMETRY: Reviewed telemetry pt in atrial flutter in 80s.   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  1. Acute on chronic systolic CHF: EF 16% with at least moderate RV dysfunction by echo. He does not have an ICD (has not wanted in past).  He presented with NYHA class IIIb symptoms (dyspnea and profound fatigue), poor appetite, and markedly elevated transaminases. He remains volume overloaded on exam. BP is stable. RHC showed elevated filling  pressures and low output.  On RHC, RA/PCWP = 0.75.  Milrinone gtt was begun 3/2 and he felt much better/more alert.  Milrinone turned down to 0.125 on 3/3 with fall in co-ox.  Milrinone back up to 0.25, co-ox 61% with CVP 22.  3/6 milrinone was increased to 0.375 mcg then up to 0.5 on 3/10.  Co-ox still low at 54% with CVP 14-15 (some improvement).  However, creatinine has risen. -Continue milrinone at 0.5.   - RHC today to formally assess filling pressures and cardiac output.  - No beta blocker other than amiodarone given low output.   - Off lisinopril with hyperkalemia/elevated creatinine.  Also stopped digoxin for now.   - Continue Lasix gtt 20 mg/hr, will give metolazone once today for now with creatinine rise.  - I am going to give him  tolvaptan again today with hyponatremia, 30 mg.    -  He has seen LVAD coordinator.  He will be a difficult candidate for LVAD due to RV dysfunction, but he will also be a difficult candidate for home inotropes given arrhythmias. ?Milrinone as bridge to transplant.  If he goes home on milrinone, will need Lifevest.  CVP better today but still high, will do RHC.  Will need GI workup prior to discharge if we are moving towards LVAD.  2. AKI: Cardiorenal syndrome.  Creatinine worse today.  Plan RHC.     3. CAD: S/p CABG. Cath in 2013 with patent grafts. No chest pain. Continue ASA, LFTs back down so restarted statin.   4. Elevated transaminases: Suspect hepatic congestion from CHF. He is clearly volume overloaded. LFTs coming down with diuresis, abdominal US showed normal liver echotexture. 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   5. Diabetes: added nateglinide per diabetes coordinator.  Not good candidate for metformin in the future with CKD.  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  7. Atrial fibrillation: Went into atrial fibrillation => flutter this admission on milrinone.  He is on heparin gtt and amiodarone gtt, HR 80s. Not on warfarin or NOAC yet  given potential procedures.  Will need to decide on DCCV => not sure he will maintain NSR if we are unable to wean off milrinone.  8. Hyponatremia: Fluid restrict, 1500 cc.  Increased tolvaptan to 30 mg daily.    Marca AnconaDalton Jazmina Muhlenkamp   06/06/2014 7:54 AM

## 2014-06-09 NOTE — Progress Notes (Signed)
CARDIAC REHAB PHASE I   PRE:  Rate/Rhythm: 88 afib with PVCs    BP: sitting 107/44    SaO2:   MODE:  Ambulation: 950 ft   POST:  Rate/Rhythm: 107 afib with PVCs    BP: sitting 92/46     SaO2:   Tolerated fairly well. Seems to be somewhat drowsy today, slightly slow to answer questions appropriately. No major c/o. Worried that his feet will bother him in cath and asking for pain meds.  [id number]  Elissa LovettReeve, Yoshito Gaza HarrodsburgKristan CES, ACSM 06/06/2014 1:48 PM

## 2014-06-09 NOTE — Progress Notes (Signed)
ANTICOAGULATION CONSULT NOTE - Follow Up Consult  Pharmacy Consult for Heparin Indication: atrial fibrillation  Allergies  Allergen Reactions  . Beta Adrenergic Blockers Other (See Comments)    Swollen hands, Per patient currently ([id number](Jul2015) tolerating Coreg.  . Fish Oil Diarrhea  . Januvia [Sitagliptin Phosphate] Other (See Comments)    Leg cramps  . Tetanus-Diphtheria Toxoids Td Swelling    Patient Measurements: Height: 5\' 11"  (180.3 cm) Weight: 156 lb 15.5 oz (71.2 kg) IBW/kg (Calculated) : 75.3  Vital Signs: Temp: 97.8 F (36.6 C) (03/11 1108) Temp Source: Oral (03/11 1108) BP: 97/59 mmHg (03/11 1108) Pulse Rate: 87 (03/11 0408)  Labs:  Recent Labs  06/07/14 0420 06/08/14 0515 06/03/2014 0345  HGB 9.4* 8.7* 9.0*  HCT 29.4* 26.4* 27.6*  PLT 143* 133* 130*  LABPROT 18.4* 18.1* 17.3*  INR 1.52* 1.49 1.40  HEPARINUNFRC 0.48 0.48 0.36  CREATININE 1.92* 2.23* 2.44*    Estimated Creatinine Clearance: 29.2 mL/min (by C-G formula based on Cr of 2.44).   Medications:  Heparin @ 1250 units/hr  Assessment: 68yom on coumadin pta for afib, admitted with CHF exacerbation and now undergoing LVAD workup. Coumadin placed on hold and he was started on a heparin bridge. Heparin level remains therapeutic. CBC stable. No bleeding reported.  Goal of Therapy:  Heparin level 0.3-0.7 units/ml Monitor platelets by anticoagulation protocol: Yes   Plan:  - Continue heparin at 1250 units/hr - Monitor daily heparin level, CBC, s/sx of bleeding - Follow up long term anticoag plans  Waynette Butteryegan K. Donisha Hoch, PharmD Clinical Pharmacy Resident Pager: [phone] 05/30/2014 12:15 PM

## 2014-06-10 ENCOUNTER — Encounter (HOSPITAL_COMMUNITY): Payer: Self-pay | Admitting: Cardiology

## 2014-06-10 DIAGNOSIS — N189 Chronic kidney disease, unspecified: Secondary | ICD-10-CM

## 2014-06-10 LAB — CBC
HCT: 26.6 % — ABNORMAL LOW (ref 39.0–52.0)
Hemoglobin: 9 g/dL — ABNORMAL LOW (ref 13.0–17.0)
MCH: 23.7 pg — ABNORMAL LOW (ref 26.0–34.0)
MCHC: 33.8 g/dL (ref 30.0–36.0)
MCV: 70 fL — AB (ref 78.0–100.0)
Platelets: 117 10*3/uL — ABNORMAL LOW (ref 150–400)
RBC: 3.8 MIL/uL — ABNORMAL LOW (ref 4.22–5.81)
RDW: 18.2 % — ABNORMAL HIGH (ref 11.5–15.5)
WBC: 5.3 10*3/uL (ref 4.0–10.5)

## 2014-06-10 LAB — COMPREHENSIVE METABOLIC PANEL
ALT: 34 U/L (ref 0–53)
ANION GAP: 11 (ref 5–15)
AST: 23 U/L (ref 0–37)
Albumin: 3.4 g/dL — ABNORMAL LOW (ref 3.5–5.2)
Alkaline Phosphatase: 107 U/L (ref 39–117)
BUN: 61 mg/dL — AB (ref 6–23)
CHLORIDE: 83 mmol/L — AB (ref 96–112)
CO2: 28 mmol/L (ref 19–32)
CREATININE: 2.61 mg/dL — AB (ref 0.50–1.35)
Calcium: 8.8 mg/dL (ref 8.4–10.5)
GFR calc Af Amer: 27 mL/min — ABNORMAL LOW (ref >90)
GFR, EST NON AFRICAN AMERICAN: 24 mL/min — AB (ref >90)
GLUCOSE: 327 mg/dL — AB (ref 70–99)
POTASSIUM: 3.9 mmol/L (ref 3.5–5.1)
SODIUM: 122 mmol/L — AB (ref 135–145)
Total Bilirubin: 2 mg/dL — ABNORMAL HIGH (ref 0.3–1.2)
Total Protein: 6.6 g/dL (ref 6.0–8.3)

## 2014-06-10 LAB — GLUCOSE, CAPILLARY
GLUCOSE-CAPILLARY: 262 mg/dL — AB (ref 70–99)
GLUCOSE-CAPILLARY: 105 mg/dL — AB (ref 70–99)
GLUCOSE-CAPILLARY: 189 mg/dL — AB (ref 70–99)
Glucose-Capillary: 214 mg/dL — ABNORMAL HIGH (ref 70–99)

## 2014-06-10 LAB — HEPARIN LEVEL (UNFRACTIONATED)
Heparin Unfractionated: 0.17 IU/mL — ABNORMAL LOW (ref 0.30–0.70)
Heparin Unfractionated: 0.38 IU/mL (ref 0.30–0.70)

## 2014-06-10 LAB — CARBOXYHEMOGLOBIN
CARBOXYHEMOGLOBIN: 1.6 % — AB (ref 0.5–1.5)
METHEMOGLOBIN: 1.6 % — AB (ref 0.0–1.5)
O2 Saturation: 59.3 %
Total hemoglobin: 9.2 g/dL — ABNORMAL LOW (ref 13.5–18.0)

## 2014-06-10 LAB — PROTIME-INR
INR: 1.3 (ref 0.00–1.49)
Prothrombin Time: 16.3 seconds — ABNORMAL HIGH (ref 11.6–15.2)

## 2014-06-10 MED ORDER — HEPARIN (PORCINE) IN NACL 100-0.45 UNIT/ML-% IJ SOLN
1350.0000 [IU]/h | INTRAMUSCULAR | Status: DC
  Administered 2014-06-10 – 2014-06-13 (×6): 1350 [IU]/h via INTRAVENOUS
  Filled 2014-06-10 (×10): qty 250

## 2014-06-10 NOTE — Progress Notes (Signed)
ANTICOAGULATION CONSULT NOTE - Follow Up Consult  Pharmacy Consult for heparin Indication: atrial fibrillation  Allergies  Allergen Reactions  . Beta Adrenergic Blockers Other (See Comments)    Swollen hands, Per patient currently ([id number](Jul2015) tolerating Coreg.  . Fish Oil Diarrhea  . Januvia [Sitagliptin Phosphate] Other (See Comments)    Leg cramps  . Tetanus-Diphtheria Toxoids Td Swelling    Patient Measurements: Height: 5\' 11"  (180.3 cm) Weight: 156 lb (70.761 kg) IBW/kg (Calculated) : 75.3 Heparin Dosing Weight: 70.7 kg  Vital Signs: Temp: 98.3 F (36.8 C) (03/12 0300) Temp Source: Oral (03/12 0300) BP: 110/60 mmHg (03/12 0300) Pulse Rate: 101 (03/12 0300)  Labs:  Recent Labs  06/07/14 0420 06/08/14 0515 06/08/2014 0345 06/10/14 0135  HGB 9.4* 8.7* 9.0*  --   HCT 29.4* 26.4* 27.6*  --   PLT 143* 133* 130*  --   LABPROT 18.4* 18.1* 17.3* 16.3*  INR 1.52* 1.49 1.40 1.30  HEPARINUNFRC 0.48 0.48 0.36 0.17*  CREATININE 1.92* 2.23* 2.44*  --     Estimated Creatinine Clearance: 29 mL/min (by C-G formula based on Cr of 2.44).   Medications:  Scheduled:  . acetic acid-hydrocortisone  4 drop Right Ear QHS  . aspirin  81 mg Oral Daily  . ezetimibe  10 mg Oral Daily  . fluticasone  2 spray Each Nare QHS  . insulin aspart  0-15 Units Subcutaneous TID WC  . insulin aspart  0-5 Units Subcutaneous QHS  . insulin detemir  10 Units Subcutaneous QHS  . lactulose  20 g Oral Daily  . Mega Benfotiamine Antioxidant Capsule  1 capsule Oral Daily  . nateglinide  60 mg Oral TID WC  . pantoprazole  40 mg Oral Q1200  . pravastatin  80 mg Oral q1800  . silver sulfADIAZINE   Topical BID  . sodium chloride  3 mL Intravenous Q12H  . sodium chloride  3 mL Intravenous Q12H  . spironolactone  12.5 mg Oral Daily  . tolvaptan  30 mg Oral Q24H  . warfarin   Does not apply Once   Infusions:  . sodium chloride Stopped (06/01/14 2000)  . amiodarone 30 mg/hr (06/11/2014 1743)  .  furosemide (LASIX) infusion 20 mg/hr (06/22/2014 2144)  . heparin 1,250 Units/hr (06/25/2014 1743)  . milrinone 0.5 mcg/kg/min (06/05/2014 1950)    Assessment: 8 hour heparin level = 0.17 after heparin resumed at 1250 units/hr post cath 2 hrs post sheath removal.  Patient was previously therapeutic on heparin @ 1250 units/hr.  No problem with IV line/infusion per RN. RN noted one small nose bleed last pm-resolved, no other  bleeding noted.   Heparin level 0.3-0.7 units/ml Monitor platelets by anticoagulation protocol: Yes   Plan:  Increase heparin rate to 1350 units/hr  6hr heparin level  Noah Delaineuth Emanuela Runnion, RPh Clinical Pharmacist Pager: [phone] 06/10/2014,3:29 AM

## 2014-06-10 NOTE — Progress Notes (Signed)
Patient ID: Luke Pittman, male   DOB: 04/19/1945, 69 y.o.   MRN: [id number]   SUBJECTIVE:   RHC yesterday with improved hemodynamics. Milrinone gtt now at 0.5. Lasix and tolvaptan stopped this am due to rising creatinine.   ECHO EF 20% mild LVH  RV mod-severely dilated with moderate-severe systolic dysfunction . CVP 17.    RHC 3/10 RA mean 14 RV 53/15 PA 55/21, mean 34 PCWP mean 25 Oxygen saturations: PA 61% AO 95% Cardiac Output (Fick) 6.07  Cardiac Index (Fick) 3.19 PVR 1.5 WU  Scheduled Meds: . acetic acid-hydrocortisone  4 drop Right Ear QHS  . aspirin  81 mg Oral Daily  . ezetimibe  10 mg Oral Daily  . fluticasone  2 spray Each Nare QHS  . insulin aspart  0-15 Units Subcutaneous TID WC  . insulin aspart  0-5 Units Subcutaneous QHS  . insulin detemir  10 Units Subcutaneous QHS  . lactulose  20 g Oral Daily  . Mega Benfotiamine Antioxidant Capsule  1 capsule Oral Daily  . nateglinide  60 mg Oral TID WC  . pantoprazole  40 mg Oral Q1200  . pravastatin  80 mg Oral q1800  . silver sulfADIAZINE   Topical BID  . sodium chloride  3 mL Intravenous Q12H  . sodium chloride  3 mL Intravenous Q12H  . spironolactone  12.5 mg Oral Daily  . warfarin   Does not apply Once   Continuous Infusions: . sodium chloride Stopped (06/01/14 2000)  . amiodarone 30 mg/hr (06/10/14 0800)  . heparin 1,350 Units/hr (06/10/14 1102)  . milrinone 0.5 mcg/kg/min (06/10/14 1342)   PRN Meds:.sodium chloride, sodium chloride, acetaminophen, HYDROcodone-acetaminophen, MUSCLE RUB, ondansetron (ZOFRAN) IV, sodium chloride, sodium chloride, sodium chloride   Filed Vitals:   06/10/14 0300 06/10/14 0755 06/10/14 0801 06/10/14 1215  BP: 110/60 150/75  136/78  Pulse: 101  98   Temp: 98.3 F (36.8 C)  97.7 F (36.5 C) 98.6 F (37 C)  TempSrc: Oral  Oral Oral  Resp: 18 18 18 18   Height:      Weight:      SpO2: 95%  94% 95%    Intake/Output Summary (Last 24 hours) at 06/10/14 1403 Last data  filed at 06/10/14 1300  Gross per 24 hour  Intake 2679.33 ml  Output   3100 ml  Net -420.67 ml    LABS: Basic Metabolic Panel:  Recent Labs  40/98/11 0345 06/10/14 0340  NA 121* 122*  K 3.7 3.9  CL 81* 83*  CO2 27 28  GLUCOSE 154* 327*  BUN 57* 61*  CREATININE 2.44* 2.61*  CALCIUM 9.0 8.8   Liver Function Tests:  Recent Labs  06/10/14 0340  AST 23  ALT 34  ALKPHOS 107  BILITOT 2.0*  PROT 6.6  ALBUMIN 3.4*   No results for input(s): LIPASE, AMYLASE in the last 72 hours. CBC:  Recent Labs  06/19/2014 0345 06/10/14 0340  WBC 6.0 5.3  HGB 9.0* 9.0*  HCT 27.6* 26.6*  MCV 72.3* 70.0*  PLT 130* 117*   Cardiac Enzymes: No results for input(s): CKTOTAL, CKMB, CKMBINDEX, TROPONINI in the last 72 hours. BNP: Invalid input(s): POCBNP D-Dimer: No results for input(s): DDIMER in the last 72 hours. Hemoglobin A1C: No results for input(s): HGBA1C in the last 72 hours. Fasting Lipid Panel: No results for input(s): CHOL, HDL, LDLCALC, TRIG, CHOLHDL, LDLDIRECT in the last 72 hours. Thyroid Function Tests: No results for input(s): TSH, T4TOTAL, T3FREE, THYROIDAB in the last 72  hours.  Invalid input(s): FREET3 Anemia Panel: No results for input(s): VITAMINB12, FOLATE, FERRITIN, TIBC, IRON, RETICCTPCT in the last 72 hours.  RADIOLOGY: Ct Abdomen Pelvis Wo Contrast  06/05/2014   CLINICAL DATA:  69 year old with cardiomyopathy. Preop evaluation prior to placement of left ventricular assist device.  EXAM: CT ABDOMEN AND PELVIS WITHOUT CONTRAST  TECHNIQUE: Multidetector CT imaging of the abdomen and pelvis was performed following the standard protocol without IV contrast.  COMPARISON:  Chest CT 06/04/2014  FINDINGS: Again noted is a small left pleural effusion. There is cardiomegaly without significant pericardial fluid. No evidence for free intraperitoneal air.  Unenhanced CT was performed per clinician order. Lack of IV contrast limits sensitivity and specificity, especially  for evaluation of abdominal/pelvic solid viscera.  There is subcutaneous edema. There is a moderate amount of ascites around the liver and within the pelvis. Small amount of ascites around the spleen. No gross abnormality to the liver, gallbladder or spleen. Spleen size is normal. Normal appearance of the pancreas, stomach and adrenal glands. Normal appearance of the kidneys, urinary bladder and prostate.  Atherosclerotic calcifications in the abdominal aorta without aneurysm. No significant lymphadenopathy involving the abdomen or pelvis. Normal appearance of the small bowel, large bowel and appendix. Mild disc space loss at L5-S1. Mild facet arthropathy in the lower lumbar spine.  IMPRESSION: Small-to-moderate amount of ascites. Subcutaneous edema and a small left pleural effusion.   Electronically Signed   By: Richarda Overlie M.D.   On: 06/05/2014 12:42   Dg Orthopantogram  06/05/2014   CLINICAL DATA:  Preoperative evaluation  EXAM: ORTHOPANTOGRAM/PANORAMIC  COMPARISON:  None  FINDINGS: Dental appliances present.  Multiple prior dental RIGHT extractions with dental amalgam at multiple remaining teeth.  No mandibular fracture or bone destruction identified.  IMPRESSION: No acute mandibular abnormalities.   Electronically Signed   By: Ulyses Southward M.D.   On: 06/05/2014 12:10   Dg Chest 2 View  05/26/2014   CLINICAL DATA:  Congestive failure  EXAM: CHEST  2 VIEW  COMPARISON:  10/03/2013  FINDINGS: Cardiac shadow remains enlarged. Postsurgical changes are again seen. No vascular congestion is seen. Very minimal effusions are noted bilaterally. No focal infiltrate is noted. No bony abnormality is seen.  IMPRESSION: Small effusions.  No other significant abnormality is noted.   Electronically Signed   By: Alcide Clever M.D.   On: 05/11/2014 13:44   Ct Chest Wo Contrast  06/04/2014   CLINICAL DATA:  Shortness of breath, history of heart failure  EXAM: CT CHEST WITHOUT CONTRAST  TECHNIQUE: Multidetector CT imaging of the  chest was performed following the standard protocol without IV contrast.  COMPARISON:  05/31/2014  FINDINGS: Mild peribronchial wall thickening. Mild interstitial change with a few Kerley B-lines bilaterally. No alveolar opacification. Small bilateral pleural effusions larger on the left. No significant pericardial effusion.  Severe cardiac enlargement and coronary artery calcification. Mild thoracic aortic calcification.  Right PICC line tip extends to the cavoatrial junction.  Images through the upper abdomen show a small volume of ascites.  No acute musculoskeletal findings. No significant hilar or mediastinal adenopathy.  IMPRESSION: Findings consistent with congestive heart failure with mild interstitial pulmonary edema.   Electronically Signed   By: Esperanza Heir M.D.   On: 06/04/2014 16:08   US Abdomen Complete  05/26/2014   CLINICAL DATA:  Increased liver function test. History of diabetes, chronic kidney disease and patient is taking Cymbalta  EXAM: ULTRASOUND ABDOMEN COMPLETE  COMPARISON:  None.  FINDINGS: Gallbladder:  Cholelithiasis without pericholecystic fluid. Gallbladder wall wall thickening measuring 5.6 mm. Negative sonographic Murphy sign.  Common bile duct: Diameter: 2.5 mm  Liver: No focal lesion identified. Within normal limits in parenchymal echogenicity.  IVC: No abnormality visualized.  Pancreas: Limited visualization secondary to overlying bowel gas.  Spleen: Size and appearance within normal limits.  Right Kidney: Length: 11.5 cm. Echogenicity within normal limits. No mass or hydronephrosis visualized.  Left Kidney: Length: 11.6 cm. Echogenicity within normal limits. No mass or hydronephrosis visualized.  Abdominal aorta: No aneurysm visualized.  Other findings: There is a small amount of ascites. There are bilateral pleural effusions.  IMPRESSION: 1. Cholelithiasis. Gallbladder wall thickening which may be secondary to intrinsic gallbladder wall abnormality suggest cholecystitis, but  can also be seen in the setting of ascites and hepatocellular disease versus hypoalbuminemia versus fluid overload. The lateral etiologies are favored over cholecystitis. 2. Small amount of ascites. 3. Bilateral pleural effusions.   Electronically Signed   By: Elige Ko   On: June 19, 2014 14:56   Dg Chest Port 1 View  05/31/2014   CLINICAL DATA:  PICC line placement  EXAM: PORTABLE CHEST - 1 VIEW  COMPARISON:  06/19/14  FINDINGS: Right-sided PICC line with the tip projecting over the cavoatrial junction. No focal consolidation or pneumothorax. Trace left pleural effusion. There is stable cardiomegaly. There is evidence of prior CABG.  Mild osteoarthritis of bilateral glenohumeral joints.  IMPRESSION: Right-sided PICC line with the tip projecting over the cavoatrial junction.   Electronically Signed   By: Elige Ko   On: 05/31/2014 16:43    PHYSICAL EXAM CVP 17 General: NAD Sitting on the side of the bed.  Neck: JVP to jaw no thyromegaly or thyroid nodule.  Lungs: Decreased breath sounds at bases.  CV: Nondisplaced PMI.  Heart mildly tachy, regular S1/S2, no S3/S4, no murmur.  Tr-1+ ankle edema.  Abdomen: Soft, nontender, no hepatosplenomegaly, mildly distended  Psych: Normal affect. Extremities: No clubbing or cyanosis.    TELEMETRY: Reviewed telemetry pt in atrial flutter in 80s.   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  1. Acute on chronic systolic CHF: EF 16% with at least moderate RV dysfunction by echo. He does not have an ICD (has not wanted in past).  He presented with NYHA class IIIb symptoms (dyspnea and profound fatigue), poor appetite, and markedly elevated transaminases. He remains volume overloaded on exam. BP is stable. RHC showed elevated filling pressures and low output.  On initial RHC, RA/PCWP = 0.75.  Milrinone gtt was begun 3/2 and he felt much better/more alert.  Milrinone turned down to 0.125 on 3/3  with fall in co-ox.  Milrinone back up to 0.25, co-ox 61% with CVP 22.  3/6 milrinone was increased to 0.375 mcg then up to 0.5 on 3/10.  Co-ox still low at 54% with CVP 14-15 (some improvement).  However, creatinine has risen. -Continue milrinone at 0.5.   - No beta blocker other than amiodarone given low output.   - Off lisinopril with hyperkalemia/elevated creatinine.  Also stopped digoxin for now.   - Diuretics stopped due to rising creatinine 3/12 -  He has seen LVAD coordinator.  He will be a difficult candidate for LVAD due to RV dysfunction and CKD. ?Milrinone as bridge to transplant.  If he goes home on milrinone, will need Lifevest. Will need GI workup prior to discharge if we are moving towards LVAD.  2. AKI: Cardiorenal syndrome.  Creatinine worse today. Lasix stopped 3. CAD: S/p CABG. Cath in 2013 with patent grafts. No chest pain. Continue ASA, LFTs back down so restarted statin.   4. Elevated transaminases: Suspect hepatic congestion from CHF.Abdominal US showed normal liver echotexture. 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   5. Diabetes: added nateglinide per diabetes coordinator.  Not good candidate for metformin in the future with CKD/advancedHF.  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  7. Atrial fibrillation: Went into atrial fibrillation => flutter this admission on milrinone.  He is on heparin gtt and amiodarone gtt, HR 80s. Not on warfarin or NOAC yet given potential procedures.  Will need to decide on DCCV => not sure he will maintain NSR if we are unable to wean off milrinone.  8. Hyponatremia: Fluid restrict, 1500 cc.   It looks like this is about as good as we are going to get him. With renal failure and RV failure, home milrinone may be best option.   Arvilla Meresaniel Ronte Parker  MD 06/10/2014 2:03 PM

## 2014-06-10 NOTE — Progress Notes (Signed)
ANTICOAGULATION CONSULT NOTE - Follow Up Consult  Pharmacy Consult for heparin Indication: atrial fibrillation  Allergies  Allergen Reactions  . Beta Adrenergic Blockers Other (See Comments)    Swollen hands, Per patient currently ([id number](Jul2015) tolerating Coreg.  . Fish Oil Diarrhea  . Januvia [Sitagliptin Phosphate] Other (See Comments)    Leg cramps  . Tetanus-Diphtheria Toxoids Td Swelling    Patient Measurements: Height: 5\' 11"  (180.3 cm) Weight: 156 lb (70.761 kg) IBW/kg (Calculated) : 75.3 Heparin Dosing Weight: 70.7 kg  Vital Signs: Temp: 97.7 F (36.5 C) (03/12 0801) Temp Source: Oral (03/12 0801) BP: 150/75 mmHg (03/12 0755) Pulse Rate: 98 (03/12 0801)  Labs:  Recent Labs  06/08/14 0515 06/05/2014 0345 06/10/14 0135 06/10/14 0340 06/10/14 0945  HGB 8.7* 9.0*  --  9.0*  --   HCT 26.4* 27.6*  --  26.6*  --   PLT 133* 130*  --  117*  --   LABPROT 18.1* 17.3* 16.3*  --   --   INR 1.49 1.40 1.30  --   --   HEPARINUNFRC 0.48 0.36 0.17*  --  0.38  CREATININE 2.23* 2.44*  --  2.61*  --     Estimated Creatinine Clearance: 27.1 mL/min (by C-G formula based on Cr of 2.61).   Medications:  Scheduled:  . acetic acid-hydrocortisone  4 drop Right Ear QHS  . aspirin  81 mg Oral Daily  . ezetimibe  10 mg Oral Daily  . fluticasone  2 spray Each Nare QHS  . insulin aspart  0-15 Units Subcutaneous TID WC  . insulin aspart  0-5 Units Subcutaneous QHS  . insulin detemir  10 Units Subcutaneous QHS  . lactulose  20 g Oral Daily  . Mega Benfotiamine Antioxidant Capsule  1 capsule Oral Daily  . nateglinide  60 mg Oral TID WC  . pantoprazole  40 mg Oral Q1200  . pravastatin  80 mg Oral q1800  . silver sulfADIAZINE   Topical BID  . sodium chloride  3 mL Intravenous Q12H  . sodium chloride  3 mL Intravenous Q12H  . spironolactone  12.5 mg Oral Daily  . tolvaptan  30 mg Oral Q24H  . warfarin   Does not apply Once   Infusions:  . sodium chloride Stopped (06/01/14 2000)  .  amiodarone 30 mg/hr (06/10/14 0800)  . furosemide (LASIX) infusion 20 mg/hr (06/10/14 1100)  . heparin 1,350 Units/hr (06/10/14 1102)  . milrinone 0.5 mcg/kg/min (06/10/14 0800)    Assessment: 69yo male on coumadin pta for afib, admitted with CHF exacerbation and now undergoing LVAD workup. Coumadin placed on hold and he was started on a heparin bridge d/t procedures for LVAD workup.  Pt resumed on heparin gtt post RHC 06/21/2014.  Patient was previously therapeutic on heparin @ 1250 units/hr. RN noted one small nose bleed 3/11 PM-resolved, CBC low but stable, no other bleeding noted.  Heparin level now therapeutic x1 @ 1350 units/hr  Goal: Heparin level 0.3-0.7 units/ml Monitor platelets by anticoagulation protocol: Yes   Plan:  -Continue heparin gtt 1350 units/hr -Check 8h HL to confirm -Monitor daily HL, CBC, s/sx of bleeding  Waynette Butteryegan K. Amal Saiki, PharmD Clinical Pharmacy Resident Pager: [phone] 06/10/2014 11:11 AM

## 2014-06-10 NOTE — Progress Notes (Signed)
CARDIAC REHAB PHASE I   PRE:  Rate/Rhythm: 90 SR  BP:  Supine:   Sitting:   Standing: 125/69   SaO2:   MODE:  Ambulation: 1050 ft   POST:  Rate/Rhythm: 104 ST  BP:  Supine:   Sitting: 138/62  Standing:    SaO2: 98% RA  1019-1038 Pt tolerated ambulation very well, fairly independent, no c/o, VSS.   Artist Paislinty M Sheana Bir, MS, ACSM CCEP

## 2014-06-11 LAB — GLUCOSE, CAPILLARY
GLUCOSE-CAPILLARY: 238 mg/dL — AB (ref 70–99)
GLUCOSE-CAPILLARY: 154 mg/dL — AB (ref 70–99)
Glucose-Capillary: 155 mg/dL — ABNORMAL HIGH (ref 70–99)
Glucose-Capillary: 94 mg/dL (ref 70–99)

## 2014-06-11 LAB — BASIC METABOLIC PANEL
Anion gap: 11 (ref 5–15)
BUN: 67 mg/dL — ABNORMAL HIGH (ref 6–23)
CHLORIDE: 83 mmol/L — AB (ref 96–112)
CO2: 29 mmol/L (ref 19–32)
Calcium: 8.9 mg/dL (ref 8.4–10.5)
Creatinine, Ser: 3.02 mg/dL — ABNORMAL HIGH (ref 0.50–1.35)
GFR calc non Af Amer: 20 mL/min — ABNORMAL LOW (ref >90)
GFR, EST AFRICAN AMERICAN: 23 mL/min — AB (ref >90)
Glucose, Bld: 247 mg/dL — ABNORMAL HIGH (ref 70–99)
Potassium: 3.6 mmol/L (ref 3.5–5.1)
SODIUM: 123 mmol/L — AB (ref 135–145)

## 2014-06-11 LAB — HEPARIN LEVEL (UNFRACTIONATED): HEPARIN UNFRACTIONATED: 0.58 [IU]/mL (ref 0.30–0.70)

## 2014-06-11 LAB — PROTIME-INR
INR: 1.41 (ref 0.00–1.49)
PROTHROMBIN TIME: 17.4 s — AB (ref 11.6–15.2)

## 2014-06-11 MED ORDER — WARFARIN SODIUM 5 MG PO TABS
5.0000 mg | ORAL_TABLET | Freq: Once | ORAL | Status: AC
  Administered 2014-06-11: 5 mg via ORAL
  Filled 2014-06-11: qty 1

## 2014-06-11 MED ORDER — AMIODARONE HCL 200 MG PO TABS
400.0000 mg | ORAL_TABLET | Freq: Two times a day (BID) | ORAL | Status: DC
  Administered 2014-06-11 – 2014-06-16 (×12): 400 mg via ORAL
  Filled 2014-06-11 (×14): qty 2

## 2014-06-11 MED ORDER — WARFARIN - PHARMACIST DOSING INPATIENT
Freq: Every day | Status: DC
  Administered 2014-06-13: 7.5
  Administered 2014-06-14: 18:00:00

## 2014-06-11 NOTE — Progress Notes (Signed)
ANTICOAGULATION CONSULT NOTE - Follow Up Consult  Pharmacy Consult for heparin + warfarin Indication: atrial fibrillation  Allergies  Allergen Reactions  . Beta Adrenergic Blockers Other (See Comments)    Swollen hands, Per patient currently ([id number](Jul2015) tolerating Coreg.  . Fish Oil Diarrhea  . Januvia [Sitagliptin Phosphate] Other (See Comments)    Leg cramps  . Tetanus-Diphtheria Toxoids Td Swelling    Patient Measurements: Height: 5\' 11"  (180.3 cm) Weight: 156 lb (70.761 kg) IBW/kg (Calculated) : 75.3 Heparin Dosing Weight: 70.7 kg  Vital Signs: Temp: 97.2 F (36.2 C) (03/13 0730) Temp Source: Oral (03/13 0730) BP: 104/53 mmHg (03/13 0730) Pulse Rate: 81 (03/13 0730)  Labs:  Recent Labs  02-04-2015 0345 06/10/14 0135 06/10/14 0340 06/10/14 0945 06/11/14 0330  HGB 9.0*  --  9.0*  --   --   HCT 27.6*  --  26.6*  --   --   PLT 130*  --  117*  --   --   LABPROT 17.3* 16.3*  --   --  17.4*  INR 1.40 1.30  --   --  1.41  HEPARINUNFRC 0.36 0.17*  --  0.38 0.58  CREATININE 2.44*  --  2.61*  --  3.02*    Estimated Creatinine Clearance: 23.4 mL/min (by C-G formula based on Cr of 3.02).   Medications:  Scheduled:  . acetic acid-hydrocortisone  4 drop Right Ear QHS  . amiodarone  400 mg Oral BID  . aspirin  81 mg Oral Daily  . ezetimibe  10 mg Oral Daily  . fluticasone  2 spray Each Nare QHS  . insulin aspart  0-15 Units Subcutaneous TID WC  . insulin aspart  0-5 Units Subcutaneous QHS  . insulin detemir  10 Units Subcutaneous QHS  . lactulose  20 g Oral Daily  . Mega Benfotiamine Antioxidant Capsule  1 capsule Oral Daily  . nateglinide  60 mg Oral TID WC  . pantoprazole  40 mg Oral Q1200  . pravastatin  80 mg Oral q1800  . silver sulfADIAZINE   Topical BID  . sodium chloride  3 mL Intravenous Q12H  . sodium chloride  3 mL Intravenous Q12H  . warfarin   Does not apply Once   Infusions:  . sodium chloride Stopped (06/01/14 2000)  . heparin 1,350 Units/hr  (06/11/14 0131)  . milrinone 0.5 mcg/kg/min (06/11/14 [id number])    Assessment: 69yo male on coumadin pta for afib, admitted with CHF exacerbation and now undergoing LVAD workup. Coumadin placed on hold and he was started on a heparin bridge d/t procedures for LVAD workup.  Determined to be poor LVAD candidate d/t RV dysfunction and CKD > referral to transplant center for evaluation.  Now to resume warfarin, no NOAC d/t renal dysfunction.  Pt resumed on heparin gtt post RHC Sep 24, 2014. RN noted one small nose bleed 3/11 PM-resolved, CBC low but stable, no other bleeding noted.  Heparin level remains therapeutic @ 1350 units/hr INR 1.4, last dose of warfarin 06/04/2014 Note, interaction w/ amiodarone which will increase warfarin sensitivity  Goal: INR 2-3 Heparin level 0.3-0.7 units/ml Monitor platelets by anticoagulation protocol: Yes   Plan:  -Continue heparin gtt 1350 units/hr -Warfarin 5mg  x1 today -Discontinue heparin gtt when INR >2 -Monitor daily HL, INR, CBC, s/sx of bleeding  Waynette Butteryegan K. Kieran Nachtigal, PharmD Clinical Pharmacy Resident Pager: [phone] 06/11/2014 9:02 AM

## 2014-06-11 NOTE — Progress Notes (Signed)
Patient ID: Luke Pittman, male   DOB: April 25, 1945, 69 y.o.   MRN: [id number]   SUBJECTIVE:   RHC 3/11 with improved hemodynamics. Milrinone gtt now at 0.5. Lasix and tolvaptan stopped due to rising creatinine.  Creatinine up to 3 today with CVP 20.   ECHO EF 20% mild LVH  RV mod-severely dilated with moderate-severe systolic dysfunction .    RHC 3/10 RA mean 14 RV 53/15 PA 55/21, mean 34 PCWP mean 25 Oxygen saturations: PA 61% AO 95% Cardiac Output (Fick) 6.07  Cardiac Index (Fick) 3.19 PVR 1.5 WU  Scheduled Meds: . acetic acid-hydrocortisone  4 drop Right Ear QHS  . amiodarone  400 mg Oral BID  . aspirin  81 mg Oral Daily  . ezetimibe  10 mg Oral Daily  . fluticasone  2 spray Each Nare QHS  . insulin aspart  0-15 Units Subcutaneous TID WC  . insulin aspart  0-5 Units Subcutaneous QHS  . insulin detemir  10 Units Subcutaneous QHS  . lactulose  20 g Oral Daily  . Mega Benfotiamine Antioxidant Capsule  1 capsule Oral Daily  . nateglinide  60 mg Oral TID WC  . pantoprazole  40 mg Oral Q1200  . pravastatin  80 mg Oral q1800  . silver sulfADIAZINE   Topical BID  . sodium chloride  3 mL Intravenous Q12H  . sodium chloride  3 mL Intravenous Q12H  . warfarin   Does not apply Once   Continuous Infusions: . sodium chloride Stopped (06/01/14 2000)  . heparin 1,350 Units/hr (06/11/14 0131)  . milrinone 0.5 mcg/kg/min (06/11/14 0637)   PRN Meds:.sodium chloride, sodium chloride, acetaminophen, HYDROcodone-acetaminophen, MUSCLE RUB, ondansetron (ZOFRAN) IV, sodium chloride, sodium chloride, sodium chloride   Filed Vitals:   06/10/14 1900 06/10/14 2012 06/10/14 2309 06/11/14 0300  BP: 108/73  107/52 95/55  Pulse: 93  89   Temp: 97.4 F (36.3 C)  97.3 F (36.3 C) 97.9 F (36.6 C)  TempSrc: Oral  Oral Oral  Resp: 18 16 15 18   Height:      Weight:    156 lb (70.761 kg)  SpO2: 97%  96% 96%    Intake/Output Summary (Last 24 hours) at 06/11/14 0850 Last data filed at  06/11/14 0700  Gross per 24 hour  Intake 1520.31 ml  Output   1950 ml  Net -429.69 ml    LABS: Basic Metabolic Panel:  Recent Labs  95/28/4102/03/15 0340 06/11/14 0330  NA 122* 123*  K 3.9 3.6  CL 83* 83*  CO2 28 29  GLUCOSE 327* 247*  BUN 61* 67*  CREATININE 2.61* 3.02*  CALCIUM 8.8 8.9   Liver Function Tests:  Recent Labs  06/10/14 0340  AST 23  ALT 34  ALKPHOS 107  BILITOT 2.0*  PROT 6.6  ALBUMIN 3.4*   No results for input(s): LIPASE, AMYLASE in the last 72 hours. CBC:  Recent Labs  06/02/2014 0345 06/10/14 0340  WBC 6.0 5.3  HGB 9.0* 9.0*  HCT 27.6* 26.6*  MCV 72.3* 70.0*  PLT 130* 117*   Cardiac Enzymes: No results for input(s): CKTOTAL, CKMB, CKMBINDEX, TROPONINI in the last 72 hours. BNP: Invalid input(s): POCBNP D-Dimer: No results for input(s): DDIMER in the last 72 hours. Hemoglobin A1C: No results for input(s): HGBA1C in the last 72 hours. Fasting Lipid Panel: No results for input(s): CHOL, HDL, LDLCALC, TRIG, CHOLHDL, LDLDIRECT in the last 72 hours. Thyroid Function Tests: No results for input(s): TSH, T4TOTAL, T3FREE, THYROIDAB in the last  72 hours.  Invalid input(s): FREET3 Anemia Panel: No results for input(s): VITAMINB12, FOLATE, FERRITIN, TIBC, IRON, RETICCTPCT in the last 72 hours.  RADIOLOGY: Ct Abdomen Pelvis Wo Contrast  06/05/2014   CLINICAL DATA:  69 year old with cardiomyopathy. Preop evaluation prior to placement of left ventricular assist device.  EXAM: CT ABDOMEN AND PELVIS WITHOUT CONTRAST  TECHNIQUE: Multidetector CT imaging of the abdomen and pelvis was performed following the standard protocol without IV contrast.  COMPARISON:  Chest CT 06/04/2014  FINDINGS: Again noted is a small left pleural effusion. There is cardiomegaly without significant pericardial fluid. No evidence for free intraperitoneal air.  Unenhanced CT was performed per clinician order. Lack of IV contrast limits sensitivity and specificity, especially for  evaluation of abdominal/pelvic solid viscera.  There is subcutaneous edema. There is a moderate amount of ascites around the liver and within the pelvis. Small amount of ascites around the spleen. No gross abnormality to the liver, gallbladder or spleen. Spleen size is normal. Normal appearance of the pancreas, stomach and adrenal glands. Normal appearance of the kidneys, urinary bladder and prostate.  Atherosclerotic calcifications in the abdominal aorta without aneurysm. No significant lymphadenopathy involving the abdomen or pelvis. Normal appearance of the small bowel, large bowel and appendix. Mild disc space loss at L5-S1. Mild facet arthropathy in the lower lumbar spine.  IMPRESSION: Small-to-moderate amount of ascites. Subcutaneous edema and a small left pleural effusion.   Electronically Signed   By: Richarda Overlie M.D.   On: 06/05/2014 12:42   Dg Orthopantogram  06/05/2014   CLINICAL DATA:  Preoperative evaluation  EXAM: ORTHOPANTOGRAM/PANORAMIC  COMPARISON:  None  FINDINGS: Dental appliances present.  Multiple prior dental RIGHT extractions with dental amalgam at multiple remaining teeth.  No mandibular fracture or bone destruction identified.  IMPRESSION: No acute mandibular abnormalities.   Electronically Signed   By: Ulyses Southward M.D.   On: 06/05/2014 12:10   Dg Chest 2 View  05/11/2014   CLINICAL DATA:  Congestive failure  EXAM: CHEST  2 VIEW  COMPARISON:  10/03/2013  FINDINGS: Cardiac shadow remains enlarged. Postsurgical changes are again seen. No vascular congestion is seen. Very minimal effusions are noted bilaterally. No focal infiltrate is noted. No bony abnormality is seen.  IMPRESSION: Small effusions.  No other significant abnormality is noted.   Electronically Signed   By: Alcide Clever M.D.   On: 05/25/2014 13:44   Ct Chest Wo Contrast  06/04/2014   CLINICAL DATA:  Shortness of breath, history of heart failure  EXAM: CT CHEST WITHOUT CONTRAST  TECHNIQUE: Multidetector CT imaging of the  chest was performed following the standard protocol without IV contrast.  COMPARISON:  05/31/2014  FINDINGS: Mild peribronchial wall thickening. Mild interstitial change with a few Kerley B-lines bilaterally. No alveolar opacification. Small bilateral pleural effusions larger on the left. No significant pericardial effusion.  Severe cardiac enlargement and coronary artery calcification. Mild thoracic aortic calcification.  Right PICC line tip extends to the cavoatrial junction.  Images through the upper abdomen show a small volume of ascites.  No acute musculoskeletal findings. No significant hilar or mediastinal adenopathy.  IMPRESSION: Findings consistent with congestive heart failure with mild interstitial pulmonary edema.   Electronically Signed   By: Esperanza Heir M.D.   On: 06/04/2014 16:08   US Abdomen Complete  05/18/2014   CLINICAL DATA:  Increased liver function test. History of diabetes, chronic kidney disease and patient is taking Cymbalta  EXAM: ULTRASOUND ABDOMEN COMPLETE  COMPARISON:  None.  FINDINGS:  Gallbladder: Cholelithiasis without pericholecystic fluid. Gallbladder wall wall thickening measuring 5.6 mm. Negative sonographic Murphy sign.  Common bile duct: Diameter: 2.5 mm  Liver: No focal lesion identified. Within normal limits in parenchymal echogenicity.  IVC: No abnormality visualized.  Pancreas: Limited visualization secondary to overlying bowel gas.  Spleen: Size and appearance within normal limits.  Right Kidney: Length: 11.5 cm. Echogenicity within normal limits. No mass or hydronephrosis visualized.  Left Kidney: Length: 11.6 cm. Echogenicity within normal limits. No mass or hydronephrosis visualized.  Abdominal aorta: No aneurysm visualized.  Other findings: There is a small amount of ascites. There are bilateral pleural effusions.  IMPRESSION: 1. Cholelithiasis. Gallbladder wall thickening which may be secondary to intrinsic gallbladder wall abnormality suggest cholecystitis, but  can also be seen in the setting of ascites and hepatocellular disease versus hypoalbuminemia versus fluid overload. The lateral etiologies are favored over cholecystitis. 2. Small amount of ascites. 3. Bilateral pleural effusions.   Electronically Signed   By: Elige Ko   On: 05/21/2014 14:56   Dg Chest Port 1 View  05/31/2014   CLINICAL DATA:  PICC line placement  EXAM: PORTABLE CHEST - 1 VIEW  COMPARISON:  05/06/2014  FINDINGS: Right-sided PICC line with the tip projecting over the cavoatrial junction. No focal consolidation or pneumothorax. Trace left pleural effusion. There is stable cardiomegaly. There is evidence of prior CABG.  Mild osteoarthritis of bilateral glenohumeral joints.  IMPRESSION: Right-sided PICC line with the tip projecting over the cavoatrial junction.   Electronically Signed   By: Elige Ko   On: 05/31/2014 16:43    PHYSICAL EXAM CVP 20 General: NAD Sitting on the side of the bed.  Neck: JVP to jaw no thyromegaly or thyroid nodule.  Lungs: Decreased breath sounds at bases.  CV: Nondisplaced PMI.  Heart mildly tachy, regular S1/S2, no S3/S4, no murmur.  Tr-1+ ankle edema.  Abdomen: Soft, nontender, no hepatosplenomegaly, mildly distended  Psych: Normal affect. Extremities: No clubbing or cyanosis.    TELEMETRY: Reviewed telemetry pt in atrial flutter in 80s.   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  1. Acute on chronic systolic CHF: EF 81% with at least moderate RV dysfunction by echo. He does not have an ICD (has not wanted in past).  He presented with NYHA class IIIb symptoms (dyspnea and profound fatigue), poor appetite, and markedly elevated transaminases. He remains volume overloaded on exam. BP is stable. RHC showed elevated filling pressures and low output.  On initial RHC, RA/PCWP = 0.75.  Milrinone gtt was begun 3/2 and he felt much better/more alert.  Milrinone turned down to 0.125 on 3/3  with fall in co-ox.  Milrinone back up to 0.25, co-ox 61% with CVP 22.  3/6 milrinone was increased to 0.375 mcg then up to 0.5 on 3/10.  RHC repeated on 3/10, RA/PWCP better at 0.56 and good CI but filling pressures still high.  Now, creatinine up to 3. - Continue milrinone at 0.5.   - No beta blocker other than amiodarone given low output.   - Off lisinopril with hyperkalemia/elevated creatinine.  Also stopped digoxin for now.   - Diuretics stopped due to rising creatinine 3/12, leave off today.  - He has seen LVAD coordinator.  He will be a difficult candidate for LVAD due to RV dysfunction and CKD.  At this point, think that we will be sending him home on milrinone gtt.  Will send to transplant center for  evaluation.  Will place Life[facility] for now at discharge.  2. AKI: Cardiorenal syndrome.  Creatinine worse today. Lasix stopped 3. CAD: S/p CABG. Cath in 2013 with patent grafts. No chest pain. Continue ASA, LFTs back down so restarted statin.   4. Elevated transaminases: Suspect hepatic congestion from CHF.Abdominal US showed normal liver echotexture. 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   5. Diabetes: added nateglinide per diabetes coordinator.  Not good candidate for metformin in the future with CKD/advancedHF.  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  7. Atrial fibrillation: Went into atrial fibrillation => flutter this admission on milrinone.  He is on heparin gtt and amiodarone gtt, HR 80s.   - Will start warfarin given elevated creatinine (rather than NOAC).  - Transition to po amiodarone today.  - Will consider DCCV (can do TEE-guided DCCV) after full amiodarone load, would give it about 2 more weeks.  8. Hyponatremia: Fluid restrict, 1500 cc. Na a bit better.     Marca Ancona  MD 06/11/2014 8:50 AM

## 2014-06-12 DIAGNOSIS — I481 Persistent atrial fibrillation: Secondary | ICD-10-CM

## 2014-06-12 LAB — COMPREHENSIVE METABOLIC PANEL
ALT: 26 U/L (ref 0–53)
AST: 22 U/L (ref 0–37)
Albumin: 3.2 g/dL — ABNORMAL LOW (ref 3.5–5.2)
Alkaline Phosphatase: 95 U/L (ref 39–117)
Anion gap: 14 (ref 5–15)
BUN: 72 mg/dL — AB (ref 6–23)
CO2: 26 mmol/L (ref 19–32)
Calcium: 9 mg/dL (ref 8.4–10.5)
Chloride: 81 mmol/L — ABNORMAL LOW (ref 96–112)
Creatinine, Ser: 3.5 mg/dL — ABNORMAL HIGH (ref 0.50–1.35)
GFR, EST AFRICAN AMERICAN: 19 mL/min — AB (ref >90)
GFR, EST NON AFRICAN AMERICAN: 17 mL/min — AB (ref >90)
Glucose, Bld: 137 mg/dL — ABNORMAL HIGH (ref 70–99)
POTASSIUM: 3.8 mmol/L (ref 3.5–5.1)
Sodium: 121 mmol/L — ABNORMAL LOW (ref 135–145)
Total Bilirubin: 1.8 mg/dL — ABNORMAL HIGH (ref 0.3–1.2)
Total Protein: 6.2 g/dL (ref 6.0–8.3)

## 2014-06-12 LAB — GLUCOSE, CAPILLARY
GLUCOSE-CAPILLARY: 122 mg/dL — AB (ref 70–99)
GLUCOSE-CAPILLARY: 164 mg/dL — AB (ref 70–99)
GLUCOSE-CAPILLARY: 182 mg/dL — AB (ref 70–99)
Glucose-Capillary: 174 mg/dL — ABNORMAL HIGH (ref 70–99)

## 2014-06-12 LAB — CBC
HCT: 25.5 % — ABNORMAL LOW (ref 39.0–52.0)
Hemoglobin: 8.6 g/dL — ABNORMAL LOW (ref 13.0–17.0)
MCH: 23.8 pg — AB (ref 26.0–34.0)
MCHC: 33.7 g/dL (ref 30.0–36.0)
MCV: 70.4 fL — ABNORMAL LOW (ref 78.0–100.0)
PLATELETS: 130 10*3/uL — AB (ref 150–400)
RBC: 3.62 MIL/uL — ABNORMAL LOW (ref 4.22–5.81)
RDW: 18.1 % — ABNORMAL HIGH (ref 11.5–15.5)
WBC: 7.1 10*3/uL (ref 4.0–10.5)

## 2014-06-12 LAB — PROTIME-INR
INR: 1.36 (ref 0.00–1.49)
PROTHROMBIN TIME: 16.9 s — AB (ref 11.6–15.2)

## 2014-06-12 LAB — CARBOXYHEMOGLOBIN
Carboxyhemoglobin: 1.6 % — ABNORMAL HIGH (ref 0.5–1.5)
Methemoglobin: 1.3 % (ref 0.0–1.5)
O2 Saturation: 62.6 %
TOTAL HEMOGLOBIN: 8.8 g/dL — AB (ref 13.5–18.0)

## 2014-06-12 LAB — HEPARIN LEVEL (UNFRACTIONATED): HEPARIN UNFRACTIONATED: 0.58 [IU]/mL (ref 0.30–0.70)

## 2014-06-12 MED ORDER — WARFARIN SODIUM 7.5 MG PO TABS
7.5000 mg | ORAL_TABLET | Freq: Once | ORAL | Status: AC
  Administered 2014-06-12: 7.5 mg via ORAL
  Filled 2014-06-12: qty 1

## 2014-06-12 NOTE — Progress Notes (Signed)
AM nurse passed in report that pt had only voided 200mL on 06/12/14. Cardiology Fellow notified. No further orders placed. Will continue to monitor pt.

## 2014-06-12 NOTE — Progress Notes (Signed)
NUTRITION FOLLOW UP  Intervention:   -Continue with current nutrition plan of care  Nutrition Dx:   Unintentional weight loss related to CHF, fluid overload as evidenced by 4.5% wt loss x 1 week; ongoing  Goal:   Pt will meet >90% of estimated nutritional needs; met  Monitor:   PO intake, labs, weight changes, I/O's  Assessment:   S/p Procedure on 06/15/2014:  Right Heart Cath  Pt sleeping soundly at time of visit.  His appetite remains good; he continues to consume 50-100% of meals. Noted a 11# (6.5%) wt loss x 1 week, due to diuresis, which were stopped on 06/10/14 due to rising creatinine levels. Per MD notes, discharge disposition is to return home with milrinone. Pt is a difficult LVAD candidate due to RV dysfunction and elevated creatinine.Noted pt will be sent to transplant center for further evaluation.   Labs reviewed. Na: 121, Cl: 81, BUN/Creat: 72/3.50, Glucose: 137, CBGS: 94-154.   Height: Ht Readings from Last 1 Encounters:  05/08/2014 5' 11" (1.803 m)    Weight Status:   Wt Readings from Last 1 Encounters:  06/12/14 157 lb 3 oz (71.3 kg)   06/05/14 168 lb 10.4 oz (76.5 kg)       Re-estimated needs:  Kcal: 1800-2000 Protein: 80-90 grams Fluid: 1.8-2.0 L  Skin: lt foot callous, ecchymosis  Diet Order: Diet Carb Modified, Heart Healthy   Intake/Output Summary (Last 24 hours) at 06/12/14 0949 Last data filed at 06/12/14 0800  Gross per 24 hour  Intake 1824.25 ml  Output   1000 ml  Net 824.25 ml    Last BM: 06/10/14   Labs:   Recent Labs Lab 06/10/14 0340 06/11/14 0330 06/12/14 0416  NA 122* 123* 121*  K 3.9 3.6 3.8  CL 83* 83* 81*  CO2 28 29 26  BUN 61* 67* 72*  CREATININE 2.61* 3.02* 3.50*  CALCIUM 8.8 8.9 9.0  GLUCOSE 327* 247* 137*    CBG (last 3)   Recent Labs  06/11/14 1707 06/11/14 2144 06/12/14 0720  GLUCAP 94 154* 122*    Scheduled Meds: . acetic acid-hydrocortisone  4 drop Right Ear QHS  . amiodarone  400 mg Oral  BID  . aspirin  81 mg Oral Daily  . ezetimibe  10 mg Oral Daily  . fluticasone  2 spray Each Nare QHS  . insulin aspart  0-15 Units Subcutaneous TID WC  . insulin aspart  0-5 Units Subcutaneous QHS  . insulin detemir  10 Units Subcutaneous QHS  . lactulose  20 g Oral Daily  . Mega Benfotiamine Antioxidant Capsule  1 capsule Oral Daily  . nateglinide  60 mg Oral TID WC  . pantoprazole  40 mg Oral Q1200  . pravastatin  80 mg Oral q1800  . silver sulfADIAZINE   Topical BID  . sodium chloride  3 mL Intravenous Q12H  . warfarin  7.5 mg Oral ONCE-1800  . Warfarin - Pharmacist Dosing Inpatient   Does not apply q1800    Continuous Infusions: . heparin 1,350 Units/hr (06/12/14 0800)  . milrinone 0.5 mcg/kg/min (06/12/14 0800)    Jenifer A. Williams, RD, LDN, CDE Pager: [phone] After hours Pager: [phone]    

## 2014-06-12 NOTE — Progress Notes (Signed)
CARDIAC REHAB PHASE I   PRE:  Rate/Rhythm: 86   BP:  Supine:   Sitting: 97/47  Standing:    SaO2: 99 RA  MODE:  Ambulation:  ft   POST:  Rate/Rhythm:   BP:  Supine:   Sitting:   Standing:    SaO2:  1115-1135 On arrival pt in recliner without c/o. Got him ready to ambulate, as soon as pt stood he became nauseated and heaving. Pt vomited some liquid and undigested food in trash can. Pt's nurse aware and in room to give pt IV Zofran. Pt in recliner resting quietly. Will follow pt later.  Melina CopaLisa Albirta Rhinehart RN 06/12/2014 11:36 AM

## 2014-06-12 NOTE — Progress Notes (Signed)
1445 Cardiac Rehab Pt in bed sleeping. We will follow pt tomorrow. Beatrix FettersHughes, Darianny Momon G, RN 06/12/2014 2:43 PM

## 2014-06-12 NOTE — Progress Notes (Signed)
VAD Team Note: Followed up with Mr. Luke Pittman and his wife today regarding ongoing plan for LVAD vs. Heart Transplant. They verbalized that they would be going home on milrinone with goal for transplant evaluation.   Will continue to follow up in the Advanced HF clinic as needed to facilitate transplant evaluation. Packet being prepared and will d/w medical team as to the location of his evaluation.   Rexene AlbertsStephanie Ashritha Desrosiers, RN VAD Coordinator   Office: [phone] 24/7 VAD Pager: [phone]

## 2014-06-12 NOTE — Progress Notes (Signed)
ANTICOAGULATION CONSULT NOTE - Follow Up Consult  Pharmacy Consult for heparin + warfarin Indication: atrial fibrillation  Allergies  Allergen Reactions  . Beta Adrenergic Blockers Other (See Comments)    Swollen hands, Per patient currently ([id number]) tolerating Coreg.  . Fish Oil Diarrhea  . Januvia [Sitagliptin Phosphate] Other (See Comments)    Leg cramps  . Tetanus-Diphtheria Toxoids Td Swelling    Patient Measurements: Height: 5\' 11"  (180.3 cm) Weight: 157 lb 3 oz (71.3 kg) IBW/kg (Calculated) : 75.3 Heparin Dosing Weight: 70.7 kg  Vital Signs: Temp: 97.6 F (36.4 C) (03/14 0718) Temp Source: Axillary (03/14 0718) BP: 126/60 mmHg (03/14 0718)  Labs:  Recent Labs  06/10/14 0135 06/10/14 0340 06/10/14 0945 06/11/14 0330 06/12/14 0415 06/12/14 0416  HGB  --  9.0*  --   --   --   --   HCT  --  26.6*  --   --   --   --   PLT  --  117*  --   --   --   --   LABPROT 16.3*  --   --  17.4* 16.9*  --   INR 1.30  --   --  1.41 1.36  --   HEPARINUNFRC 0.17*  --  0.38 0.58 0.58  --   CREATININE  --  2.61*  --  3.02*  --  3.50*    Estimated Creatinine Clearance: 20.4 mL/min (by C-G formula based on Cr of 3.5).   Medications:  Scheduled:  . acetic acid-hydrocortisone  4 drop Right Ear QHS  . amiodarone  400 mg Oral BID  . aspirin  81 mg Oral Daily  . ezetimibe  10 mg Oral Daily  . fluticasone  2 spray Each Nare QHS  . insulin aspart  0-15 Units Subcutaneous TID WC  . insulin aspart  0-5 Units Subcutaneous QHS  . insulin detemir  10 Units Subcutaneous QHS  . lactulose  20 g Oral Daily  . Mega Benfotiamine Antioxidant Capsule  1 capsule Oral Daily  . nateglinide  60 mg Oral TID WC  . pantoprazole  40 mg Oral Q1200  . pravastatin  80 mg Oral q1800  . silver sulfADIAZINE   Topical BID  . sodium chloride  3 mL Intravenous Q12H  . sodium chloride  3 mL Intravenous Q12H  . warfarin  7.5 mg Oral ONCE-1800  . warfarin   Does not apply Once  . Warfarin - Pharmacist  Dosing Inpatient   Does not apply q1800   Infusions:  . sodium chloride Stopped (06/01/14 2000)  . heparin 1,350 Units/hr (06/11/14 2220)  . milrinone 0.5 mcg/kg/min (06/12/14 0532)    Assessment: 69yo male admitted with CHF exacerbation and was undergoing LVAD workup but poor RV function and Cr rising. Plan is  referral to transplant center for evaluation.  Heparin drip started as inpatient for new Afib - Heparin drip 1350 uts/hr HL at goal 0.58.   Warfarin initiation was delayed d/t LVAD w/u but now not a candidate.  Warfarin started 3/13 INR 1.36. CBC stable, no bleeding  No NOAC d/t renal dysfunction. Amio - new for Afib - monitor for warfarin sensitivity.  Goal: INR 2-3 Heparin level 0.3-0.7 units/ml Monitor platelets by anticoagulation protocol: Yes   Plan:  -Continue heparin gtt 1350 units/hr -Warfarin 7.5mg  x1 today -Discontinue heparin gtt when INR >2 -Monitor daily HL, INR, CBC, s/sx of bleeding  Leota SauersLisa Marie Chow Pharm.D. CPP, BCPS Clinical Pharmacist [phone] 06/12/2014 8:33 AM

## 2014-06-12 NOTE — Progress Notes (Signed)
Patient ID: Luke Pittman, male   DOB: 03-25-1946, 69 y.o.   MRN: [id number]   SUBJECTIVE:   RHC 3/11 with improved hemodynamics. Milrinone gtt now at 0.5. Lasix and tolvaptan stopped 3/12 due to rising creatinine.  Creatinine up to 3.5 today with CVP 16.  Co-ox 63%.  Weight down 16 lbs from admission.   ECHO EF 20% mild LVH  RV mod-severely dilated with moderate-severe systolic dysfunction .    RHC 3/10 RA mean 14 RV 53/15 PA 55/21, mean 34 PCWP mean 25 Oxygen saturations: PA 61% AO 95% Cardiac Output (Fick) 6.07  Cardiac Index (Fick) 3.19 PVR 1.5 WU  Scheduled Meds: . acetic acid-hydrocortisone  4 drop Right Ear QHS  . amiodarone  400 mg Oral BID  . aspirin  81 mg Oral Daily  . ezetimibe  10 mg Oral Daily  . fluticasone  2 spray Each Nare QHS  . insulin aspart  0-15 Units Subcutaneous TID WC  . insulin aspart  0-5 Units Subcutaneous QHS  . insulin detemir  10 Units Subcutaneous QHS  . lactulose  20 g Oral Daily  . Mega Benfotiamine Antioxidant Capsule  1 capsule Oral Daily  . nateglinide  60 mg Oral TID WC  . pantoprazole  40 mg Oral Q1200  . pravastatin  80 mg Oral q1800  . silver sulfADIAZINE   Topical BID  . sodium chloride  3 mL Intravenous Q12H  . sodium chloride  3 mL Intravenous Q12H  . warfarin   Does not apply Once  . Warfarin - Pharmacist Dosing Inpatient   Does not apply q1800   Continuous Infusions: . sodium chloride Stopped (06/01/14 2000)  . heparin 1,350 Units/hr (06/11/14 2220)  . milrinone 0.5 mcg/kg/min (06/12/14 0532)   PRN Meds:.sodium chloride, sodium chloride, acetaminophen, HYDROcodone-acetaminophen, MUSCLE RUB, ondansetron (ZOFRAN) IV, sodium chloride, sodium chloride, sodium chloride   Filed Vitals:   06/11/14 2337 06/11/14 2348 06/12/14 0405 06/12/14 0718  BP: 109/43  122/70 126/60  Pulse:      Temp:  97.5 F (36.4 C) 97.6 F (36.4 C) 97.6 F (36.4 C)  TempSrc:  Oral Oral Axillary  Resp: 18  18 18   Height:      Weight:   157 lb  3 oz (71.3 kg)   SpO2:   98% 97%    Intake/Output Summary (Last 24 hours) at 06/12/14 0821 Last data filed at 06/12/14 0600  Gross per 24 hour  Intake 1678.7 ml  Output   1000 ml  Net  678.7 ml    LABS: Basic Metabolic Panel:  Recent Labs  40/98/1103/13/16 0330 06/12/14 0416  NA 123* 121*  K 3.6 3.8  CL 83* 81*  CO2 29 26  GLUCOSE 247* 137*  BUN 67* 72*  CREATININE 3.02* 3.50*  CALCIUM 8.9 9.0   Liver Function Tests:  Recent Labs  06/10/14 0340 06/12/14 0416  AST 23 22  ALT 34 26  ALKPHOS 107 95  BILITOT 2.0* 1.8*  PROT 6.6 6.2  ALBUMIN 3.4* 3.2*   No results for input(s): LIPASE, AMYLASE in the last 72 hours. CBC:  Recent Labs  06/10/14 0340  WBC 5.3  HGB 9.0*  HCT 26.6*  MCV 70.0*  PLT 117*   Cardiac Enzymes: No results for input(s): CKTOTAL, CKMB, CKMBINDEX, TROPONINI in the last 72 hours. BNP: Invalid input(s): POCBNP D-Dimer: No results for input(s): DDIMER in the last 72 hours. Hemoglobin A1C: No results for input(s): HGBA1C in the last 72 hours. Fasting Lipid Panel: No  results for input(s): CHOL, HDL, LDLCALC, TRIG, CHOLHDL, LDLDIRECT in the last 72 hours. Thyroid Function Tests: No results for input(s): TSH, T4TOTAL, T3FREE, THYROIDAB in the last 72 hours.  Invalid input(s): FREET3 Anemia Panel: No results for input(s): VITAMINB12, FOLATE, FERRITIN, TIBC, IRON, RETICCTPCT in the last 72 hours.  RADIOLOGY: Ct Abdomen Pelvis Wo Contrast  06/05/2014   CLINICAL DATA:  69 year old with cardiomyopathy. Preop evaluation prior to placement of left ventricular assist device.  EXAM: CT ABDOMEN AND PELVIS WITHOUT CONTRAST  TECHNIQUE: Multidetector CT imaging of the abdomen and pelvis was performed following the standard protocol without IV contrast.  COMPARISON:  Chest CT 06/04/2014  FINDINGS: Again noted is a small left pleural effusion. There is cardiomegaly without significant pericardial fluid. No evidence for free intraperitoneal air.  Unenhanced CT  was performed per clinician order. Lack of IV contrast limits sensitivity and specificity, especially for evaluation of abdominal/pelvic solid viscera.  There is subcutaneous edema. There is a moderate amount of ascites around the liver and within the pelvis. Small amount of ascites around the spleen. No gross abnormality to the liver, gallbladder or spleen. Spleen size is normal. Normal appearance of the pancreas, stomach and adrenal glands. Normal appearance of the kidneys, urinary bladder and prostate.  Atherosclerotic calcifications in the abdominal aorta without aneurysm. No significant lymphadenopathy involving the abdomen or pelvis. Normal appearance of the small bowel, large bowel and appendix. Mild disc space loss at L5-S1. Mild facet arthropathy in the lower lumbar spine.  IMPRESSION: Small-to-moderate amount of ascites. Subcutaneous edema and a small left pleural effusion.   Electronically Signed   By: Richarda Overlie M.D.   On: 06/05/2014 12:42   Dg Orthopantogram  06/05/2014   CLINICAL DATA:  Preoperative evaluation  EXAM: ORTHOPANTOGRAM/PANORAMIC  COMPARISON:  None  FINDINGS: Dental appliances present.  Multiple prior dental RIGHT extractions with dental amalgam at multiple remaining teeth.  No mandibular fracture or bone destruction identified.  IMPRESSION: No acute mandibular abnormalities.   Electronically Signed   By: Ulyses Southward M.D.   On: 06/05/2014 12:10   Dg Chest 2 View  05/14/2014   CLINICAL DATA:  Congestive failure  EXAM: CHEST  2 VIEW  COMPARISON:  10/03/2013  FINDINGS: Cardiac shadow remains enlarged. Postsurgical changes are again seen. No vascular congestion is seen. Very minimal effusions are noted bilaterally. No focal infiltrate is noted. No bony abnormality is seen.  IMPRESSION: Small effusions.  No other significant abnormality is noted.   Electronically Signed   By: Alcide Clever M.D.   On: 05/07/2014 13:44   Ct Chest Wo Contrast  06/04/2014   CLINICAL DATA:  Shortness of breath,  history of heart failure  EXAM: CT CHEST WITHOUT CONTRAST  TECHNIQUE: Multidetector CT imaging of the chest was performed following the standard protocol without IV contrast.  COMPARISON:  05/31/2014  FINDINGS: Mild peribronchial wall thickening. Mild interstitial change with a few Kerley B-lines bilaterally. No alveolar opacification. Small bilateral pleural effusions larger on the left. No significant pericardial effusion.  Severe cardiac enlargement and coronary artery calcification. Mild thoracic aortic calcification.  Right PICC line tip extends to the cavoatrial junction.  Images through the upper abdomen show a small volume of ascites.  No acute musculoskeletal findings. No significant hilar or mediastinal adenopathy.  IMPRESSION: Findings consistent with congestive heart failure with mild interstitial pulmonary edema.   Electronically Signed   By: Esperanza Heir M.D.   On: 06/04/2014 16:08   US Abdomen Complete  05/28/2014   CLINICAL  DATA:  Increased liver function test. History of diabetes, chronic kidney disease and patient is taking Cymbalta  EXAM: ULTRASOUND ABDOMEN COMPLETE  COMPARISON:  None.  FINDINGS: Gallbladder: Cholelithiasis without pericholecystic fluid. Gallbladder wall wall thickening measuring 5.6 mm. Negative sonographic Murphy sign.  Common bile duct: Diameter: 2.5 mm  Liver: No focal lesion identified. Within normal limits in parenchymal echogenicity.  IVC: No abnormality visualized.  Pancreas: Limited visualization secondary to overlying bowel gas.  Spleen: Size and appearance within normal limits.  Right Kidney: Length: 11.5 cm. Echogenicity within normal limits. No mass or hydronephrosis visualized.  Left Kidney: Length: 11.6 cm. Echogenicity within normal limits. No mass or hydronephrosis visualized.  Abdominal aorta: No aneurysm visualized.  Other findings: There is a small amount of ascites. There are bilateral pleural effusions.  IMPRESSION: 1. Cholelithiasis. Gallbladder wall  thickening which may be secondary to intrinsic gallbladder wall abnormality suggest cholecystitis, but can also be seen in the setting of ascites and hepatocellular disease versus hypoalbuminemia versus fluid overload. The lateral etiologies are favored over cholecystitis. 2. Small amount of ascites. 3. Bilateral pleural effusions.   Electronically Signed   By: Elige Ko   On: 06/25/14 14:56   Dg Chest Port 1 View  05/31/2014   CLINICAL DATA:  PICC line placement  EXAM: PORTABLE CHEST - 1 VIEW  COMPARISON:  June 25, 2014  FINDINGS: Right-sided PICC line with the tip projecting over the cavoatrial junction. No focal consolidation or pneumothorax. Trace left pleural effusion. There is stable cardiomegaly. There is evidence of prior CABG.  Mild osteoarthritis of bilateral glenohumeral joints.  IMPRESSION: Right-sided PICC line with the tip projecting over the cavoatrial junction.   Electronically Signed   By: Elige Ko   On: 05/31/2014 16:43    PHYSICAL EXAM CVP 16 General: NAD Sitting on the side of the bed.  Neck: JVP 14-16, no thyromegaly or thyroid nodule.  Lungs: Decreased breath sounds at bases.  CV: Nondisplaced PMI.  Heart mildly tachy, regular S1/S2, no S3/S4, no murmur.  1+ ankle edema.  Abdomen: Soft, nontender, no hepatosplenomegaly, mildly distended  Psych: Normal affect. Extremities: No clubbing or cyanosis.    TELEMETRY: Reviewed telemetry pt in atrial flutter in 90s.   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  1. Acute on chronic systolic CHF: EF 40% with at least moderate RV dysfunction by echo. He does not have an ICD (has not wanted in past).  He presented with NYHA class IIIb symptoms (dyspnea and profound fatigue), poor appetite, and markedly elevated transaminases. He remains volume overloaded on exam. BP is stable. RHC showed elevated filling pressures and low output.  On initial RHC, RA/PCWP = 0.75.   Milrinone gtt was begun 3/2 and he felt much better/more alert.  Milrinone turned down to 0.125 on 3/3 with fall in co-ox.  Milrinone back up to 0.25, co-ox 61% with CVP 22.  3/6 milrinone was increased to 0.375 mcg then up to 0.5 on 3/10.  RHC repeated on 3/10, RA/PWCP better at 0.56 and good CI but filling pressures still high.  Now, creatinine up to 3.5.  Co-ox 63% today. - Continue milrinone at 0.5.   - No beta blocker other than amiodarone given low output.   - Off lisinopril with hyperkalemia/elevated creatinine.  Also stopped digoxin for now.   - Diuretics stopped due to rising creatinine 3/12, leave off today.  - He has seen LVAD coordinator.  He will be a difficult  candidate for LVAD due to RV dysfunction and elevated creatinine.  At this point, think that we will be sending him home on milrinone gtt.  Will send to transplant center for evaluation.  Will place Lifevest for now at discharge.  2. AKI: Cardiorenal syndrome.  Creatinine worse today. He is off diuretics and we are watching. 3. CAD: S/p CABG. Cath in 2013 with patent grafts. No chest pain. Continue ASA, LFTs back to normal so restarted statin.   4. Elevated transaminases: Suspect hepatic congestion from CHF.Abdominal US showed normal liver echotexture. 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  His LFTs have come back to normal.  5. Diabetes: added nateglinide per diabetes coordinator.  Not good candidate for metformin in the future with CKD/advancedHF.  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  7. Atrial fibrillation: Went into atrial fibrillation => flutter this admission on milrinone.  He is on heparin gtt and amiodarone, HR 90s.   - Started warfarin given elevated creatinine (rather than NOAC).  - Now on po amiodarone.   - Will consider DCCV (can do TEE-guided DCCV) after full amiodarone load, would give it about 2 more weeks.  8. Hyponatremia: Fluid restrict, 1500 cc. Off tolvaptan with rising creatinine.      Marca Ancona  MD 06/12/2014 8:21 AM

## 2014-06-12 NOTE — Progress Notes (Signed)
Progress Note from the Palliative Medicine Team at [facility]: I met today with Luke Pittman and wife at bedside. We went through Advance Directive and I explained and we marked his choices and he understands not to initial/sign until notary present. All questions answered. He is very realistic but his wife is worried she will not be able to make decisions like he would want. He is considering his HCPOA currently and then will be ready to finalize Advance Directives. He is planned home with milrinone and Lifevest and they will be evaluated at transplant center. He has complained of nausea today but this has improved with antiemetic.     Objective: Allergies  Allergen Reactions  . Beta Adrenergic Blockers Other (See Comments)    Swollen hands, Per patient currently ([id number]) tolerating Coreg.  . Fish Oil Diarrhea  . Januvia [Sitagliptin Phosphate] Other (See Comments)    Leg cramps  . Tetanus-Diphtheria Toxoids Td Swelling   Scheduled Meds: . acetic acid-hydrocortisone  4 drop Right Ear QHS  . amiodarone  400 mg Oral BID  . aspirin  81 mg Oral Daily  . ezetimibe  10 mg Oral Daily  . fluticasone  2 spray Each Nare QHS  . insulin aspart  0-15 Units Subcutaneous TID WC  . insulin aspart  0-5 Units Subcutaneous QHS  . insulin detemir  10 Units Subcutaneous QHS  . lactulose  20 g Oral Daily  . Mega Benfotiamine Antioxidant Capsule  1 capsule Oral Daily  . nateglinide  60 mg Oral TID WC  . pantoprazole  40 mg Oral Q1200  . pravastatin  80 mg Oral q1800  . silver sulfADIAZINE   Topical BID  . sodium chloride  3 mL Intravenous Q12H  . warfarin  7.5 mg Oral ONCE-1800  . Warfarin - Pharmacist Dosing Inpatient   Does not apply q1800   Continuous Infusions: . heparin 1,350 Units/hr (06/12/14 1000)  . milrinone 0.5 mcg/kg/min (06/12/14 1000)   PRN Meds:.sodium chloride, acetaminophen, HYDROcodone-acetaminophen, MUSCLE RUB, ondansetron (ZOFRAN) IV, sodium chloride, sodium  chloride  BP 126/60 mmHg  Pulse 96  Temp(Src) 97.6 F (36.4 C) (Axillary)  Resp 18  Ht $R'5\' 11"'pU$  (1.803 m)  Wt 71.3 kg (157 lb 3 oz)  BMI 21.93 kg/m2  SpO2 97%   PPS: 40%   Intake/Output Summary (Last 24 hours) at 06/12/14 1404 Last data filed at 06/12/14 1000  Gross per 24 hour  Intake   1466 ml  Output   1000 ml  Net    466 ml       Physical Exam:  General: NAD, lying in bed HEENT:  [place]/AT, + JVD, moist mucous membranes Chest: No labored breathing, symmetric, room air CVS: Tachy Abdomen: Soft, NT, ND Ext: MAE, no edema, warm to touch Neuro: Awake, alert, oriented x 3  Labs: CBC    Component Value Date/Time   WBC 7.1 06/12/2014 0416   WBC 7.4 04/27/2014 0944   WBC 6.9 12/13/2013 0941   RBC 3.62* 06/12/2014 0416   RBC 3.93* 06/05/2014 0815   RBC 4.10* 04/27/2014 0944   RBC 4.5* 12/13/2013 0941   HGB 8.6* 06/12/2014 0416   HGB 11.1* 12/13/2013 0941   HCT 25.5* 06/12/2014 0416   HCT 35.4* 12/13/2013 0941   PLT 130* 06/12/2014 0416   MCV 70.4* 06/12/2014 0416   MCV 78.5* 12/13/2013 0941   MCH 23.8* 06/12/2014 0416   MCH 24.4* 04/27/2014 0944   MCH 24.6* 12/13/2013 0941   MCHC 33.7 06/12/2014 0416  MCHC 31.2* 04/27/2014 0944   MCHC 31.4* 12/13/2013 0941   RDW 18.1* 06/12/2014 0416   RDW 16.4* 04/27/2014 0944   LYMPHSABS 1.1 05/16/2014 1606   LYMPHSABS 1.5 04/27/2014 0944   MONOABS 0.6 05/25/2014 1606   EOSABS 0.1 05/01/2014 1606   EOSABS 0.2 04/27/2014 0944   BASOSABS 0.0 05/07/2014 1606   BASOSABS 0.0 04/27/2014 0944    BMET    Component Value Date/Time   NA 121* 06/12/2014 0416   NA 133* 05/24/2014 1245   K 3.8 06/12/2014 0416   CL 81* 06/12/2014 0416   CO2 26 06/12/2014 0416   GLUCOSE 137* 06/12/2014 0416   GLUCOSE 141* 05/24/2014 1245   BUN 72* 06/12/2014 0416   BUN 34* 05/24/2014 1245   CREATININE 3.50* 06/12/2014 0416   CREATININE 1.77* 04/11/2014 0830   CALCIUM 9.0 06/12/2014 0416   GFRNONAA 17* 06/12/2014 0416   GFRNONAA 64  10/20/2012 0852   GFRAA 19* 06/12/2014 0416   GFRAA 74 10/20/2012 0852    CMP     Component Value Date/Time   NA 121* 06/12/2014 0416   NA 133* 05/24/2014 1245   K 3.8 06/12/2014 0416   CL 81* 06/12/2014 0416   CO2 26 06/12/2014 0416   GLUCOSE 137* 06/12/2014 0416   GLUCOSE 141* 05/24/2014 1245   BUN 72* 06/12/2014 0416   BUN 34* 05/24/2014 1245   CREATININE 3.50* 06/12/2014 0416   CREATININE 1.77* 04/11/2014 0830   CALCIUM 9.0 06/12/2014 0416   PROT 6.2 06/12/2014 0416   PROT 5.9* 05/24/2014 1245   ALBUMIN 3.2* 06/12/2014 0416   AST 22 06/12/2014 0416   ALT 26 06/12/2014 0416   ALKPHOS 95 06/12/2014 0416   BILITOT 1.8* 06/12/2014 0416   BILITOT 2.5* 05/24/2014 1245   GFRNONAA 17* 06/12/2014 0416   GFRNONAA 64 10/20/2012 0852   GFRAA 19* 06/12/2014 0416   GFRAA 74 10/20/2012 0852    Assessment and Plan: 1. Code Status: FULL 2. Symptom Control: 1. Neuropathy in feet: May consider capsaicin 0.075% QID. Continue Vicodin every 6 hours prn.  2. Weakness/fatigue: Continue management for heart failure team. This has improved on milrinone along with increased appetite and intake. 3. Nausea: Relieved with Zofran - continue prn.  3. Psycho/Social: Emotional support provided to patient and family at bedside during difficult conversation.  4. Disposition: Home when medically stable.    Time In Time Out Total Time Spent with Patient Total Overall Time  1340 1405 58min 58min    Greater than 50%  of this time was spent counseling and coordinating care related to the above assessment and plan.  Vinie Sill, NP Palliative Medicine Team Pager # [phone] (M-F 8a-5p) Team Phone # [phone] (Nights/Weekends)

## 2014-06-13 ENCOUNTER — Inpatient Hospital Stay (HOSPITAL_COMMUNITY): Payer: Commercial Managed Care - HMO

## 2014-06-13 LAB — CARBOXYHEMOGLOBIN
Carboxyhemoglobin: 2.1 % — ABNORMAL HIGH (ref 0.5–1.5)
Methemoglobin: 1.3 % (ref 0.0–1.5)
O2 SAT: 70.4 %
Total hemoglobin: 8.6 g/dL — ABNORMAL LOW (ref 13.5–18.0)

## 2014-06-13 LAB — CBC
HEMATOCRIT: 25.3 % — AB (ref 39.0–52.0)
Hemoglobin: 8.8 g/dL — ABNORMAL LOW (ref 13.0–17.0)
MCH: 24.3 pg — ABNORMAL LOW (ref 26.0–34.0)
MCHC: 34.8 g/dL (ref 30.0–36.0)
MCV: 69.9 fL — ABNORMAL LOW (ref 78.0–100.0)
Platelets: 97 10*3/uL — ABNORMAL LOW (ref 150–400)
RBC: 3.62 MIL/uL — ABNORMAL LOW (ref 4.22–5.81)
RDW: 18.4 % — AB (ref 11.5–15.5)
WBC: 7.4 10*3/uL (ref 4.0–10.5)

## 2014-06-13 LAB — BASIC METABOLIC PANEL
ANION GAP: 12 (ref 5–15)
BUN: 81 mg/dL — ABNORMAL HIGH (ref 6–23)
CALCIUM: 8.5 mg/dL (ref 8.4–10.5)
CO2: 27 mmol/L (ref 19–32)
Chloride: 78 mmol/L — ABNORMAL LOW (ref 96–112)
Creatinine, Ser: 4.55 mg/dL — ABNORMAL HIGH (ref 0.50–1.35)
GFR calc Af Amer: 14 mL/min — ABNORMAL LOW (ref >90)
GFR calc non Af Amer: 12 mL/min — ABNORMAL LOW (ref >90)
Glucose, Bld: 258 mg/dL — ABNORMAL HIGH (ref 70–99)
POTASSIUM: 4 mmol/L (ref 3.5–5.1)
SODIUM: 117 mmol/L — AB (ref 135–145)

## 2014-06-13 LAB — URINE MICROSCOPIC-ADD ON

## 2014-06-13 LAB — URINALYSIS, ROUTINE W REFLEX MICROSCOPIC
Glucose, UA: 100 mg/dL — AB
Hgb urine dipstick: NEGATIVE
KETONES UR: 15 mg/dL — AB
Leukocytes, UA: NEGATIVE
NITRITE: NEGATIVE
PROTEIN: 100 mg/dL — AB
SPECIFIC GRAVITY, URINE: 1.017 (ref 1.005–1.030)
UROBILINOGEN UA: 1 mg/dL (ref 0.0–1.0)
pH: 5 (ref 5.0–8.0)

## 2014-06-13 LAB — GLUCOSE, CAPILLARY
GLUCOSE-CAPILLARY: 189 mg/dL — AB (ref 70–99)
GLUCOSE-CAPILLARY: 195 mg/dL — AB (ref 70–99)
Glucose-Capillary: 153 mg/dL — ABNORMAL HIGH (ref 70–99)

## 2014-06-13 LAB — HEPARIN LEVEL (UNFRACTIONATED): HEPARIN UNFRACTIONATED: 0.63 [IU]/mL (ref 0.30–0.70)

## 2014-06-13 LAB — PROTIME-INR
INR: 1.36 (ref 0.00–1.49)
Prothrombin Time: 16.9 seconds — ABNORMAL HIGH (ref 11.6–15.2)

## 2014-06-13 MED ORDER — FUROSEMIDE 10 MG/ML IJ SOLN
80.0000 mg | Freq: Four times a day (QID) | INTRAMUSCULAR | Status: DC
  Administered 2014-06-13 – 2014-06-18 (×18): 80 mg via INTRAVENOUS
  Filled 2014-06-13 (×24): qty 8

## 2014-06-13 MED ORDER — OXYMETAZOLINE HCL 0.05 % NA SOLN
1.0000 | Freq: Two times a day (BID) | NASAL | Status: DC | PRN
  Filled 2014-06-13: qty 15

## 2014-06-13 MED ORDER — DARBEPOETIN ALFA 100 MCG/0.5ML IJ SOSY
100.0000 ug | PREFILLED_SYRINGE | INTRAMUSCULAR | Status: DC
  Administered 2014-06-13: 100 ug via SUBCUTANEOUS
  Filled 2014-06-13 (×2): qty 0.5

## 2014-06-13 MED ORDER — WARFARIN SODIUM 7.5 MG PO TABS
7.5000 mg | ORAL_TABLET | Freq: Once | ORAL | Status: AC
  Administered 2014-06-13: 7.5 mg via ORAL
  Filled 2014-06-13: qty 1

## 2014-06-13 MED ORDER — FENTANYL CITRATE 0.05 MG/ML IJ SOLN: 12.5000 ug | Freq: Once | INTRAMUSCULAR | Status: DC

## 2014-06-13 NOTE — Progress Notes (Signed)
Checked on pt in am and he was vomiting. This pm pt feels bad with feet pain. Will f/u tomorrow. Ethelda ChickKristan Mima Cranmore CES, ACSM 2:49 PM 06/13/2014

## 2014-06-13 NOTE — Progress Notes (Signed)
Mr. Ronne BinningWray has had a very difficult morning with nose bleeds, nausea/vomiting, and now worsening foot pain. He is lying in bed in tears when I came in. I have ordered a one time dose fentanyl 12.5 mg IV for pain (he is still nauseated and Vicodin will not be a good idea at this time). He has completed Advance Directive and awaiting notary - I have spoken with chaplain office about this. I worry about his worsening renal function and fear with his cardiac history dialysis would be very difficult. I will try and discuss this more with Mr. Ronne BinningWray depending on trending renal function when he is in less distress.   Yong ChannelAlicia Zionna Homewood, NP Palliative Medicine Team Pager # [phone] (M-F 8a-5p) Team Phone # [phone] (Nights/Weekends)

## 2014-06-13 NOTE — Consult Note (Signed)
[facility] Renal Consultation Note  Requesting MD: Aundra Dubin Indication for Consultation: AKI in the setting of CHF/milrinone/hypotension and volume overload  HPI:  Luke Pittman is a 69 y.o. male with past medical history significant for ischemic cardiomyopathy, EF 20-25 %, DM, hyperlipidemia admitted for heart failure.  Regarding his renal function, creatinine really normal around 1.1 until early 2015- then seemed to run in the 1.4 to 1.6 range through 2015 and early 2016 including the start of this hospitalization.  He was noted to diurese well, from 78 kg down to around 70 kg also associated with some low blood pressures (one BP of 38'B systolic recorded) then creatinine started trending up on 3/5- stabilized on 3/9- then started a daily upward trend- at first rising 0.2 daily , now 0.5 daily with a creatinine of 4.5 today.  He was on lisiniprol early in the hospitalization but that was stopped.  His UOP had remained adequate until today when it has fallen off.  He claims that he feels well.  He has been having some nausea and nosebleeds but he says the nausea predated his admission.  He has a bit of a strange affect but I do not know his baseline.  He has seen palliative care since being here and is a full code.  He is requiring milrinone from a heart failure standpoint.  He has been off and on diuretics, currently off.  He has pitting edema and a CVP as high as 20 but is on room air.  Has developed significant hyponatremia as well.  No recent U/A- ultrasound done on 2/28 did not show hydro- kidneys were 11 plus cm.   CREAT  Date/Time Value Ref Range Status  04/11/2014 08:30 AM 1.77* 0.50 - 1.35 mg/dL Final  10/20/2012 08:52 AM 1.17 0.50 - 1.35 mg/dL Final   CREATININE, SER  Date/Time Value Ref Range Status  06/13/2014 03:55 AM 4.55* 0.50 - 1.35 mg/dL Final  06/12/2014 04:16 AM 3.50* 0.50 - 1.35 mg/dL Final  06/11/2014 03:30 AM 3.02* 0.50 - 1.35 mg/dL Final  06/10/2014 03:40 AM  2.61* 0.50 - 1.35 mg/dL Final  06/19/2014 03:45 AM 2.44* 0.50 - 1.35 mg/dL Final  06/08/2014 05:15 AM 2.23* 0.50 - 1.35 mg/dL Final  06/07/2014 04:20 AM 1.92* 0.50 - 1.35 mg/dL Final  06/06/2014 08:00 AM 2.03* 0.50 - 1.35 mg/dL Final  06/05/2014 03:45 AM 2.12* 0.50 - 1.35 mg/dL Final  06/04/2014 03:54 AM 2.17* 0.50 - 1.35 mg/dL Final  06/03/2014 02:00 PM 1.99* 0.50 - 1.35 mg/dL Final  06/03/2014 08:16 AM 1.88* 0.50 - 1.35 mg/dL Final  06/03/2014 06:40 AM 1.78* 0.50 - 1.35 mg/dL Final  06/02/2014 05:00 AM 1.38* 0.50 - 1.35 mg/dL Final  06/01/2014 04:00 PM 1.36* 0.50 - 1.35 mg/dL Final  06/01/2014 04:00 AM 1.38* 0.50 - 1.35 mg/dL Final  05/31/2014 03:13 AM 1.46* 0.50 - 1.35 mg/dL Final  06/11/2014 11:25 AM 1.55* 0.50 - 1.35 mg/dL Final  06/20/2014 12:13 AM 1.51* 0.50 - 1.35 mg/dL Final  05/03/2014 04:06 PM 1.41* 0.50 - 1.35 mg/dL Final  05/24/2014 12:45 PM 1.50* 0.76 - 1.27 mg/dL Final  04/27/2014 09:44 AM 1.65* 0.76 - 1.27 mg/dL Final  12/13/2013 09:18 AM 1.39* 0.76 - 1.27 mg/dL Final  11/15/2013 08:08 AM 1.49* 0.76 - 1.27 mg/dL Final  10/26/2013 12:52 PM 1.42* 0.76 - 1.27 mg/dL Final  10/05/2013 05:05 AM 1.64* 0.50 - 1.35 mg/dL Final  10/04/2013 07:20 AM 1.67* 0.50 - 1.35 mg/dL Final  10/03/2013 05:58 PM 1.67* 0.50 -  1.35 mg/dL Final  10/03/2013 02:21 PM 1.72* 0.76 - 1.27 mg/dL Final  08/04/2013 08:03 AM 1.50* 0.76 - 1.27 mg/dL Final  04/07/2013 09:59 AM 1.16 0.76 - 1.27 mg/dL Final  01/06/2012 04:13 PM 1.32 0.50 - 1.35 mg/dL Final  11/20/2011 05:25 AM 1.12 0.50 - 1.35 mg/dL Final  11/19/2011 05:18 AM 1.32 0.50 - 1.35 mg/dL Final  11/18/2011 05:43 AM 1.15 0.50 - 1.35 mg/dL Final  11/17/2011 11:20 PM 1.36* 0.50 - 1.35 mg/dL Final  11/17/2011 06:23 PM 1.36* 0.50 - 1.35 mg/dL Final  08/18/2011 11:53 AM 1.0 0.4 - 1.5 mg/dL Final  08/15/2011 10:14 PM 1.30 0.50 - 1.35 mg/dL Final  08/06/2011 05:26 AM 1.20 0.50 - 1.35 mg/dL Final  08/05/2011 01:57 PM 0.99 0.50 - 1.35 mg/dL Final   08/05/2011 01:40 AM 1.13 0.50 - 1.35 mg/dL Final  08/04/2011 11:03 PM 1.17 0.50 - 1.35 mg/dL Final  08/04/2011 02:20 PM 1.04 0.50 - 1.35 mg/dL Final     PMHx:   Past Medical History  Diagnosis Date  . CAD (coronary artery disease)     a. s/p CABG 1994;  b.  Patent grafts 07/2011  . Chronic systolic CHF (congestive heart failure)     a. EF 25% in 2005;  b. Echo 07/2011: EF 20-25%  . Anemia   . Hyperlipidemia   . Hyperkalemia     a. 07/2011 ->acei d/c'd.  . Ischemic cardiomyopathy   . Type II diabetes mellitus   . Stroke 01/05/2013    "possibility; lost hearing in left ear; not sure it wasn't caused by fluid pill"  . 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 Cardiac catheterization  1994; ~ 07/2011  . Coronary artery bypass graft  1994    "CABG X4", LIMA to the LAD, sequential SVG to first and second obtuse marginal, sequential SVG to the right coronary artery.  . Left heart catheterization with coronary/graft angiogram N/A 08/05/2011    Procedure: LEFT HEART CATHETERIZATION WITH Beatrix Fetters;  Surgeon: Larey Dresser, MD;  Location: [facility] CATH LAB;  Service: Cardiovascular;  Laterality: N/A;  . Right heart catheterization N/A 06/26/2014    Procedure: RIGHT HEART CATH;  Surgeon: Larey Dresser, MD;  Location[facility] CATH LAB;  Service: Cardiovascular;  Laterality: N/A;  . Right heart catheterization N/A 06/05/2014    Procedure: RIGHT HEART CATH;  Surgeon: Larey Dresser, MD;  Location: [facility] CATH LAB;  Service: Cardiovascular;  Laterality: N/A;    Family Hx:  Family History  Problem Relation Age of Onset  . Heart failure Mother   . Heart attack Father 70  . Heart attack Brother 90    Deceased at 8 from heart failure    Social History:  reports that he has quit smoking. His smoking use included Cigarettes. He has a .5 pack-year smoking history. He has never used smokeless tobacco. He reports that he drinks alcohol. He reports that  he does not use illicit drugs.  Allergies:  Allergies  Allergen Reactions  . Beta Adrenergic Blockers Other (See Comments)    Swollen hands, Per patient currently (AST4196) tolerating Coreg.  . Fish Oil Diarrhea  . Januvia [Sitagliptin Phosphate] Other (See Comments)    Leg cramps  . Tetanus-Diphtheria Toxoids Td Swelling    Medications: Prior to Admission medications   Medication Sig Start Date End Date Taking? Authorizing Provider  acetaminophen (TYLENOL) 650 MG CR tablet Take 650 mg by mouth every 8 (eight) hours  as needed for pain.   Yes Historical Provider, MD  acetic acid-hydrocortisone (VOSOL-HC) otic solution Place 4 drops into both ears 2 (two) times daily. 05/12/14  Yes Historical Provider, MD  aspirin 81 MG tablet Take 81 mg by mouth every morning.    Yes Historical Provider, MD  BENFOTIAMINE PO Take 250 mg by mouth daily.   Yes Historical Provider, MD  bumetanide (BUMEX) 2 MG tablet Take 2 mg by mouth as needed (for weight gain of 3lbs or more (174lbs)).  05/11/14  Yes Tammy Eckard, PHARMD  carvedilol (COREG) 3.125 MG tablet Take 1 tablet (3.125 mg total) by mouth at bedtime. 12/13/13 12/13/14 Yes Chipper Herb, MD  ezetimibe (ZETIA) 10 MG tablet Take 1 tablet (10 mg total) by mouth daily. Patient taking differently: Take 5 mg by mouth daily.  04/07/13  Yes Chipper Herb, MD  fluticasone [facility]) 50 MCG/ACT nasal spray Place 1 spray into both nostrils daily as needed for allergies or rhinitis.   Yes Historical Provider, MD  glimepiride (AMARYL) 4 MG tablet Take 0.5 tablets (2 mg total) by mouth daily before breakfast. 05/18/14  Yes Tammy Eckard, PHARMD  lisinopril (PRINIVIL,ZESTRIL) 5 MG tablet Take 2.5 mg by mouth daily.  05/11/14  Yes Tammy Eckard, PHARMD  Menthol-Methyl Salicylate (MUSCLE RUB) 10-15 % CREA Apply 1 application topically 3 (three) times daily.    Yes Historical Provider, MD  metFORMIN (GLUCOPHAGE) 500 MG tablet Take 1-3 tablets (500-1,500 mg total) by mouth 2  (two) times daily with a meal. Takes 511m in morning and 1500 in evening Patient taking differently: Take 500 mg by mouth 2 (two) times daily with a meal. Takes 5015min morning and 500 in evening 04/27/14  Yes DoChipper HerbMD  metolazone (ZAROXOLYN) 2.5 MG tablet Take 1 tablet by mouth daily as needed Patient taking differently: Take 2.5 mg by mouth as needed (for weight gain of 3 lbs or more (174 lbs)). Take 1 tablet by mouth daily as needed 04/12/14  Yes JaMinus BreedingMD  OVER THE COUNTER MEDICATION Take 1 capsule by mouth daily.   Yes Historical Provider, MD  pravastatin (PRAVACHOL) 80 MG tablet Take 1 tablet (80 mg total) by mouth daily. 12/13/13  Yes DoChipper HerbMD  silver sulfADIAZINE (SILVADENE) 1 % cream Apply 1 application topically daily.   Yes Historical Provider, MD    I have reviewed the patient's current medications.  Labs:  Results for orders placed or performed during the hospital encounter of 05/02/2014 (from the past 48 hour(s))  Glucose, capillary     Status: None   Collection Time: 06/11/14  5:07 PM  Result Value Ref Range   Glucose-Capillary 94 70 - 99 mg/dL   Comment 1 Capillary Specimen    Comment 2 Notify RN   Glucose, capillary     Status: Abnormal   Collection Time: 06/11/14  9:44 PM  Result Value Ref Range   Glucose-Capillary 154 (H) 70 - 99 mg/dL   Comment 1 Capillary Specimen   Heparin level (unfractionated)     Status: None   Collection Time: 06/12/14  4:15 AM  Result Value Ref Range   Heparin Unfractionated 0.58 0.30 - 0.70 IU/mL    Comment:        IF HEPARIN RESULTS ARE BELOW EXPECTED VALUES, AND PATIENT DOSAGE HAS BEEN CONFIRMED, SUGGEST FOLLOW UP TESTING OF ANTITHROMBIN III LEVELS.   Protime-INR     Status: Abnormal   Collection Time: 06/12/14  4:15 AM  Result  Value Ref Range   Prothrombin Time 16.9 (H) 11.6 - 15.2 seconds   INR 1.36 0.00 - 1.49  Comprehensive metabolic panel     Status: Abnormal   Collection Time: 06/12/14  4:16 AM   Result Value Ref Range   Sodium 121 (L) 135 - 145 mmol/L   Potassium 3.8 3.5 - 5.1 mmol/L   Chloride 81 (L) 96 - 112 mmol/L   CO2 26 19 - 32 mmol/L   Glucose, Bld 137 (H) 70 - 99 mg/dL   BUN 72 (H) 6 - 23 mg/dL   Creatinine, Ser 3.50 (H) 0.50 - 1.35 mg/dL   Calcium 9.0 8.4 - 10.5 mg/dL   Total Protein 6.2 6.0 - 8.3 g/dL   Albumin 3.2 (L) 3.5 - 5.2 g/dL   AST 22 0 - 37 U/L   ALT 26 0 - 53 U/L   Alkaline Phosphatase 95 39 - 117 U/L   Total Bilirubin 1.8 (H) 0.3 - 1.2 mg/dL   GFR calc non Af Amer 17 (L) >90 mL/min   GFR calc Af Amer 19 (L) >90 mL/min    Comment: (NOTE) The eGFR has been calculated using the CKD EPI equation. This calculation has not been validated in all clinical situations. eGFR's persistently <90 mL/min signify possible Chronic Kidney Disease.    Anion gap 14 5 - 15  CBC     Status: Abnormal   Collection Time: 06/12/14  4:16 AM  Result Value Ref Range   WBC 7.1 4.0 - 10.5 K/uL   RBC 3.62 (L) 4.22 - 5.81 MIL/uL   Hemoglobin 8.6 (L) 13.0 - 17.0 g/dL   HCT 25.5 (L) 39.0 - 52.0 %   MCV 70.4 (L) 78.0 - 100.0 fL   MCH 23.8 (L) 26.0 - 34.0 pg   MCHC 33.7 30.0 - 36.0 g/dL   RDW 18.1 (H) 11.5 - 15.5 %   Platelets 130 (L) 150 - 400 K/uL  Carboxyhemoglobin     Status: Abnormal   Collection Time: 06/12/14  4:30 AM  Result Value Ref Range   Total hemoglobin 8.8 (L) 13.5 - 18.0 g/dL   O2 Saturation 62.6 %   Carboxyhemoglobin 1.6 (H) 0.5 - 1.5 %   Methemoglobin 1.3 0.0 - 1.5 %  Glucose, capillary     Status: Abnormal   Collection Time: 06/12/14  7:20 AM  Result Value Ref Range   Glucose-Capillary 122 (H) 70 - 99 mg/dL   Comment 1 Capillary Specimen   Glucose, capillary     Status: Abnormal   Collection Time: 06/12/14 11:23 AM  Result Value Ref Range   Glucose-Capillary 164 (H) 70 - 99 mg/dL   Comment 1 Capillary Specimen   Glucose, capillary     Status: Abnormal   Collection Time: 06/12/14  4:15 PM  Result Value Ref Range   Glucose-Capillary 182 (H) 70 - 99  mg/dL   Comment 1 Capillary Specimen   Glucose, capillary     Status: Abnormal   Collection Time: 06/12/14  9:12 PM  Result Value Ref Range   Glucose-Capillary 174 (H) 70 - 99 mg/dL   Comment 1 Capillary Specimen   Protime-INR     Status: Abnormal   Collection Time: 06/13/14  3:55 AM  Result Value Ref Range   Prothrombin Time 16.9 (H) 11.6 - 15.2 seconds   INR 1.36 0.00 - 1.49  Heparin level (unfractionated)     Status: None   Collection Time: 06/13/14  3:55 AM  Result Value Ref Range  Heparin Unfractionated 0.63 0.30 - 0.70 IU/mL    Comment:        IF HEPARIN RESULTS ARE BELOW EXPECTED VALUES, AND PATIENT DOSAGE HAS BEEN CONFIRMED, SUGGEST FOLLOW UP TESTING OF ANTITHROMBIN III LEVELS.   CBC     Status: Abnormal   Collection Time: 06/13/14  3:55 AM  Result Value Ref Range   WBC 7.4 4.0 - 10.5 K/uL   RBC 3.62 (L) 4.22 - 5.81 MIL/uL   Hemoglobin 8.8 (L) 13.0 - 17.0 g/dL   HCT 25.3 (L) 39.0 - 52.0 %   MCV 69.9 (L) 78.0 - 100.0 fL   MCH 24.3 (L) 26.0 - 34.0 pg   MCHC 34.8 30.0 - 36.0 g/dL   RDW 18.4 (H) 11.5 - 15.5 %   Platelets 97 (L) 150 - 400 K/uL    Comment: PLATELET COUNT CONFIRMED BY SMEAR REPEATED TO VERIFY SPECIMEN CHECKED FOR CLOTS DELTA CHECK NOTED   Basic metabolic panel     Status: Abnormal   Collection Time: 06/13/14  3:55 AM  Result Value Ref Range   Sodium 117 (LL) 135 - 145 mmol/L    Comment: CRITICAL RESULT CALLED TO, READ BACK BY AND VERIFIED WITH: SWANSON,K RN 06/13/2014 0444 JORDANS REPEATED TO VERIFY    Potassium 4.0 3.5 - 5.1 mmol/L   Chloride 78 (L) 96 - 112 mmol/L   CO2 27 19 - 32 mmol/L   Glucose, Bld 258 (H) 70 - 99 mg/dL   BUN 81 (H) 6 - 23 mg/dL   Creatinine, Ser 4.55 (H) 0.50 - 1.35 mg/dL   Calcium 8.5 8.4 - 10.5 mg/dL   GFR calc non Af Amer 12 (L) >90 mL/min   GFR calc Af Amer 14 (L) >90 mL/min    Comment: (NOTE) The eGFR has been calculated using the CKD EPI equation. This calculation has not been validated in all clinical  situations. eGFR's persistently <90 mL/min signify possible Chronic Kidney Disease.    Anion gap 12 5 - 15  Carboxyhemoglobin     Status: Abnormal   Collection Time: 06/13/14  3:57 AM  Result Value Ref Range   Total hemoglobin 8.6 (L) 13.5 - 18.0 g/dL   O2 Saturation 70.4 %   Carboxyhemoglobin 2.1 (H) 0.5 - 1.5 %   Methemoglobin 1.3 0.0 - 1.5 %  Glucose, capillary     Status: Abnormal   Collection Time: 06/13/14  8:14 AM  Result Value Ref Range   Glucose-Capillary 189 (H) 70 - 99 mg/dL   Comment 1 Capillary Specimen   Glucose, capillary     Status: Abnormal   Collection Time: 06/13/14 11:36 AM  Result Value Ref Range   Glucose-Capillary 195 (H) 70 - 99 mg/dL   Comment 1 Capillary Specimen      ROS:  A comprehensive review of systems was negative except for: Ears, nose, mouth, throat, and face: positive for epistaxis Cardiovascular: positive for lower extremity edema Gastrointestinal: positive for nausea and vomiting  Physical Exam: Filed Vitals:   06/13/14 1200  BP: 103/46  Pulse: 87  Temp: 97.6 F (36.4 C)  Resp:      General: thin WM, a bit of a strange affect - wife is at bedside and she is asking appropriate questions HEENT: PERRLA, EOMI, mucous membranes moist  Neck: there is JVD present Heart: RRR Lungs: mostly clear Abdomen: slightly distended Extremities: pitting edema with compression hose on Skin: warm and dry Neuro: alert, standing up during the interview without announcement  Assessment/Plan: 69 year old  WM with ischemic cardiomyopathy and now AKI on CKD 1.Renal- A on CRF- suspect it is due to hemodynamic change in the setting of CHF/diuresis and hypotension- also ACE previously but has been off of it for quite a while.  Will check renal ultrasound again, since renal function has changed as well as a U/A.  He is not on any meds currently to lower his blood pressure to stop.  I feel that he needs diuresis.  This is a difficult situation.  I would hope  since the injury has taken place over a relatively short period of time that resolution was possible, however, feel like situation has been managed appropriately and it is not getting better, in fact trending worse.  I did mention to the patient and his wife that dialysis is a possibility if this does not turn around.  He would be willing to do dialysis if needed.  I am not sure he is the best candidate given his cardiac issues 2. Hypertension/volume  - Given the pitting edema, elevated CVP and hyponatremia, he seems volume overloaded - will resume lasix IV frequently so as not to drop his blood pressure too much 3. Anemia  - low but stable - CKD/AKI probably contributing- will check iron stores and add aranesp    Patrcia Schnepp A 06/13/2014, 1:58 PM

## 2014-06-13 NOTE — Progress Notes (Signed)
CRITICAL VALUE ALERT  Critical value received:  Sodium 117  Date of notification:  06/13/14  Time of notification:  0530  Critical value read back: yes  Nurse who received alert:  Rivka SpringKaylee Swanson RN   MD notified (1st page):  Cardiology Fellow on-call   Time of first page:  0530  MD notified (2nd page): Barret PA  Time of second page: 0620  Time MD responded: [phone]

## 2014-06-13 NOTE — Progress Notes (Signed)
11:15 patient had bout of nausea and vomiting, zofran administered, vomiting subsided, he has developed nose bleed since blowing his nose, applied ice to bridge of his nose, pressure applied, it has slowed to trickle of blood.  PA on unit spoke with him about bleed awaiting new orders.

## 2014-06-13 NOTE — Progress Notes (Signed)
   06/13/14 1100  Clinical Encounter Type  Visited With Patient;Patient and family together  Visit Type Initial;Spiritual support;Social support   Chaplain was referred to patient via spiritual care consult.  Consult indicated patient wanted  to create or update an advanced directive. Chaplain checked in with patient and patient's wife.  Patient appears to have an advanced directive completed but it needs to be signed and notarized. Chaplain is going to let the spiritual care department know and see if a chaplain can facilitate the document's completion later today. [place] NeighborStrother, Bryer Gottsch R, Chaplain  11:08 AM

## 2014-06-13 NOTE — Progress Notes (Signed)
[facility] CLINICAL SOCIAL WORK DOCUMENTATION LVAD (Left Ventricular Assist Device) Psychosocial Screening Please remember that all information is confidential within the members of the VAD team and [facility]  06/05/2014 12:30 PM  Patient:  NATHANAL HERMIZ  MRN:  [id number]  Account:  [id number]  Clinical Social Worker:  Merryl Hacker, LCSW Date/Time Initiated:   06/05/2014 12:30 PM Referral Source:    Hessie Diener, VAD Coordinator  Referral Reason:   LVAD Implantation  Source of Information:   Patient, wife- [place], patient's chart, VAD Coordinator   PATIENT DEMOGRAPHICS NAME:   Broox Lonigro     DOB:  April 06, 1945  SS#:  [id number] Address:   [address]  [facility]  [place] [address] Home Phone:  [phone]  Cell Phone:   [phone] Marital Status:   married     Primary Language:  ENGLISH  Faith:  [employer] Adherence with Medical Regimen:   compliant  Medication Adherence:   compliant  Physician appointment attendance:   compliant   Do you have a Living Will or Medical POA?  N Would you like to complete a Living Will and Medical POA prior to surgery?  Y Are you currently a DNR?  N Do you have a MOST form?  N Would you like to review one?  Y Do you have goals of care?  N   Have you had a consult with the palliative care team at [facility]?  N Comment:  Patient and wife open to discussion about goals of care and completeing Living Will/HPOA.  Psychological Health Appearance:   Patient was sitting up in bed and dressed in a hospital gown. Patient was well groomed and neatly dressed.  Mental status:   Alert and Oriented  Eye Contact:   Excellent  Thought Content:   Patient verbalized thoughts clearly and answered appropriately  Speech:   strong and clear  Mood:   Patient was in good spirits  Affect:   Positive and humorous at times  Insight:   realsitic  Judgment:   sound  Interaction Style:   realsitic   Family/Social  Information Who lives in your home? Name9  Callaway Hailes   Age9  [phone]   Relationship to you9  wife    Do you have a plan for child care if relevant?   n/a  List family members outside the home (parents, friends, pastor, etc..) Name2  Azalia Bilis  Lauren   Age2  69yo   Relationship to you2  son (lives in [employer] [place])  daughter (lives in [place], [place])    Please list people who give you emotional support (family, parents, friends, pastor, etc..) Name3  Danie Chandler    Relationship to [phone]  pastor    Who is your primary and backup support pre and post-surgery? Explain the relationships i.e. strengths/weakness, etc.:   [place] Sink will be primary and appears very loving and supportive. She is available 24/7 for the first few weeks of recovery.  Secondary Caregiver identified:   Chrissie Noa- son lives nearby and will be back up caregiver.  Alanda Slim- sister will also assist with back up caregiving   Legal Do you currently have any legal issues/problems?   denies any concerns  Durable POA or Legal Next of Kin:   none at this time   Living situation Travel distance to [facility]:  30 miles  Second Hand Smoke Exposure:   none  Self- Care level:   Independent  Ambulation:   Independent  Transportation:  Drives self  Limitations:   no limits  Barriers impacting ability to participate in care:   Patient denies any current barriers   Community Are you active with community agencies/resources?   no  Are you active in a church, synagogue, mosque, or other faith based community?   [employer] [facility] [facility]  What other sources do you have for spiritual support?   no  Are you active in any clubs or social organizations?   no  What do you do for fun? hobbies, interests?  mow yards and clean up old cars   Education/Work information What is the last grade of school you completed?  45yr college  Preferred method of learning? (Written, verbal, hands on):    hands on  Do you have any problems with reading or writing?  slow but just need time. no issues with comprehension  Are you currently employed? If no, when were you last employed?   no  Name of employer:   [employer]  Please describe what kind of work you do/did?   quality control in cotton [employer]  How long have you worked there?   40+ years  If you are not currently working, do you plan to return to work after VAD surgery?   no/retired  If yes, what type of employment do you hope to find?   n/a  Are you interested in job training or learning new skills?   no  Did you serve in the Military? If so, what branch?   [employer]- 2years ([place]   Financial Information What is your source of income?    SSA/income from retirement  Do you have difficulty meeting your monthly expenses?   no  If yes, which ones?   How do you usually cope with this?   Primary Health Insurance:    [employer]age  Secondary Insurance:    none  Have you applied for Medicaid?    no  Have you applied for Social Security Disability?    n/a  Do you have prescription coverage?    yes  What are your prescription co-pays?    varies  Are you required to use a certain pharmacy?    [facility]  Do you have a mail order option for your prescriptions?    yes  If yes, what pharmacy do you use for mail order?    [facility]  Have you ever refused medication due to cost?    no  Discuss monthly cost for dressing supplies post procedure $150-300    discussed and reviewed  Can you budget for this monthly expense?    yes   Medical Information Briefly describe your medical history, surgeries and why you are here for evaluation.    Patient reports he started with swelling related to CHF and then had open heart 4 bypass surgery in 1994. He noted decline in 2014 and increased CHF symptoms in 2016.  Are you able to complete your ADL's?    Independent  Do you have any history of emotional, medical, physical or  verbal trauma?    denies  Do you have any family history of heart problems?    Mother, Father and Brother  Do you smoke? If so, what is the amount and frequency?    as a teenager but not since  Do you drink alcohol? If so, how many drinks a day/week?    has not had a drink since getting married 45 years ago.  Have you ever used illegal  drugs or misused medications?    no  If yes, what drugs do you use and how often?   Have you ever been treated for substance abuse?    no  If yes, where and when were you treated?   Are you currently using illegal substances?    no   Mental Health History Have you ever had problems with depression, anxiety or other mental health issues?   no  If yes, have you seen a counselor, psychiatrist or therapist?    n/a  If you are currently experiencing problems are you interested in talking with a professional?    no  Would you be interested in participating in an LVAD support group?  Y LVAD support group for: Patient Caregivers patient Have you or are you taking any medications for anxiety/depression or any mental health concerns?    none  If yes, Please list the medications?   How have you been feeling in the past year?    not feeling weel and not sleeping  How do you handle stressful situations?   "I try to go somewhere to air it out" [place]  What are your coping strategies? Please List:    work through it  Have you had any past or current thoughts of suicide?    not really  How many hours do you sleep at night?    3-4 hours a night  How is your appetite?   good  PHQ2- Depression Screen:    3  PHQ9 Depression screen (only complete if the PHQ2 is positive):    Plan for VAD Implementation Do you know and understand what happens during VAD surgery?  Please describe your thoughts:   Patient's understanding of VAD surgery is realistic and accurate. He described the procedure and the recovery plan post surgery.  What do you know about  the risks of any major surgery or use of general anesthesia?    Patient has a clear understanding of the risks of death and infection with the procedure.  What do you know about the risks and side effects associated with VAD surgery?    Patient  verbalizes understanding of the risks  Explain what will happen right after surgery?    Patient vernalizes understanding of procedure/surgery, recovery room and then transfer to ICU while intubated. After time and stablized will be transferred to regular floor and then discharge home.  Information obtained from:    Patient and wife  What is your plan for transportation for the first 8 weeks post-surgery? (Patients are not recommended to drive post-surgery for 8 weeks)    wife  Driver: Waynard Reedsita Whitenack  Valid license:    yes  Working vehicle/last inspection:    2007 Honda Accord  Airbags:  yes Do you plan to disarm the airbags- there is a risk of discharging the device if the airbag were to deploy.   discussed and reviewed  What do you know about your diet post-surgery?    heart healthy  How do you plan to monitor your medications, current and future?    Patient reports that he currently puts his medications in a drawer although wife states she will manage medications going forward.  How do you plan to complete ADL's post-surgery (Shower, dress, etc.)?    Wife will assist as needed  Will it be difficult to ask for help for your caregivers? If so, explain:    Patient dneies any concerns  Please explain what you hope will be  improved about your life as a result of receiving the VAD    "I will have more strength ad get back to regular speed"  Please tell me your biggest concern or fear about receiving the VAD?    "The financial part"  How do you cope with your concerns/fears?    Patient states that he will work through it.  Please explain your understanding of how your body will change? Are you worried about these changes?    He denied concerns but  stated "I don't know"  Do you see any barriers to your surgery or follow up? If yes, please explain:   He denied any surgical fears/barriers but mentioned concerns about previous cramps "taking fluid off".     Understanding of LVAD Surgical procedures and risks:    Discussed and reviewed  Electrical need for LVAD:    Patient and wife verbalized understanding of electrcal need and have 3 prong outlets.  Safety precautions with LVAD (Water, etc.):   discussed and reviewed  Potential side effects with LVAD:    discussed and reviewed  Types of Advanced heart failure therapies available:    discussed and reviewed  LVAD daily self-care (dressing changes, computer check, extra supplies):    discussed and reviewed. Patient and wife deny any concerns  Outpatient follow-up (follow-up in LVAD clinic; monitoring blood thinners)    discussed and reviewed  Need for emergency planning:    discussed and reviewed  Expectations for LVAD:    Patient states that he hopes "to live reasonably good"  Current level of motivation to prepare for LVAD:    Patient appears motivated at this time  Patient's perception of need for LVAD:    Patient reports "ready"  Present level of consent for LVAD:    Patient reports "ready"  Reasons for seeking LVAD:    Patient states that he is getting slow and won't be much left.   Psychosocial Protective Factors  Mr. Schriver appears to have a positive outlook with good coping skills and a very supportive wife to assist with care needs. He is compliant with his medical regimen and medications. His adult children are very supportive and involved and son lives close by. He is independent with his care and denies any barriers to recovery. He has a good support from his [employer] and enjoys odd jobs for fun. He is retired and states no issues with household expenses. He denies any issues with obtaining prescriptions or budgeting for medical expenses. He denies any history of  trauma, tobacco use, substance abuse. he has never been treated for substance abuse or misuse of medications. He denies any mental health history. He has no legal issues. He and wife verbalized understanding of recovery, follow up and transportation needs post surgery. Psychosocial Risk Factors  Patient has not completed a Living Will/HPOA although discussed at length and will consider before surgery. Patient does not have a secondary insurance. Wife admits to anxiety and stress over current situation but appears to have good coping skills and supportive family.  Clinical Intervention: CSW will provide supportive intervention to both patient and wife and encourage to attend LVAD Support Group. CSW will refer to Spiritual Care when ready to complete Living Will.   Educated patient/family on the following Caregiver(s) role responsibilities:   Discussed caregiver role and wife agreeable to 24/7 for the first few weeks and as long as needed.  Financial planning for LVAD:    Discussed potential financial concerns and wife asked about  insurance authorization prior to surgery.  Role of Clinical Social Worker:    Discussed role and support offered  Signs of depression and anxiety:    No signs present but discussed signs and symptoms and offered support.  Support planning for LVAD:    Discussed and reviewed  LVAD process:    Discussed and reviewed- most provided by LVAD Coordinator  Caregiver contract/agreement:   Discussed and reviewed- information and contract provided by LVAD Coordinator  Discussed Referral(s) to:    LVAD Support Group  Spiritual Care for Advanced Directives  Community Resources:    None at this time- CSW will continue to assess throughout recovery  Clinical Impression Recommendations:    Mr. Coble is a good candidate for the LVAD implantation.  He verbalized understanding of his needs and motivated to improve his health. 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 He verbalized understanding of life after LVAD surgery and need for electrical and emergency planning. He seems motivated and hopeful for improved life. CSW will follow for supportive intervention to both patient and wife and encourage LVAD support group and buddy program. Lasandra Beech, LCSW [phone]

## 2014-06-13 NOTE — Progress Notes (Signed)
Pt was walked outside this evening with nurse. Pt tolerated ambulation well. If possible pt would like to go back outside again tomorrow.

## 2014-06-13 NOTE — Progress Notes (Signed)
ANTICOAGULATION CONSULT NOTE - Follow Up Consult  Pharmacy Consult for heparin + warfarin Indication: atrial fibrillation  Allergies  Allergen Reactions  . Beta Adrenergic Blockers Other (See Comments)    Swollen hands, Per patient currently ([id number](Jul2015) tolerating Coreg.  . Fish Oil Diarrhea  . Januvia [Sitagliptin Phosphate] Other (See Comments)    Leg cramps  . Tetanus-Diphtheria Toxoids Td Swelling    Patient Measurements: Height: 5\' 11"  (180.3 cm) Weight: 161 lb 1.6 oz (73.074 kg) IBW/kg (Calculated) : 75.3 Heparin Dosing Weight: 70.7 kg  Vital Signs: Temp: 97.5 F (36.4 C) (03/15 0345) Temp Source: Oral (03/15 0345) BP: 102/50 mmHg (03/15 0345) Pulse Rate: 83 (03/14 2003)  Labs:  Recent Labs  06/11/14 0330 06/12/14 0415 06/12/14 0416 06/13/14 0355  HGB  --   --  8.6* 8.8*  HCT  --   --  25.5* 25.3*  PLT  --   --  130* 97*  LABPROT 17.4* 16.9*  --  16.9*  INR 1.41 1.36  --  1.36  HEPARINUNFRC 0.58 0.58  --  0.63  CREATININE 3.02*  --  3.50* 4.55*    Estimated Creatinine Clearance: 16.1 mL/min (by C-G formula based on Cr of 4.55).   Medications:  Scheduled:  . acetic acid-hydrocortisone  4 drop Right Ear QHS  . amiodarone  400 mg Oral BID  . aspirin  81 mg Oral Daily  . ezetimibe  10 mg Oral Daily  . fluticasone  2 spray Each Nare QHS  . insulin aspart  0-15 Units Subcutaneous TID WC  . insulin aspart  0-5 Units Subcutaneous QHS  . insulin detemir  10 Units Subcutaneous QHS  . lactulose  20 g Oral Daily  . Mega Benfotiamine Antioxidant Capsule  1 capsule Oral Daily  . nateglinide  60 mg Oral TID WC  . pantoprazole  40 mg Oral Q1200  . pravastatin  80 mg Oral q1800  . silver sulfADIAZINE   Topical BID  . sodium chloride  3 mL Intravenous Q12H  . Warfarin - Pharmacist Dosing Inpatient   Does not apply q1800   Infusions:  . heparin 1,350 Units/hr (06/12/14 2218)  . milrinone 0.5 mcg/kg/min (06/12/14 1640)    Assessment: 69 yo male admitted with  CHF exacerbation and was undergoing LVAD workup but poor RV function and Cr rising. Plan is  referral to transplant center for evaluation.  Heparin drip started as inpatient for new Afib - Heparin drip 1350 uts/hr HL at goal 0.63.   Warfarin initiation was delayed d/t LVAD w/u but now not a candidate.  Warfarin started 3/13 INR 1.36. CBC stable, no bleeding  No NOAC d/t renal dysfunction. Amio - new for Afib - monitor for warfarin sensitivity.  Goal: INR 2-3 Heparin level 0.3-0.7 units/ml Monitor platelets by anticoagulation protocol: Yes   Plan:  -Continue heparin gtt 1350 units/hr -Warfarin 7.5mg  x1 today -Discontinue heparin gtt when INR >2 -Monitor daily HL, INR, CBC, s/sx of bleeding  Tad MooreJessica Talin Feister, Pharm D, BCPS  Clinical Pharmacist Pager [phone]  06/13/2014 8:01 AM

## 2014-06-13 NOTE — Consult Note (Signed)
Met with the patient and wife, Luke Pittman, at bedside to offer [facility] services as benefit of [employer]. Patient agreeable to services and will receive post hospital discharge call and will be evaluated for monthly home visits. Consent form signed and the folder with [facility] services given.  Of note, [facility] services does not replace or interfere with any services that are arranged by inpatient case management or social work. For additional questions or referrals please contact: Natividad Brood, RN BSN [facility] Liaison  [phone] business mobile phone

## 2014-06-13 NOTE — Progress Notes (Addendum)
Patient ID: Luke Pittman, male   DOB: 19-May-1945, 69 y.o.   MRN: [id number]   SUBJECTIVE:   RHC 3/11 with improved hemodynamics. Milrinone gtt now at 0.5. Lasix and tolvaptan stopped 3/12 due to rising creatinine.  Creatinine up to 4.55 today with Na 117 and CVP 20.  Co-ox 70%.  Weight up 4 lbs.  He says he feels ok, no significant dyspnea, still doing some walking in halls.  Mild tremor.   ECHO EF 20% mild LVH  RV mod-severely dilated with moderate-severe systolic dysfunction .    RHC 3/11 (on milrinone) RA mean 14 RV 53/15 PA 55/21, mean 34 PCWP mean 25 Oxygen saturations: PA 61% AO 95% Cardiac Output (Fick) 6.07  Cardiac Index (Fick) 3.19 PVR 1.5 WU  Scheduled Meds: . acetic acid-hydrocortisone  4 drop Right Ear QHS  . amiodarone  400 mg Oral BID  . aspirin  81 mg Oral Daily  . ezetimibe  10 mg Oral Daily  . fluticasone  2 spray Each Nare QHS  . insulin aspart  0-15 Units Subcutaneous TID WC  . insulin aspart  0-5 Units Subcutaneous QHS  . insulin detemir  10 Units Subcutaneous QHS  . lactulose  20 g Oral Daily  . Mega Benfotiamine Antioxidant Capsule  1 capsule Oral Daily  . nateglinide  60 mg Oral TID WC  . pantoprazole  40 mg Oral Q1200  . pravastatin  80 mg Oral q1800  . silver sulfADIAZINE   Topical BID  . sodium chloride  3 mL Intravenous Q12H  . Warfarin - Pharmacist Dosing Inpatient   Does not apply q1800   Continuous Infusions: . heparin 1,350 Units/hr (06/12/14 2218)  . milrinone 0.5 mcg/kg/min (06/12/14 1640)   PRN Meds:.sodium chloride, acetaminophen, HYDROcodone-acetaminophen, MUSCLE RUB, ondansetron (ZOFRAN) IV, sodium chloride, sodium chloride   Filed Vitals:   06/12/14 1613 06/12/14 2003 06/12/14 2338 06/13/14 0345  BP: 118/52 104/46 111/53 102/50  Pulse:  83    Temp: 97.4 F (36.3 C) 97.5 F (36.4 C) 97.6 F (36.4 C) 97.5 F (36.4 C)  TempSrc: Oral Oral Oral Oral  Resp: Height:      Weight:    161 lb 1.6 oz (73.074 kg)    SpO2: 98% 97% 98% 96%    Intake/Output Summary (Last 24 hours) at 06/13/14 0757 Last data filed at 06/13/14 0700  Gross per 24 hour  Intake  811.6 ml  Output    200 ml  Net  611.6 ml    LABS: Basic Metabolic Panel:  Recent Labs  40/98/11 0416 06/13/14 0355  NA 121* 117*  K 3.8 4.0  CL 81* 78*  CO2 26 27  GLUCOSE 137* 258*  BUN 72* 81*  CREATININE 3.50* 4.55*  CALCIUM 9.0 8.5   Liver Function Tests:  Recent Labs  06/12/14 0416  AST 22  ALT 26  ALKPHOS 95  BILITOT 1.8*  PROT 6.2  ALBUMIN 3.2*   No results for input(s): LIPASE, AMYLASE in the last 72 hours. CBC:  Recent Labs  06/12/14 0416 06/13/14 0355  WBC 7.1 7.4  HGB 8.6* 8.8*  HCT 25.5* 25.3*  MCV 70.4* 69.9*  PLT 130* 97*   Cardiac Enzymes: No results for input(s): CKTOTAL, CKMB, CKMBINDEX, TROPONINI in the last 72 hours. BNP: Invalid input(s): POCBNP D-Dimer: No results for input(s): DDIMER in the last 72 hours. Hemoglobin A1C: No results for input(s): HGBA1C in the last 72 hours. Fasting Lipid Panel: No results for input(s):  CHOL, HDL, LDLCALC, TRIG, CHOLHDL, LDLDIRECT in the last 72 hours. Thyroid Function Tests: No results for input(s): TSH, T4TOTAL, T3FREE, THYROIDAB in the last 72 hours.  Invalid input(s): FREET3 Anemia Panel: No results for input(s): VITAMINB12, FOLATE, FERRITIN, TIBC, IRON, RETICCTPCT in the last 72 hours.  RADIOLOGY: Ct Abdomen Pelvis Wo Contrast  06/05/2014   CLINICAL DATA:  69 year old with cardiomyopathy. Preop evaluation prior to placement of left ventricular assist device.  EXAM: CT ABDOMEN AND PELVIS WITHOUT CONTRAST  TECHNIQUE: Multidetector CT imaging of the abdomen and pelvis was performed following the standard protocol without IV contrast.  COMPARISON:  Chest CT 06/04/2014  FINDINGS: Again noted is a small left pleural effusion. There is cardiomegaly without significant pericardial fluid. No evidence for free intraperitoneal air.  Unenhanced CT was  performed per clinician order. Lack of IV contrast limits sensitivity and specificity, especially for evaluation of abdominal/pelvic solid viscera.  There is subcutaneous edema. There is a moderate amount of ascites around the liver and within the pelvis. Small amount of ascites around the spleen. No gross abnormality to the liver, gallbladder or spleen. Spleen size is normal. Normal appearance of the pancreas, stomach and adrenal glands. Normal appearance of the kidneys, urinary bladder and prostate.  Atherosclerotic calcifications in the abdominal aorta without aneurysm. No significant lymphadenopathy involving the abdomen or pelvis. Normal appearance of the small bowel, large bowel and appendix. Mild disc space loss at L5-S1. Mild facet arthropathy in the lower lumbar spine.  IMPRESSION: Small-to-moderate amount of ascites. Subcutaneous edema and a small left pleural effusion.   Electronically Signed   By: Richarda Overlie M.D.   On: 06/05/2014 12:42   Dg Orthopantogram  06/05/2014   CLINICAL DATA:  Preoperative evaluation  EXAM: ORTHOPANTOGRAM/PANORAMIC  COMPARISON:  None  FINDINGS: Dental appliances present.  Multiple prior dental RIGHT extractions with dental amalgam at multiple remaining teeth.  No mandibular fracture or bone destruction identified.  IMPRESSION: No acute mandibular abnormalities.   Electronically Signed   By: Ulyses Southward M.D.   On: 06/05/2014 12:10   Dg Chest 2 View  05/16/2014   CLINICAL DATA:  Congestive failure  EXAM: CHEST  2 VIEW  COMPARISON:  10/03/2013  FINDINGS: Cardiac shadow remains enlarged. Postsurgical changes are again seen. No vascular congestion is seen. Very minimal effusions are noted bilaterally. No focal infiltrate is noted. No bony abnormality is seen.  IMPRESSION: Small effusions.  No other significant abnormality is noted.   Electronically Signed   By: Alcide Clever M.D.   On: 05/18/2014 13:44   Ct Chest Wo Contrast  06/04/2014   CLINICAL DATA:  Shortness of breath,  history of heart failure  EXAM: CT CHEST WITHOUT CONTRAST  TECHNIQUE: Multidetector CT imaging of the chest was performed following the standard protocol without IV contrast.  COMPARISON:  05/31/2014  FINDINGS: Mild peribronchial wall thickening. Mild interstitial change with a few Kerley B-lines bilaterally. No alveolar opacification. Small bilateral pleural effusions larger on the left. No significant pericardial effusion.  Severe cardiac enlargement and coronary artery calcification. Mild thoracic aortic calcification.  Right PICC line tip extends to the cavoatrial junction.  Images through the upper abdomen show a small volume of ascites.  No acute musculoskeletal findings. No significant hilar or mediastinal adenopathy.  IMPRESSION: Findings consistent with congestive heart failure with mild interstitial pulmonary edema.   Electronically Signed   By: Esperanza Heir M.D.   On: 06/04/2014 16:08   US Abdomen Complete  05/25/2014   CLINICAL DATA:  Increased  liver function test. History of diabetes, chronic kidney disease and patient is taking Cymbalta  EXAM: ULTRASOUND ABDOMEN COMPLETE  COMPARISON:  None.  FINDINGS: Gallbladder: Cholelithiasis without pericholecystic fluid. Gallbladder wall wall thickening measuring 5.6 mm. Negative sonographic Murphy sign.  Common bile duct: Diameter: 2.5 mm  Liver: No focal lesion identified. Within normal limits in parenchymal echogenicity.  IVC: No abnormality visualized.  Pancreas: Limited visualization secondary to overlying bowel gas.  Spleen: Size and appearance within normal limits.  Right Kidney: Length: 11.5 cm. Echogenicity within normal limits. No mass or hydronephrosis visualized.  Left Kidney: Length: 11.6 cm. Echogenicity within normal limits. No mass or hydronephrosis visualized.  Abdominal aorta: No aneurysm visualized.  Other findings: There is a small amount of ascites. There are bilateral pleural effusions.  IMPRESSION: 1. Cholelithiasis. Gallbladder wall  thickening which may be secondary to intrinsic gallbladder wall abnormality suggest cholecystitis, but can also be seen in the setting of ascites and hepatocellular disease versus hypoalbuminemia versus fluid overload. The lateral etiologies are favored over cholecystitis. 2. Small amount of ascites. 3. Bilateral pleural effusions.   Electronically Signed   By: Elige Ko   On: 05/23/2014 14:56   Dg Chest Port 1 View  05/31/2014   CLINICAL DATA:  PICC line placement  EXAM: PORTABLE CHEST - 1 VIEW  COMPARISON:  05/15/2014  FINDINGS: Right-sided PICC line with the tip projecting over the cavoatrial junction. No focal consolidation or pneumothorax. Trace left pleural effusion. There is stable cardiomegaly. There is evidence of prior CABG.  Mild osteoarthritis of bilateral glenohumeral joints.  IMPRESSION: Right-sided PICC line with the tip projecting over the cavoatrial junction.   Electronically Signed   By: Elige Ko   On: 05/31/2014 16:43    PHYSICAL EXAM CVP 20 General: NAD Sitting on the side of the bed.  Neck: JVP 14-16, no thyromegaly or thyroid nodule.  Lungs: Decreased breath sounds at bases.  CV: Nondisplaced PMI.  Heart mildly tachy, regular S1/S2, no S3/S4, no murmur.  1+ edema 1/2 up lower legs bilaterally.  Abdomen: Soft, nontender, no hepatosplenomegaly, mildly distended  Psych: Normal affect. Extremities: No clubbing or cyanosis.    TELEMETRY: Reviewed telemetry pt in atrial flutter in 90s.   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  1. Acute on chronic systolic CHF: EF 16% with at least moderate RV dysfunction by echo. He does not have an ICD (has not wanted in past).  He presented with NYHA class IIIb symptoms (dyspnea and profound fatigue), poor appetite, and markedly elevated transaminases. He remains volume overloaded on exam. BP is stable. RHC showed elevated filling pressures and low output.  On initial RHC,  RA/PCWP = 0.75.  Milrinone gtt was begun 3/2 and he felt much better/more alert.  Milrinone turned down to 0.125 on 3/3 with fall in co-ox.  Milrinone back up to 0.25, co-ox 61% with CVP 22.  3/6 milrinone was increased to 0.375 mcg then up to 0.5 on 3/10 due to ongoing low output.  RHC repeated on 3/10, RA/PWCP better at 0.56 and good CI but filling pressures still high.  Now, creatinine rising and up to 4.55.  Co-ox 70% today.  He has been off diuretics since Saturday morning. He remains volume overloaded with weight coming up and CVP 20.  - Continue milrinone at 0.5.   - No beta blocker other than amiodarone given low output.   - Off lisinopril with hyperkalemia/elevated creatinine.    -  Diuretics stopped due to rising creatinine 3/12, leave off today.  - He has seen LVAD coordinator.  He will be a difficult candidate for LVAD due to RV dysfunction and now elevated creatinine.  At this point, think that we will be sending him home on milrinone gtt.  Will send to transplant center for evaluation if renal function recovers.  Will place [facility] for now at discharge.  2. AKI: Cardiorenal syndrome with suspected superimposed ATN in setting of low output.  Cardiacoutput good now by co-ox and CVP remains high.  Creatinine worse today. He is off diuretics and we are watching.  I am going to have nephrology see him today.  3. CAD: S/p CABG. Cath in 2013 with patent grafts. No chest pain. Continue ASA, LFTs back to normal so restarted statin.   4. Elevated transaminases: Suspect hepatic congestion from CHF.Abdominal US showed normal liver echotexture. 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  His LFTs have come back to normal.  5. Diabetes: added nateglinide per diabetes coordinator.  Not good candidate for metformin in the future with CKD/advancedHF.  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  7. Atrial fibrillation: Went into atrial fibrillation => flutter this admission on milrinone.  He is on  heparin gtt and amiodarone, HR 90s.   - Started warfarin given elevated creatinine (rather than NOAC).  - Now on po amiodarone.   - Will consider DCCV (can do TEE-guided DCCV) after full amiodarone load, would give it about 2 more weeks.  8. Hyponatremia: Fluid restrict, decrease to 1000 cc. He got tolvaptan initially but this was held with rising creatinine. He does not seem markedly symptomatic from the low sodium, likely given chronicity.  Would hold off on aggressive correction with 3% saline with minimal symptoms and marked volume overload.   Marca Anconaalton Yajaira Doffing  MD 06/13/2014 7:57 AM

## 2014-06-13 NOTE — Progress Notes (Signed)
Contacted Barrett PA to inform about Pt's Sodium 117, Creatinine 4.5, output last 12 hours and increased weight gain 2kg. No orders placed at this time. Will continue to monitor pt. Pt also observed to have increased flat affect.

## 2014-06-14 ENCOUNTER — Ambulatory Visit: Payer: Commercial Managed Care - HMO | Admitting: Cardiology

## 2014-06-14 DIAGNOSIS — R112 Nausea with vomiting, unspecified: Secondary | ICD-10-CM | POA: Diagnosis not present

## 2014-06-14 DIAGNOSIS — R1110 Vomiting, unspecified: Secondary | ICD-10-CM

## 2014-06-14 LAB — GLUCOSE, CAPILLARY
GLUCOSE-CAPILLARY: 128 mg/dL — AB (ref 70–99)
Glucose-Capillary: 300 mg/dL — ABNORMAL HIGH (ref 70–99)
Glucose-Capillary: 240 mg/dL — ABNORMAL HIGH (ref 70–99)
Glucose-Capillary: 181 mg/dL — ABNORMAL HIGH (ref 70–99)
Glucose-Capillary: 192 mg/dL — ABNORMAL HIGH (ref 70–99)
Glucose-Capillary: 167 mg/dL — ABNORMAL HIGH (ref 70–99)

## 2014-06-14 LAB — CBC
HCT: 24.2 % — ABNORMAL LOW (ref 39.0–52.0)
Hemoglobin: 8.1 g/dL — ABNORMAL LOW (ref 13.0–17.0)
MCH: 23.3 pg — AB (ref 26.0–34.0)
MCHC: 33.5 g/dL (ref 30.0–36.0)
MCV: 69.7 fL — ABNORMAL LOW (ref 78.0–100.0)
Platelets: 142 10*3/uL — ABNORMAL LOW (ref 150–400)
RBC: 3.47 MIL/uL — ABNORMAL LOW (ref 4.22–5.81)
RDW: 18.4 % — AB (ref 11.5–15.5)
WBC: 7.3 10*3/uL (ref 4.0–10.5)

## 2014-06-14 LAB — BASIC METABOLIC PANEL
Anion gap: 14 (ref 5–15)
BUN: 90 mg/dL — AB (ref 6–23)
CHLORIDE: 79 mmol/L — AB (ref 96–112)
CO2: 24 mmol/L (ref 19–32)
Calcium: 8.5 mg/dL (ref 8.4–10.5)
Creatinine, Ser: 5.57 mg/dL — ABNORMAL HIGH (ref 0.50–1.35)
GFR calc Af Amer: 11 mL/min — ABNORMAL LOW (ref >90)
GFR calc non Af Amer: 9 mL/min — ABNORMAL LOW (ref >90)
Glucose, Bld: 256 mg/dL — ABNORMAL HIGH (ref 70–99)
POTASSIUM: 4.2 mmol/L (ref 3.5–5.1)
Sodium: 117 mmol/L — CL (ref 135–145)

## 2014-06-14 LAB — CARBOXYHEMOGLOBIN
Carboxyhemoglobin: 1.7 % — ABNORMAL HIGH (ref 0.5–1.5)
Methemoglobin: 1.4 % (ref 0.0–1.5)
O2 SAT: 72.7 %
TOTAL HEMOGLOBIN: 8.3 g/dL — AB (ref 13.5–18.0)

## 2014-06-14 LAB — IRON AND TIBC
Iron: 153 ug/dL — ABNORMAL HIGH (ref 42–165)
Saturation Ratios: 33 % (ref 20–55)
TIBC: 468 ug/dL — ABNORMAL HIGH (ref 215–435)
UIBC: 315 ug/dL (ref 125–400)

## 2014-06-14 LAB — FERRITIN: Ferritin: 944 ng/mL — ABNORMAL HIGH (ref 22–322)

## 2014-06-14 LAB — PROTIME-INR
INR: 1.62 — ABNORMAL HIGH (ref 0.00–1.49)
PROTHROMBIN TIME: 19.4 s — AB (ref 11.6–15.2)

## 2014-06-14 MED ORDER — FENTANYL CITRATE 0.05 MG/ML IJ SOLN
12.5000 ug | INTRAMUSCULAR | Status: DC | PRN
  Administered 2014-06-14 – 2014-06-17 (×5): 12.5 ug via INTRAVENOUS
  Filled 2014-06-14 (×5): qty 2

## 2014-06-14 MED ORDER — PROMETHAZINE HCL 25 MG/ML IJ SOLN
12.5000 mg | Freq: Four times a day (QID) | INTRAMUSCULAR | Status: DC | PRN
  Administered 2014-06-14 – 2014-06-17 (×4): 12.5 mg via INTRAVENOUS
  Filled 2014-06-14 (×4): qty 1

## 2014-06-14 MED ORDER — ONDANSETRON HCL 4 MG/2ML IJ SOLN
4.0000 mg | Freq: Four times a day (QID) | INTRAMUSCULAR | Status: DC
  Administered 2014-06-14 – 2014-06-16 (×8): 4 mg via INTRAVENOUS
  Filled 2014-06-14 (×8): qty 2

## 2014-06-14 MED ORDER — WARFARIN SODIUM 7.5 MG PO TABS
7.5000 mg | ORAL_TABLET | Freq: Once | ORAL | Status: AC
  Administered 2014-06-14: 7.5 mg via ORAL
  Filled 2014-06-14: qty 1

## 2014-06-14 NOTE — Progress Notes (Signed)
Patient ID: Luke Pittman, male   DOB: 1945/05/10, 69 y.o.   MRN: [id number]   SUBJECTIVE:   RHC 3/11 with improved hemodynamics. Milrinone gtt now at 0.5. Lasix and tolvaptan stopped 3/12 due to rising creatinine.  Creatinine up to 5.5 today with Na 117 and CVP 26.  Co-ox 73%.  Some nausea, poor appetite.  No urine output.   ECHO EF 20% mild LVH  RV mod-severely dilated with moderate-severe systolic dysfunction .    RHC 3/11 (on milrinone) RA mean 14 RV 53/15 PA 55/21, mean 34 PCWP mean 25 Oxygen saturations: PA 61% AO 95% Cardiac Output (Fick) 6.07  Cardiac Index (Fick) 3.19 PVR 1.5 WU  Scheduled Meds: . acetic acid-hydrocortisone  4 drop Right Ear QHS  . amiodarone  400 mg Oral BID  . aspirin  81 mg Oral Daily  . darbepoetin (ARANESP) injection - NON-DIALYSIS  100 mcg Subcutaneous Q Tue-1800  . ezetimibe  10 mg Oral Daily  . fentaNYL  12.5 mcg Intravenous Once  . fluticasone  2 spray Each Nare QHS  . furosemide  80 mg Intravenous Q6H  . insulin aspart  0-15 Units Subcutaneous TID WC  . insulin aspart  0-5 Units Subcutaneous QHS  . insulin detemir  10 Units Subcutaneous QHS  . lactulose  20 g Oral Daily  . Mega Benfotiamine Antioxidant Capsule  1 capsule Oral Daily  . nateglinide  60 mg Oral TID WC  . pantoprazole  40 mg Oral Q1200  . pravastatin  80 mg Oral q1800  . silver sulfADIAZINE   Topical BID  . sodium chloride  3 mL Intravenous Q12H  . Warfarin - Pharmacist Dosing Inpatient   Does not apply q1800   Continuous Infusions: . milrinone 0.5 mcg/kg/min (06/14/14 0936)   PRN Meds:.sodium chloride, acetaminophen, HYDROcodone-acetaminophen, MUSCLE RUB, ondansetron (ZOFRAN) IV, promethazine, sodium chloride, sodium chloride   Filed Vitals:   06/14/14 0343 06/14/14 0405 06/14/14 0541 06/14/14 0720  BP: 103/59  101/54 98/54  Pulse:      Temp: 97.4 F (36.3 C)   97.4 F (36.3 C)  TempSrc: Oral   Oral  Resp:      Height:      Weight:  163 lb 12.8 oz (74.3 kg)     SpO2: 96%   95%    Intake/Output Summary (Last 24 hours) at 06/14/14 0956 Last data filed at 06/14/14 0700  Gross per 24 hour  Intake  443.8 ml  Output      0 ml  Net  443.8 ml    LABS: Basic Metabolic Panel:  Recent Labs  56/21/30 0355 06/14/14 0345  NA 117* 117*  K 4.0 4.2  CL 78* 79*  CO2 27 24  GLUCOSE 258* 256*  BUN 81* 90*  CREATININE 4.55* 5.57*  CALCIUM 8.5 8.5   Liver Function Tests:  Recent Labs  06/12/14 0416  AST 22  ALT 26  ALKPHOS 95  BILITOT 1.8*  PROT 6.2  ALBUMIN 3.2*   No results for input(s): LIPASE, AMYLASE in the last 72 hours. CBC:  Recent Labs  06/13/14 0355 06/14/14 0345  WBC 7.4 7.3  HGB 8.8* 8.1*  HCT 25.3* 24.2*  MCV 69.9* 69.7*  PLT 97* 142*   Cardiac Enzymes: No results for input(s): CKTOTAL, CKMB, CKMBINDEX, TROPONINI in the last 72 hours. BNP: Invalid input(s): POCBNP D-Dimer: No results for input(s): DDIMER in the last 72 hours. Hemoglobin A1C: No results for input(s): HGBA1C in the last 72 hours. Fasting Lipid Panel:  No results for input(s): CHOL, HDL, LDLCALC, TRIG, CHOLHDL, LDLDIRECT in the last 72 hours. Thyroid Function Tests: No results for input(s): TSH, T4TOTAL, T3FREE, THYROIDAB in the last 72 hours.  Invalid input(s): FREET3 Anemia Panel: No results for input(s): VITAMINB12, FOLATE, FERRITIN, TIBC, IRON, RETICCTPCT in the last 72 hours.  RADIOLOGY: Ct Abdomen Pelvis Wo Contrast  06/05/2014   CLINICAL DATA:  69 year old with cardiomyopathy. Preop evaluation prior to placement of left ventricular assist device.  EXAM: CT ABDOMEN AND PELVIS WITHOUT CONTRAST  TECHNIQUE: Multidetector CT imaging of the abdomen and pelvis was performed following the standard protocol without IV contrast.  COMPARISON:  Chest CT 06/04/2014  FINDINGS: Again noted is a small left pleural effusion. There is cardiomegaly without significant pericardial fluid. No evidence for free intraperitoneal air.  Unenhanced CT was performed per  clinician order. Lack of IV contrast limits sensitivity and specificity, especially for evaluation of abdominal/pelvic solid viscera.  There is subcutaneous edema. There is a moderate amount of ascites around the liver and within the pelvis. Small amount of ascites around the spleen. No gross abnormality to the liver, gallbladder or spleen. Spleen size is normal. Normal appearance of the pancreas, stomach and adrenal glands. Normal appearance of the kidneys, urinary bladder and prostate.  Atherosclerotic calcifications in the abdominal aorta without aneurysm. No significant lymphadenopathy involving the abdomen or pelvis. Normal appearance of the small bowel, large bowel and appendix. Mild disc space loss at L5-S1. Mild facet arthropathy in the lower lumbar spine.  IMPRESSION: Small-to-moderate amount of ascites. Subcutaneous edema and a small left pleural effusion.   Electronically Signed   By: Richarda OverlieAdam  Henn M.D.   On: 06/05/2014 12:42   Dg Orthopantogram  06/05/2014   CLINICAL DATA:  Preoperative evaluation  EXAM: ORTHOPANTOGRAM/PANORAMIC  COMPARISON:  None  FINDINGS: Dental appliances present.  Multiple prior dental RIGHT extractions with dental amalgam at multiple remaining teeth.  No mandibular fracture or bone destruction identified.  IMPRESSION: No acute mandibular abnormalities.   Electronically Signed   By: Ulyses SouthwardMark  Boles M.D.   On: 06/05/2014 12:10   Dg Chest 2 View  05/08/2014   CLINICAL DATA:  Congestive failure  EXAM: CHEST  2 VIEW  COMPARISON:  10/03/2013  FINDINGS: Cardiac shadow remains enlarged. Postsurgical changes are again seen. No vascular congestion is seen. Very minimal effusions are noted bilaterally. No focal infiltrate is noted. No bony abnormality is seen.  IMPRESSION: Small effusions.  No other significant abnormality is noted.   Electronically Signed   By: Alcide CleverMark  Lukens M.D.   On: 05/06/2014 13:44   Ct Chest Wo Contrast  06/04/2014   CLINICAL DATA:  Shortness of breath, history of heart  failure  EXAM: CT CHEST WITHOUT CONTRAST  TECHNIQUE: Multidetector CT imaging of the chest was performed following the standard protocol without IV contrast.  COMPARISON:  05/31/2014  FINDINGS: Mild peribronchial wall thickening. Mild interstitial change with a few Kerley B-lines bilaterally. No alveolar opacification. Small bilateral pleural effusions larger on the left. No significant pericardial effusion.  Severe cardiac enlargement and coronary artery calcification. Mild thoracic aortic calcification.  Right PICC line tip extends to the cavoatrial junction.  Images through the upper abdomen show a small volume of ascites.  No acute musculoskeletal findings. No significant hilar or mediastinal adenopathy.  IMPRESSION: Findings consistent with congestive heart failure with mild interstitial pulmonary edema.   Electronically Signed   By: Esperanza Heiraymond  Rubner M.D.   On: 06/04/2014 16:08   [place] Abdomen Complete  05/24/2014  CLINICAL DATA:  Increased liver function test. History of diabetes, chronic kidney disease and patient is taking Cymbalta  EXAM: ULTRASOUND ABDOMEN COMPLETE  COMPARISON:  None.  FINDINGS: Gallbladder: Cholelithiasis without pericholecystic fluid. Gallbladder wall wall thickening measuring 5.6 mm. Negative sonographic Murphy sign.  Common bile duct: Diameter: 2.5 mm  Liver: No focal lesion identified. Within normal limits in parenchymal echogenicity.  IVC: No abnormality visualized.  Pancreas: Limited visualization secondary to overlying bowel gas.  Spleen: Size and appearance within normal limits.  Right Kidney: Length: 11.5 cm. Echogenicity within normal limits. No mass or hydronephrosis visualized.  Left Kidney: Length: 11.6 cm. Echogenicity within normal limits. No mass or hydronephrosis visualized.  Abdominal aorta: No aneurysm visualized.  Other findings: There is a small amount of ascites. There are bilateral pleural effusions.  IMPRESSION: 1. Cholelithiasis. Gallbladder wall thickening which  may be secondary to intrinsic gallbladder wall abnormality suggest cholecystitis, but can also be seen in the setting of ascites and hepatocellular disease versus hypoalbuminemia versus fluid overload. The lateral etiologies are favored over cholecystitis. 2. Small amount of ascites. 3. Bilateral pleural effusions.   Electronically Signed   By: Elige Ko   On: 2014/06/05 14:56   US Renal  06/13/2014   CLINICAL DATA:  Acute kidney injury.  EXAM: RENAL/URINARY TRACT ULTRASOUND COMPLETE  COMPARISON:  06/05/2014 and on ultrasound 06-05-2014  FINDINGS: Right Kidney:  Length: 13.7 cm. Echogenicity within normal limits. No mass or hydronephrosis visualized.  Left Kidney:  Length: 13.6 cm. Echogenicity within normal limits. No mass or hydronephrosis visualized.  Bladder:  Small amount of fluid in the bladder.  Small amount of fluid around the spleen. Ascites in the right lower quadrant.  IMPRESSION: Normal appearance of both kidneys without hydronephrosis.  Ascites.   Electronically Signed   By: Richarda Overlie M.D.   On: 06/13/2014 22:40   Dg Chest Port 1 View  05/31/2014   CLINICAL DATA:  PICC line placement  EXAM: PORTABLE CHEST - 1 VIEW  COMPARISON:  06-05-14  FINDINGS: Right-sided PICC line with the tip projecting over the cavoatrial junction. No focal consolidation or pneumothorax. Trace left pleural effusion. There is stable cardiomegaly. There is evidence of prior CABG.  Mild osteoarthritis of bilateral glenohumeral joints.  IMPRESSION: Right-sided PICC line with the tip projecting over the cavoatrial junction.   Electronically Signed   By: Elige Ko   On: 05/31/2014 16:43    PHYSICAL EXAM CVP 20 General: NAD Sitting on the side of the bed.  Neck: JVP 16, no thyromegaly or thyroid nodule.  Lungs: Decreased breath sounds at bases.  CV: Nondisplaced PMI.  Heart mildly tachy, regular S1/S2, no S3/S4, no murmur.  1+ edema 1/2 up lower legs bilaterally.  Abdomen: Soft, nontender, no hepatosplenomegaly,  mildly distended  Psych: Normal affect. Extremities: No clubbing or cyanosis.    TELEMETRY: Reviewed telemetry pt in atrial flutter in 90s.   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  1. Acute on chronic systolic CHF: EF 16% with at least moderate RV dysfunction by echo. He does not have an ICD (has not wanted in past).  He presented with NYHA class IIIb symptoms (dyspnea and profound fatigue), poor appetite, and markedly elevated transaminases. He remains volume overloaded on exam. BP is stable. RHC showed elevated filling pressures and low output.  On initial RHC, RA/PCWP = 0.75.  Milrinone gtt was begun 3/2 and he felt much better/more alert.  Milrinone turned down  to 0.125 on 3/3 with fall in co-ox.  Milrinone back up to 0.25, co-ox 61% with CVP 22.  3/6 milrinone was increased to 0.375 mcg then up to 0.5 on 3/10 due to ongoing low output.  RHC repeated on 3/10, RA/PWCP better at 0.56 and good CI but filling pressures still high.  Now, creatinine rising and up to 5.5.  Co-ox 73% today.  On Lasix per renal currently with no effect (basically anuric). He remains volume overloaded with weight coming up and CVP 26.  - Continue milrinone at 0.5.   - No beta blocker other than amiodarone given low output.   - Off lisinopril with hyperkalemia/elevated creatinine.    - Diuretics stopped due to rising creatinine 3/12, nephrology started Lasix 80 mg IV every 6 hours yesterday, no effect.  - Poor prognosis at this point.  He will need dialysis if we continue aggressive treatment but I think he would be unlikely to tolerate this well at all.  2. AKI: Cardiorenal syndrome with suspected superimposed ATN in setting of low output.  Cardiac output good now by co-ox and CVP remains high.  Creatinine worse today. No urine output.  As above, will need dialysis if continue aggressive treatment. 3. CAD: S/p CABG. Cath in 2013 with patent grafts. No  chest pain. Continue ASA, LFTs back to normal so restarted statin.   4. Elevated transaminases: Suspect hepatic congestion from CHF.Abdominal US showed normal liver echotexture. 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  His LFTs have come back to normal.  5. Diabetes: added nateglinide per diabetes coordinator.  Not good candidate for metformin in the future with CKD/advancedHF.  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  7. Atrial fibrillation: Went into atrial fibrillation => flutter this admission on milrinone.  - Started warfarin given elevated creatinine (rather than NOAC).  Stop heparin gtt with nosebleed.  If he goes hospice route, will stop warfarin.  - Now on po amiodarone.   8. Hyponatremia: Fluid restrict, decrease to 1000 cc. He got tolvaptan initially but this was held with rising creatinine. He does not seem markedly symptomatic from the low sodium, likely given chronicity.   9. Disposition: Very poor prognosis, would be poor dialysis candidate.  Hospice would be reasonable.  Palliative care following and they are thinking about this.   Marca Ancona  MD 06/14/2014 9:56 AM

## 2014-06-14 NOTE — Progress Notes (Signed)
   06/14/14 1300  Clinical Encounter Type  Visited With Patient and family together  Visit Type Initial;Spiritual support;Social support   Chaplain was asked to come by patient's room because the patient had an advanced directive that needed to be completed. Chaplain facilitated the signing and completion of the document. Patient and patient's family have copies of the advanced directive and chaplain placed a copy in patient's chart.  Geovannie Vilar, Tommi EmeryBlake R, Chaplain  1:34 PM

## 2014-06-14 NOTE — Progress Notes (Signed)
Pt sodium level this morning was 117, Creatinine 5.5 and pt had 0mL output last nigt . Cardiology PA notified. No further orders placed. Will continue to monitor pt.

## 2014-06-14 NOTE — Progress Notes (Signed)
Pt Heparin restarted at 1350 units. Pharmcey notified.

## 2014-06-14 NOTE — Progress Notes (Signed)
Progress Note from the Palliative Medicine Team at [facility]  Subjective: Mr. Rudden and his wife at bedside. Much discussed today regarding worsening renal function. Short term dialysis being offered with the understanding this will be of limited benefit and not highly recommended. With this new information Mr. Donado will be made DNR as per our previous discussions. We discussed home with hospice as well as hospice facility with goal of comfort if he decides not to do dialysis. He will think about these options today. I feel he is less inclined to do dialysis as he brought up hospice. He is also having a lot of nausea/vomiting - recommendations will be below.     Objective: Allergies  Allergen Reactions  . Beta Adrenergic Blockers Other (See Comments)    Swollen hands, Per patient currently ([id number]) tolerating Coreg.  . Fish Oil Diarrhea  . Januvia [Sitagliptin Phosphate] Other (See Comments)    Leg cramps  . Tetanus-Diphtheria Toxoids Td Swelling   Scheduled Meds: . acetic acid-hydrocortisone  4 drop Right Ear QHS  . amiodarone  400 mg Oral BID  . aspirin  81 mg Oral Daily  . darbepoetin (ARANESP) injection - NON-DIALYSIS  100 mcg Subcutaneous Q Tue-1800  . ezetimibe  10 mg Oral Daily  . fentaNYL  12.5 mcg Intravenous Once  . fluticasone  2 spray Each Nare QHS  . furosemide  80 mg Intravenous Q6H  . insulin aspart  0-15 Units Subcutaneous TID WC  . insulin aspart  0-5 Units Subcutaneous QHS  . insulin detemir  10 Units Subcutaneous QHS  . lactulose  20 g Oral Daily  . Mega Benfotiamine Antioxidant Capsule  1 capsule Oral Daily  . nateglinide  60 mg Oral TID WC  . ondansetron (ZOFRAN) IV  4 mg Intravenous 4 times per day  . pantoprazole  40 mg Oral Q1200  . pravastatin  80 mg Oral q1800  . silver sulfADIAZINE   Topical BID  . sodium chloride  3 mL Intravenous Q12H  . warfarin  7.5 mg Oral ONCE-1800  . Warfarin - Pharmacist Dosing Inpatient   Does not apply q1800   Continuous  Infusions: . milrinone 0.5 mcg/kg/min (06/14/14 0936)   PRN Meds:.sodium chloride, acetaminophen, fentaNYL, HYDROcodone-acetaminophen, MUSCLE RUB, promethazine, sodium chloride, sodium chloride  BP 98/54 mmHg  Pulse 90  Temp(Src) 97.4 F (36.3 C) (Oral)  Resp 18  Ht  (1.803 m)  Wt 74.3 kg (163 lb 12.8 oz)  BMI 22.86 kg/m2  SpO2 95%   PPS: 30%   Intake/Output Summary (Last 24 hours) at 06/14/14 1002 Last data filed at 06/14/14 0700  Gross per 24 hour  Intake  418.5 ml  Output      0 ml  Net  418.5 ml       Physical Exam:  General: Sitting up in bed, dry heaves and small amount spit up bloody mucous HEENT: [place]/AT, + JVD, moist mucous membranes Chest: No labored breathing, symmetric, room air CVS: Tachy Abdomen: Soft, NT, ND Ext: MAE, no edema, warm to touch Neuro: Awake, alert, oriented x 3  Labs: CBC    Component Value Date/Time   WBC 7.3 06/14/2014 0345   WBC 7.4 04/27/2014 0944   WBC 6.9 12/13/2013 0941   RBC 3.47* 06/14/2014 0345   RBC 3.93* 06/05/2014 0815   RBC 4.10* 04/27/2014 0944   RBC 4.5* 12/13/2013 0941   HGB 8.1* 06/14/2014 0345   HGB 11.1* 12/13/2013 0941   HCT 24.2* 06/14/2014 0345   HCT  35.4* 12/13/2013 0941   PLT 142* 06/14/2014 0345   MCV 69.7* 06/14/2014 0345   MCV 78.5* 12/13/2013 0941   MCH 23.3* 06/14/2014 0345   MCH 24.4* 04/27/2014 0944   MCH 24.6* 12/13/2013 0941   MCHC 33.5 06/14/2014 0345   MCHC 31.2* 04/27/2014 0944   MCHC 31.4* 12/13/2013 0941   RDW 18.4* 06/14/2014 0345   RDW 16.4* 04/27/2014 0944   LYMPHSABS 1.1 05/16/2014 1606   LYMPHSABS 1.5 04/27/2014 0944   MONOABS 0.6 05/28/2014 1606   EOSABS 0.1 05/10/2014 1606   EOSABS 0.2 04/27/2014 0944   BASOSABS 0.0 05/28/2014 1606   BASOSABS 0.0 04/27/2014 0944    BMET    Component Value Date/Time   NA 117* 06/14/2014 0345   NA 133* 05/24/2014 1245   K 4.2 06/14/2014 0345   CL 79* 06/14/2014 0345   CO2 24 06/14/2014 0345   GLUCOSE 256* 06/14/2014 0345    GLUCOSE 141* 05/24/2014 1245   BUN 90* 06/14/2014 0345   BUN 34* 05/24/2014 1245   CREATININE 5.57* 06/14/2014 0345   CREATININE 1.77* 04/11/2014 0830   CALCIUM 8.5 06/14/2014 0345   GFRNONAA 9* 06/14/2014 0345   GFRNONAA 64 10/20/2012 0852   GFRAA 11* 06/14/2014 0345   GFRAA 74 10/20/2012 0852    CMP     Component Value Date/Time   NA 117* 06/14/2014 0345   NA 133* 05/24/2014 1245   K 4.2 06/14/2014 0345   CL 79* 06/14/2014 0345   CO2 24 06/14/2014 0345   GLUCOSE 256* 06/14/2014 0345   GLUCOSE 141* 05/24/2014 1245   BUN 90* 06/14/2014 0345   BUN 34* 05/24/2014 1245   CREATININE 5.57* 06/14/2014 0345   CREATININE 1.77* 04/11/2014 0830   CALCIUM 8.5 06/14/2014 0345   PROT 6.2 06/12/2014 0416   PROT 5.9* 05/24/2014 1245   ALBUMIN 3.2* 06/12/2014 0416   AST 22 06/12/2014 0416   ALT 26 06/12/2014 0416   ALKPHOS 95 06/12/2014 0416   BILITOT 1.8* 06/12/2014 0416   BILITOT 2.5* 05/24/2014 1245   GFRNONAA 9* 06/14/2014 0345   GFRNONAA 64 10/20/2012 0852   GFRAA 11* 06/14/2014 0345   GFRAA 74 10/20/2012 0852    Assessment and Plan: 1. Code Status: DNR - discussed with pt and wife.  2. Symptom Control: 1. Neuropathy in feet: May consider capsaicin 0.075% QID. Continue Vicodin every 6 hours prn. I have ordered fentanyl 12.5 mg IV every 4 hours prn.  2. Weakness/fatigue: Continue management for heart failure team. Now complicated by renal failure. May be moving more to comfort care.  3. Nausea: r/t renal failure. Zofran 4 mg every 6 hours scheduled. Phenergan 12.5 mg every 6 hours prn.  3. Psycho/Social: Emotional support provided to patient and family at bedside during difficult conversation.  4. Disposition: To be decided.   Time In Time Out Total Time Spent with Patient Total Overall Time  0930 1000 20min 30min    Greater than 50%  of this time was spent counseling and coordinating care related to the above assessment and plan.   Yong ChannelAlicia Kerington Hildebrant, NP Palliative  Medicine Team Pager # [phone] (M-F 8a-5p) Team Phone # [phone] (Nights/Weekends)

## 2014-06-14 NOTE — Progress Notes (Addendum)
At 0110am 06/14/14 Pt was standing at sink when nose started bleeding. While reaching for his handkerchief he attempted to use IV pole for stabilization. When wheels moved pt eased himself to the  floor to avoid falling. Pt unharmed, V/S stable, CBG 240 and pt educated that his falls risk will have the to escalated for his safety. Cardiology MD on-call notified. Will continue to monitor pt.

## 2014-06-14 NOTE — Progress Notes (Signed)
Subjective: Interval History: has complaints N, hiccups.  Objective: Vital signs in last 24 hours: Temp:  [97.3 F (36.3 C)-97.6 F (36.4 C)] 97.4 F (36.3 C) (03/16 0720) Pulse Rate:  [87-91] 90 (03/16 0110) BP: (71-127)/(38-59) 98/54 mmHg (03/16 0720) SpO2:  [95 %-98 %] 95 % (03/16 0720) Weight:  [74.3 kg (163 lb 12.8 oz)] 74.3 kg (163 lb 12.8 oz) (03/16 0405) Weight change: 1.226 kg (2 lb 11.2 oz)  Intake/Output from previous day: 03/15 0701 - 03/16 0700 In: 494.4 [P.O.:120; I.V.:374.4] Out: -  Intake/Output this shift:    General appearance: alert, moderate distress and pale Resp: rales bibasilar Cardio: S1, S2 normal and systolic murmur: holosystolic 2/6, blowing at apex GI: pos bs, liver down 6 cm Extremities: edema 3+  Lab Results:  Recent Labs  06/13/14 0355 06/14/14 0345  WBC 7.4 7.3  HGB 8.8* 8.1*  HCT 25.3* 24.2*  PLT 97* 142*   BMET:  Recent Labs  06/13/14 0355 06/14/14 0345  NA 117* 117*  K 4.0 4.2  CL 78* 79*  CO2 27 24  GLUCOSE 258* 256*  BUN 81* 90*  CREATININE 4.55* 5.57*  CALCIUM 8.5 8.5   No results for input(s): PTH in the last 72 hours. Iron Studies: No results for input(s): IRON, TIBC, TRANSFERRIN, FERRITIN in the last 72 hours.  Studies/Results: [place] Renal  06/13/2014   CLINICAL DATA:  Acute kidney injury.  EXAM: RENAL/URINARY TRACT ULTRASOUND COMPLETE  COMPARISON:  06/05/2014 and on ultrasound 05/07/2014  FINDINGS: Right Kidney:  Length: 13.7 cm. Echogenicity within normal limits. No mass or hydronephrosis visualized.  Left Kidney:  Length: 13.6 cm. Echogenicity within normal limits. No mass or hydronephrosis visualized.  Bladder:  Small amount of fluid in the bladder.  Small amount of fluid around the spleen. Ascites in the right lower quadrant.  IMPRESSION: Normal appearance of both kidneys without hydronephrosis.  Ascites.   Electronically Signed   By: Richarda OverlieAdam  Henn M.D.   On: 06/13/2014 22:40    I have reviewed the patient's current  medications.  Assessment/Plan: 1  AKI/CKD  AKI hemodynamic and progressive.  Olguric , vol xs, low SNa.  Uremic.  Discussed with him and wife options.  At point , need decision, to intervene with RRT or not .  Can do short term but recovery small with very limited outcome regardless.  They will discuss with PC and cards also 2 CM 3 DM 4 Anemia 5 CAD 6 HOH P dicussions, if decides to intervene, CRRt, DM control.      LOS: 16 days   Luke Pittman L 06/14/2014,9:40 AM

## 2014-06-14 NOTE — Progress Notes (Signed)
ANTICOAGULATION CONSULT NOTE - Follow Up Consult  Pharmacy Consult for heparin + warfarin Indication: atrial fibrillation  Allergies  Allergen Reactions  . Beta Adrenergic Blockers Other (See Comments)    Swollen hands, Per patient currently (ZOX0960(Jul2015) tolerating Coreg.  . Fish Oil Diarrhea  . Januvia [Sitagliptin Phosphate] Other (See Comments)    Leg cramps  . Tetanus-Diphtheria Toxoids Td Swelling    Patient Measurements: Height: 5\' 11"  (180.3 cm) Weight: 163 lb 12.8 oz (74.3 kg) IBW/kg (Calculated) : 75.3 Heparin Dosing Weight: 70.7 kg  Vital Signs: Temp: 97.4 F (36.3 C) (03/16 0720) Temp Source: Oral (03/16 0720) BP: 98/54 mmHg (03/16 0720) Pulse Rate: 90 (03/16 0110)  Labs:  Recent Labs  06/12/14 0415  06/12/14 0416 06/13/14 0355 06/14/14 0345  HGB  --   < > 8.6* 8.8* 8.1*  HCT  --   --  25.5* 25.3* 24.2*  PLT  --   --  130* 97* 142*  LABPROT 16.9*  --   --  16.9* 19.4*  INR 1.36  --   --  1.36 1.62*  HEPARINUNFRC 0.58  --   --  0.63  --   CREATININE  --   --  3.50* 4.55* 5.57*  < > = values in this interval not displayed.  Estimated Creatinine Clearance: 13.3 mL/min (by C-G formula based on Cr of 5.57).   Medications:  Scheduled:  . acetic acid-hydrocortisone  4 drop Right Ear QHS  . amiodarone  400 mg Oral BID  . aspirin  81 mg Oral Daily  . darbepoetin (ARANESP) injection - NON-DIALYSIS  100 mcg Subcutaneous Q Tue-1800  . ezetimibe  10 mg Oral Daily  . fentaNYL  12.5 mcg Intravenous Once  . fluticasone  2 spray Each Nare QHS  . furosemide  80 mg Intravenous Q6H  . insulin aspart  0-15 Units Subcutaneous TID WC  . insulin aspart  0-5 Units Subcutaneous QHS  . insulin detemir  10 Units Subcutaneous QHS  . lactulose  20 g Oral Daily  . Mega Benfotiamine Antioxidant Capsule  1 capsule Oral Daily  . nateglinide  60 mg Oral TID WC  . pantoprazole  40 mg Oral Q1200  . pravastatin  80 mg Oral q1800  . silver sulfADIAZINE   Topical BID  . sodium  chloride  3 mL Intravenous Q12H  . Warfarin - Pharmacist Dosing Inpatient   Does not apply q1800   Infusions:  . milrinone 0.5 mcg/kg/min (06/14/14 [id number])    Assessment: 69 yo male admitted with CHF exacerbation and was undergoing LVAD workup but poor RV function and Cr rising.Pharmacy consulted to dose warfarin and heparin   PMH: CHF (ECHO 05/2014 LVEF 20%), CAD (s/p CABG x4 '94), ischemic cardiomyopathy, HLD, CKD stage III, anemia, DM, CVA  AC/heme: vte ppx, afib -new this admit . Warf held for procedures then resume warfarin 3/13 CBC low but stable > iron dextran given 3/11; persistent nose bleeds INR 1.62 New amio this admit  Goal: INR 2-3 Heparin level 0.3-0.7 units/ml Monitor platelets by anticoagulation protocol: Yes   Plan:  Discontinue heparin gtt with persistent nose bleeding -Warfarin 7.5mg  x1 today -Monitor daily HL, INR, CBC, s/sx of bleeding  Thank you for allowing pharmacy to be a part of this patients care team.  Lovenia KimJulie Allyn Bartelson Pharm.D., BCPS, AQ-Cardiology Clinical Pharmacist 06/14/2014 9:45 AM Pager: [phone] Phone: [phone]

## 2014-06-14 NOTE — Progress Notes (Signed)
Pt continues to have bloody noses and woozeing blood from old IV sites. Cardiology on-call notified and permission given to turn Heparin off. Pharmacy notified. Will continue to monitor pt. If bleeding subsides RN will turn Heparin back on.

## 2014-06-15 LAB — CBC
HCT: 23.7 % — ABNORMAL LOW (ref 39.0–52.0)
HEMOGLOBIN: 8 g/dL — AB (ref 13.0–17.0)
MCH: 23.7 pg — AB (ref 26.0–34.0)
MCHC: 33.8 g/dL (ref 30.0–36.0)
MCV: 70.3 fL — ABNORMAL LOW (ref 78.0–100.0)
Platelets: 119 10*3/uL — ABNORMAL LOW (ref 150–400)
RBC: 3.37 MIL/uL — AB (ref 4.22–5.81)
RDW: 19.4 % — AB (ref 11.5–15.5)
WBC: 7.1 10*3/uL (ref 4.0–10.5)

## 2014-06-15 LAB — COMPREHENSIVE METABOLIC PANEL
ALT: 19 U/L (ref 0–53)
AST: 23 U/L (ref 0–37)
Albumin: 3.2 g/dL — ABNORMAL LOW (ref 3.5–5.2)
Alkaline Phosphatase: 86 U/L (ref 39–117)
Anion gap: 14 (ref 5–15)
BILIRUBIN TOTAL: 1.6 mg/dL — AB (ref 0.3–1.2)
BUN: 94 mg/dL — AB (ref 6–23)
CALCIUM: 8.4 mg/dL (ref 8.4–10.5)
CO2: 22 mmol/L (ref 19–32)
Chloride: 80 mmol/L — ABNORMAL LOW (ref 96–112)
Creatinine, Ser: 6.6 mg/dL — ABNORMAL HIGH (ref 0.50–1.35)
GFR calc Af Amer: 9 mL/min — ABNORMAL LOW (ref >90)
GFR calc non Af Amer: 8 mL/min — ABNORMAL LOW (ref >90)
Glucose, Bld: 193 mg/dL — ABNORMAL HIGH (ref 70–99)
Potassium: 4.6 mmol/L (ref 3.5–5.1)
SODIUM: 116 mmol/L — AB (ref 135–145)
Total Protein: 6.1 g/dL (ref 6.0–8.3)

## 2014-06-15 LAB — PROTIME-INR
INR: 2.56 — ABNORMAL HIGH (ref 0.00–1.49)
PROTHROMBIN TIME: 27.7 s — AB (ref 11.6–15.2)

## 2014-06-15 LAB — PHOSPHORUS: Phosphorus: 5.2 mg/dL — ABNORMAL HIGH (ref 2.3–4.6)

## 2014-06-15 LAB — CARBOXYHEMOGLOBIN
Carboxyhemoglobin: 2.2 % — ABNORMAL HIGH (ref 0.5–1.5)
Methemoglobin: 1.2 % (ref 0.0–1.5)
O2 SAT: 66.9 %
TOTAL HEMOGLOBIN: 8.3 g/dL — AB (ref 13.5–18.0)

## 2014-06-15 LAB — GLUCOSE, CAPILLARY
GLUCOSE-CAPILLARY: 153 mg/dL — AB (ref 70–99)
GLUCOSE-CAPILLARY: 161 mg/dL — AB (ref 70–99)
GLUCOSE-CAPILLARY: 141 mg/dL — AB (ref 70–99)
Glucose-Capillary: 128 mg/dL — ABNORMAL HIGH (ref 70–99)

## 2014-06-15 MED ORDER — WARFARIN SODIUM 2 MG PO TABS
2.0000 mg | ORAL_TABLET | Freq: Once | ORAL | Status: DC
  Filled 2014-06-15: qty 1

## 2014-06-15 MED ORDER — PROCHLORPERAZINE EDISYLATE 5 MG/ML IJ SOLN
10.0000 mg | Freq: Four times a day (QID) | INTRAMUSCULAR | Status: DC | PRN
  Filled 2014-06-15 (×3): qty 2

## 2014-06-15 NOTE — Progress Notes (Signed)
Offered to walk with pt, he declined, stating "There's no point in that." Encouraged pt and wife, will check back tomorrow in case pt changes his mind.  Ethelda ChickKristan Maalle Starrett CES, ACSM 10:44 AM 06/15/2014

## 2014-06-15 NOTE — Progress Notes (Signed)
Patient ID: Luke Pittman, male   DOB: 02-21-1946, 69 y.o.   MRN: [id number]   SUBJECTIVE:   RHC 3/11 with improved hemodynamics. Milrinone gtt now at 0.5. Lasix and tolvaptan stopped 3/12 due to rising creatinine.  Creatinine up to 6.6 today with Na 116 and CVP 20.  Co-ox 66%.  Ongoing nausea, poor appetite.  Minimal UOP.   ECHO EF 20% mild LVH  RV mod-severely dilated with moderate-severe systolic dysfunction .    RHC 3/11 (on milrinone) RA mean 14 RV 53/15 PA 55/21, mean 34 PCWP mean 25 Oxygen saturations: PA 61% AO 95% Cardiac Output (Fick) 6.07  Cardiac Index (Fick) 3.19 PVR 1.5 WU  Scheduled Meds: . acetic acid-hydrocortisone  4 drop Right Ear QHS  . amiodarone  400 mg Oral BID  . aspirin  81 mg Oral Daily  . darbepoetin (ARANESP) injection - NON-DIALYSIS  100 mcg Subcutaneous Q Tue-1800  . ezetimibe  10 mg Oral Daily  . fentaNYL  12.5 mcg Intravenous Once  . fluticasone  2 spray Each Nare QHS  . furosemide  80 mg Intravenous Q6H  . insulin aspart  0-15 Units Subcutaneous TID WC  . insulin aspart  0-5 Units Subcutaneous QHS  . insulin detemir  10 Units Subcutaneous QHS  . lactulose  20 g Oral Daily  . Mega Benfotiamine Antioxidant Capsule  1 capsule Oral Daily  . nateglinide  60 mg Oral TID WC  . ondansetron (ZOFRAN) IV  4 mg Intravenous 4 times per day  . pantoprazole  40 mg Oral Q1200  . pravastatin  80 mg Oral q1800  . silver sulfADIAZINE   Topical BID  . sodium chloride  3 mL Intravenous Q12H  . Warfarin - Pharmacist Dosing Inpatient   Does not apply q1800   Continuous Infusions: . milrinone 0.5 mcg/kg/min (06/15/14 0331)   PRN Meds:.sodium chloride, acetaminophen, fentaNYL, HYDROcodone-acetaminophen, MUSCLE RUB, promethazine, sodium chloride, sodium chloride   Filed Vitals:   06/14/14 1900 06/14/14 2300 06/15/14 0300 06/15/14 0415  BP: 94/52 94/38 88/34    Pulse: 88     Temp: 97.4 F (36.3 C) 97.4 F (36.3 C) 97.5 F (36.4 C)   TempSrc: Oral Oral  Oral   Resp: 20 16 16    Height:      Weight:    165 lb 5.5 oz (75 kg)  SpO2: 98% 96% 96%     Intake/Output Summary (Last 24 hours) at 06/15/14 0729 Last data filed at 06/15/14 0600  Gross per 24 hour  Intake  631.4 ml  Output      0 ml  Net  631.4 ml    LABS: Basic Metabolic Panel:  Recent Labs  47/82/9503/16/16 0345 06/15/14 0337  NA 117* 116*  K 4.2 4.6  CL 79* 80*  CO2 24 22  GLUCOSE 256* 193*  BUN 90* 94*  CREATININE 5.57* 6.60*  CALCIUM 8.5 8.4  PHOS  --  5.2*   Liver Function Tests:  Recent Labs  06/15/14 0337  AST 23  ALT 19  ALKPHOS 86  BILITOT 1.6*  PROT 6.1  ALBUMIN 3.2*   No results for input(s): LIPASE, AMYLASE in the last 72 hours. CBC:  Recent Labs  06/14/14 0345 06/15/14 0337  WBC 7.3 7.1  HGB 8.1* 8.0*  HCT 24.2* 23.7*  MCV 69.7* 70.3*  PLT 142* 119*   Cardiac Enzymes: No results for input(s): CKTOTAL, CKMB, CKMBINDEX, TROPONINI in the last 72 hours. BNP: Invalid input(s): POCBNP D-Dimer: No results for input(s): DDIMER in  the last 72 hours. Hemoglobin A1C: No results for input(s): HGBA1C in the last 72 hours. Fasting Lipid Panel: No results for input(s): CHOL, HDL, LDLCALC, TRIG, CHOLHDL, LDLDIRECT in the last 72 hours. Thyroid Function Tests: No results for input(s): TSH, T4TOTAL, T3FREE, THYROIDAB in the last 72 hours.  Invalid input(s): FREET3 Anemia Panel:  Recent Labs  06/14/14 0345  FERRITIN 944*  TIBC 468*  IRON 153*    RADIOLOGY: Ct Abdomen Pelvis Wo Contrast  06/05/2014   CLINICAL DATA:  69 year old with cardiomyopathy. Preop evaluation prior to placement of left ventricular assist device.  EXAM: CT ABDOMEN AND PELVIS WITHOUT CONTRAST  TECHNIQUE: Multidetector CT imaging of the abdomen and pelvis was performed following the standard protocol without IV contrast.  COMPARISON:  Chest CT 06/04/2014  FINDINGS: Again noted is a small left pleural effusion. There is cardiomegaly without significant pericardial fluid. No  evidence for free intraperitoneal air.  Unenhanced CT was performed per clinician order. Lack of IV contrast limits sensitivity and specificity, especially for evaluation of abdominal/pelvic solid viscera.  There is subcutaneous edema. There is a moderate amount of ascites around the liver and within the pelvis. Small amount of ascites around the spleen. No gross abnormality to the liver, gallbladder or spleen. Spleen size is normal. Normal appearance of the pancreas, stomach and adrenal glands. Normal appearance of the kidneys, urinary bladder and prostate.  Atherosclerotic calcifications in the abdominal aorta without aneurysm. No significant lymphadenopathy involving the abdomen or pelvis. Normal appearance of the small bowel, large bowel and appendix. Mild disc space loss at L5-S1. Mild facet arthropathy in the lower lumbar spine.  IMPRESSION: Small-to-moderate amount of ascites. Subcutaneous edema and a small left pleural effusion.   Electronically Signed   By: Richarda Overlie M.D.   On: 06/05/2014 12:42   Dg Orthopantogram  06/05/2014   CLINICAL DATA:  Preoperative evaluation  EXAM: ORTHOPANTOGRAM/PANORAMIC  COMPARISON:  None  FINDINGS: Dental appliances present.  Multiple prior dental RIGHT extractions with dental amalgam at multiple remaining teeth.  No mandibular fracture or bone destruction identified.  IMPRESSION: No acute mandibular abnormalities.   Electronically Signed   By: Ulyses Southward M.D.   On: 06/05/2014 12:10   Dg Chest 2 View  05/03/2014   CLINICAL DATA:  Congestive failure  EXAM: CHEST  2 VIEW  COMPARISON:  10/03/2013  FINDINGS: Cardiac shadow remains enlarged. Postsurgical changes are again seen. No vascular congestion is seen. Very minimal effusions are noted bilaterally. No focal infiltrate is noted. No bony abnormality is seen.  IMPRESSION: Small effusions.  No other significant abnormality is noted.   Electronically Signed   By: Alcide Clever M.D.   On: 05/21/2014 13:44   Ct Chest Wo  Contrast  06/04/2014   CLINICAL DATA:  Shortness of breath, history of heart failure  EXAM: CT CHEST WITHOUT CONTRAST  TECHNIQUE: Multidetector CT imaging of the chest was performed following the standard protocol without IV contrast.  COMPARISON:  05/31/2014  FINDINGS: Mild peribronchial wall thickening. Mild interstitial change with a few Kerley B-lines bilaterally. No alveolar opacification. Small bilateral pleural effusions larger on the left. No significant pericardial effusion.  Severe cardiac enlargement and coronary artery calcification. Mild thoracic aortic calcification.  Right PICC line tip extends to the cavoatrial junction.  Images through the upper abdomen show a small volume of ascites.  No acute musculoskeletal findings. No significant hilar or mediastinal adenopathy.  IMPRESSION: Findings consistent with congestive heart failure with mild interstitial pulmonary edema.   Electronically Signed  By: Esperanza Heir M.D.   On: 06/04/2014 16:08   US Abdomen Complete  05/25/2014   CLINICAL DATA:  Increased liver function test. History of diabetes, chronic kidney disease and patient is taking Cymbalta  EXAM: ULTRASOUND ABDOMEN COMPLETE  COMPARISON:  None.  FINDINGS: Gallbladder: Cholelithiasis without pericholecystic fluid. Gallbladder wall wall thickening measuring 5.6 mm. Negative sonographic Murphy sign.  Common bile duct: Diameter: 2.5 mm  Liver: No focal lesion identified. Within normal limits in parenchymal echogenicity.  IVC: No abnormality visualized.  Pancreas: Limited visualization secondary to overlying bowel gas.  Spleen: Size and appearance within normal limits.  Right Kidney: Length: 11.5 cm. Echogenicity within normal limits. No mass or hydronephrosis visualized.  Left Kidney: Length: 11.6 cm. Echogenicity within normal limits. No mass or hydronephrosis visualized.  Abdominal aorta: No aneurysm visualized.  Other findings: There is a small amount of ascites. There are bilateral pleural  effusions.  IMPRESSION: 1. Cholelithiasis. Gallbladder wall thickening which may be secondary to intrinsic gallbladder wall abnormality suggest cholecystitis, but can also be seen in the setting of ascites and hepatocellular disease versus hypoalbuminemia versus fluid overload. The lateral etiologies are favored over cholecystitis. 2. Small amount of ascites. 3. Bilateral pleural effusions.   Electronically Signed   By: Elige Ko   On: 05/21/2014 14:56   US Renal  06/13/2014   CLINICAL DATA:  Acute kidney injury.  EXAM: RENAL/URINARY TRACT ULTRASOUND COMPLETE  COMPARISON:  06/05/2014 and on ultrasound 05/10/2014  FINDINGS: Right Kidney:  Length: 13.7 cm. Echogenicity within normal limits. No mass or hydronephrosis visualized.  Left Kidney:  Length: 13.6 cm. Echogenicity within normal limits. No mass or hydronephrosis visualized.  Bladder:  Small amount of fluid in the bladder.  Small amount of fluid around the spleen. Ascites in the right lower quadrant.  IMPRESSION: Normal appearance of both kidneys without hydronephrosis.  Ascites.   Electronically Signed   By: Richarda Overlie M.D.   On: 06/13/2014 22:40   Dg Chest Port 1 View  05/31/2014   CLINICAL DATA:  PICC line placement  EXAM: PORTABLE CHEST - 1 VIEW  COMPARISON:  05/16/2014  FINDINGS: Right-sided PICC line with the tip projecting over the cavoatrial junction. No focal consolidation or pneumothorax. Trace left pleural effusion. There is stable cardiomegaly. There is evidence of prior CABG.  Mild osteoarthritis of bilateral glenohumeral joints.  IMPRESSION: Right-sided PICC line with the tip projecting over the cavoatrial junction.   Electronically Signed   By: Elige Ko   On: 05/31/2014 16:43    PHYSICAL EXAM CVP 20 General: NAD Sitting on the side of the bed.  Neck: JVP 18, no thyromegaly or thyroid nodule.  Lungs: Decreased breath sounds at bases.  CV: Nondisplaced PMI.  Heart mildly tachy, regular S1/S2, no S3/S4, no murmur.  1+ edema 1/2 up  lower legs bilaterally.  Abdomen: Soft, nontender, no hepatosplenomegaly, mildly distended  Psych: Normal affect. Extremities: No clubbing or cyanosis.    TELEMETRY: Reviewed telemetry pt in atrial flutter in 90s.   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  1. Acute on chronic systolic CHF: EF 69% with at least moderate RV dysfunction by echo. He does not have an ICD (has not wanted in past).  He presented with NYHA class IIIb symptoms (dyspnea and profound fatigue), poor appetite, and markedly elevated transaminases. He remains volume overloaded on exam. BP is stable. RHC showed elevated filling pressures and low output.  On initial RHC,  RA/PCWP = 0.75.  Milrinone gtt was begun 3/2 and he felt much better/more alert.  Milrinone turned down to 0.125 on 3/3 with fall in co-ox.  Milrinone back up to 0.25, co-ox 61% with CVP 22.  3/6 milrinone was increased to 0.375 mcg then up to 0.5 on 3/10 due to ongoing low output.  RHC repeated on 3/10, RA/PWCP better at 0.56 and good CI but filling pressures still high.  Now, creatinine rising and up to 6.6.  Co-ox 66% today.  On Lasix per renal currently with no effect (basically anuric). He remains volume overloaded with weight coming up and CVP 20. He has decided on hospice/comfort care at this point.  - When plans made for hospice can titrate milrinone to off.   2. AKI: Cardiorenal syndrome with suspected superimposed ATN in setting of low output.  Cardiac output good now by co-ox and CVP remains high.  Creatinine worse today. Minimal urine output.  Patient does not want dialysis, which I think is a reasonable decision. 3. CAD: S/p CABG. Cath in 2013 with patent grafts. No chest pain. Continue ASA, LFTs back to normal so restarted statin.   4. Elevated transaminases: Suspect hepatic congestion from CHF.Abdominal US showed normal liver echotexture. 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  His LFTs have come back to normal.  5. Diabetes: added nateglinide per diabetes coordinator.  Not good candidate for metformin in the future with CKD/advancedHF.  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  7. Atrial fibrillation: Went into atrial fibrillation => flutter this admission on milrinone.  - Stop warfarin given hospice plans.    8. Hyponatremia: Fluid restrict, decrease to 1000 cc. He got tolvaptan initially but this was held with rising creatinine. He does not seem markedly symptomatic from the low sodium, likely given chronicity.   9. Disposition: Plan at this point for hospice/comfort measures.  He will be seen by palliative care, ?[facility].   Marca Ancona  MD 06/15/2014 7:29 AM

## 2014-06-15 NOTE — Progress Notes (Signed)
Subjective: Interval History: has complaints says thanks.  Objective: Vital signs in last 24 hours: Temp:  [97.2 F (36.2 C)-97.5 F (36.4 C)] 97.5 F (36.4 C) (03/17 0740) Pulse Rate:  [88] 88 (03/16 1900) Resp:  [16-20] 18 (03/17 0740) BP: (88-119)/(34-65) 88/34 mmHg (03/17 0300) SpO2:  [95 %-98 %] 95 % (03/17 0740) Weight:  [75 kg (165 lb 5.5 oz)] 75 kg (165 lb 5.5 oz) (03/17 0415) Weight change: 0.7 kg (1 lb 8.7 oz)  Intake/Output from previous day: 03/16 0701 - 03/17 0700 In: 631.4 [P.O.:360; I.V.:271.4] Out: -  Intake/Output this shift:    General appearance: appears comfortable Resp: rales bibasilar Cardio: regularly irregular rhythm and systolic murmur: holosystolic 2/6, blowing at apex GI: soft, non-tender; bowel sounds normal; no masses,  no organomegaly Extremities: edema 3+  Lab Results:  Recent Labs  06/14/14 0345 06/15/14 0337  WBC 7.3 7.1  HGB 8.1* 8.0*  HCT 24.2* 23.7*  PLT 142* 119*   BMET:  Recent Labs  06/14/14 0345 06/15/14 0337  NA 117* 116*  K 4.2 4.6  CL 79* 80*  CO2 24 22  GLUCOSE 256* 193*  BUN 90* 94*  CREATININE 5.57* 6.60*  CALCIUM 8.5 8.4   No results for input(s): PTH in the last 72 hours. Iron Studies:  Recent Labs  06/14/14 0345  IRON 153*  TIBC 468*  FERRITIN 944*    Studies/Results: [facility]  06/13/2014   CLINICAL DATA:  Acute kidney injury.  EXAM: RENAL/URINARY TRACT ULTRASOUND COMPLETE  COMPARISON:  06/05/2014 and on ultrasound 05/19/2014  FINDINGS: Right Kidney:  Length: 13.7 cm. Echogenicity within normal limits. No mass or hydronephrosis visualized.  Left Kidney:  Length: 13.6 cm. Echogenicity within normal limits. No mass or hydronephrosis visualized.  Bladder:  Small amount of fluid in the bladder.  Small amount of fluid around the spleen. Ascites in the right lower quadrant.  IMPRESSION: Normal appearance of both kidneys without hydronephrosis.  Ascites.   Electronically Signed   By: Richarda OverlieAdam  Henn M.D.   On:  06/13/2014 22:40    I have reviewed the patient's current medications.  Assessment/Plan: 1  AKI ischemic ATN, will support current decision.  And be available for support. Will see again at your request.    LOS: 17 days   Zyionna Pesce L 06/15/2014,7:56 AM

## 2014-06-15 NOTE — Progress Notes (Addendum)
ANTICOAGULATION CONSULT NOTE - Follow Up Consult  Pharmacy Consult for heparin + warfarin Indication: atrial fibrillation  Allergies  Allergen Reactions  . Beta Adrenergic Blockers Other (See Comments)    Swollen hands, Per patient currently (ZOX0960(Jul2015) tolerating Coreg.  . Fish Oil Diarrhea  . Januvia [Sitagliptin Phosphate] Other (See Comments)    Leg cramps  . Tetanus-Diphtheria Toxoids Td Swelling    Patient Measurements: Height: 5\' 11"  (180.3 cm) Weight: 165 lb 5.5 oz (75 kg) IBW/kg (Calculated) : 75.3 Heparin Dosing Weight: 70.7 kg  Vital Signs: Temp: 97.4 F (36.3 C) (03/17 1136) Temp Source: Oral (03/17 1136) BP: 88/34 mmHg (03/17 0300)  Labs:  Recent Labs  06/13/14 0355 06/14/14 0345 06/15/14 0337  HGB 8.8* 8.1* 8.0*  HCT 25.3* 24.2* 23.7*  PLT 97* 142* 119*  LABPROT 16.9* 19.4* 27.7*  INR 1.36 1.62* 2.56*  HEPARINUNFRC 0.63  --   --   CREATININE 4.55* 5.57* 6.60*    Estimated Creatinine Clearance: 11.4 mL/min (by C-G formula based on Cr of 6.6).   Medications:  Scheduled:  . acetic acid-hydrocortisone  4 drop Right Ear QHS  . amiodarone  400 mg Oral BID  . aspirin  81 mg Oral Daily  . darbepoetin (ARANESP) injection - NON-DIALYSIS  100 mcg Subcutaneous Q Tue-1800  . ezetimibe  10 mg Oral Daily  . fentaNYL  12.5 mcg Intravenous Once  . fluticasone  2 spray Each Nare QHS  . furosemide  80 mg Intravenous Q6H  . insulin aspart  0-15 Units Subcutaneous TID WC  . insulin aspart  0-5 Units Subcutaneous QHS  . insulin detemir  10 Units Subcutaneous QHS  . lactulose  20 g Oral Daily  . Mega Benfotiamine Antioxidant Capsule  1 capsule Oral Daily  . nateglinide  60 mg Oral TID WC  . ondansetron (ZOFRAN) IV  4 mg Intravenous 4 times per day  . pantoprazole  40 mg Oral Q1200  . pravastatin  80 mg Oral q1800  . silver sulfADIAZINE   Topical BID  . sodium chloride  3 mL Intravenous Q12H  . Warfarin - Pharmacist Dosing Inpatient   Does not apply q1800    Infusions:  . milrinone 0.5 mcg/kg/min (06/15/14 1158)    Assessment: 69 yo male admitted with CHF exacerbation and was undergoing LVAD workup but poor RV function and Cr rising.Pharmacy consulted to dose warfarin and heparin  PMH: CHF (ECHO 05/2014 LVEF 20%), CAD (s/p CABG x4 '94), ischemic cardiomyopathy, HLD, CKD stage III, anemia, DM, CVA  *3/16 move toward comfort, to DC home with hospice  AC/heme: vte ppx, afib -new this admit . Warf held for procedures then resume warfarin 3/13 CBC low but stable > iron dextran given 3/11, aranesp; persistent nose bleeds INR 2.56, large jump New amio this admit  Goal: INR 2-3 Monitor platelets by anticoagulation protocol: Yes   Plan:  -Warfarin 2 mg mg x1 today with large INR jump and declining organ function -Monitor daily  INR, CBC, s/sx of bleeding  Thank you for allowing pharmacy to be a part of this patients care team.  Lovenia KimJulie Samah Lapiana Pharm.D., BCPS, AQ-Cardiology Clinical Pharmacist 06/15/2014 1:04 PM Pager: [phone] Phone: [phone]   06/15/2014 1:19 PM Spoke to Dr Shirlee LatchMcLean re plan for hospice and agreed to DC warfarin. Protocol and orders discontinued.  Tyne Banta C

## 2014-06-15 NOTE — Progress Notes (Signed)
Progress Note from the Palliative Medicine Team at [facility]  Subjective: Luke Pittman is sleeping this morning but his wife is at bedside. She says that her daughter is on her way into [place] and should be here late. He is still having nausea and I will add prn compazine. We discussed consideration of home with hospice vs hospice facility and I explained that I will need to adjust medications is they are thinking they want to go home (currently receiving IV prn meds). I think once their daughter gets here we will have more of a plan. I will continue to follow and support.    Objective: Allergies  Allergen Reactions  . Beta Adrenergic Blockers Other (See Comments)    Swollen hands, Per patient currently ([id number]) tolerating Coreg.  . Fish Oil Diarrhea  . Januvia [Sitagliptin Phosphate] Other (See Comments)    Leg cramps  . Tetanus-Diphtheria Toxoids Td Swelling   Scheduled Meds: . acetic acid-hydrocortisone  4 drop Right Ear QHS  . amiodarone  400 mg Oral BID  . aspirin  81 mg Oral Daily  . darbepoetin (ARANESP) injection - NON-DIALYSIS  100 mcg Subcutaneous Q Tue-1800  . ezetimibe  10 mg Oral Daily  . fentaNYL  12.5 mcg Intravenous Once  . fluticasone  2 spray Each Nare QHS  . furosemide  80 mg Intravenous Q6H  . insulin aspart  0-15 Units Subcutaneous TID WC  . insulin aspart  0-5 Units Subcutaneous QHS  . insulin detemir  10 Units Subcutaneous QHS  . lactulose  20 g Oral Daily  . Mega Benfotiamine Antioxidant Capsule  1 capsule Oral Daily  . nateglinide  60 mg Oral TID WC  . ondansetron (ZOFRAN) IV  4 mg Intravenous 4 times per day  . pantoprazole  40 mg Oral Q1200  . pravastatin  80 mg Oral q1800  . silver sulfADIAZINE   Topical BID  . sodium chloride  3 mL Intravenous Q12H  . Warfarin - Pharmacist Dosing Inpatient   Does not apply q1800   Continuous Infusions: . milrinone 0.5 mcg/kg/min (06/15/14 0331)   PRN Meds:.sodium chloride, acetaminophen, fentaNYL,  HYDROcodone-acetaminophen, MUSCLE RUB, prochlorperazine, promethazine, sodium chloride, sodium chloride  BP 88/34 mmHg  Pulse 88  Temp(Src) 97.5 F (36.4 C) (Oral)  Resp 18  Ht 5\' 11"  (1.803 m)  Wt 75 kg (165 lb 5.5 oz)  BMI 23.07 kg/m2  SpO2 95%   PPS: 30%   Intake/Output Summary (Last 24 hours) at 06/15/14 1009 Last data filed at 06/15/14 0800  Gross per 24 hour  Intake  379.6 ml  Output      0 ml  Net  379.6 ml       Physical Exam:  General: Lying in bed, sleeping HEENT: [place]/AT, + JVD, moist mucous membranes Chest: No labored breathing, symmetric, room air CVS: Tachy Abdomen: Soft, NT, ND Ext: MAE, no edema, warm to touch Neuro: Did not awaken today, previously oriented x 3  Labs: CBC    Component Value Date/Time   WBC 7.1 06/15/2014 0337   WBC 7.4 04/27/2014 0944   WBC 6.9 12/13/2013 0941   RBC 3.37* 06/15/2014 0337   RBC 3.93* 06/05/2014 0815   RBC 4.10* 04/27/2014 0944   RBC 4.5* 12/13/2013 0941   HGB 8.0* 06/15/2014 0337   HGB 11.1* 12/13/2013 0941   HCT 23.7* 06/15/2014 0337   HCT 35.4* 12/13/2013 0941   PLT 119* 06/15/2014 0337   MCV 70.3* 06/15/2014 0337   MCV 78.5* 12/13/2013 0941  MCH 23.7* 06/15/2014 0337   MCH 24.4* 04/27/2014 0944   MCH 24.6* 12/13/2013 0941   MCHC 33.8 06/15/2014 0337   MCHC 31.2* 04/27/2014 0944   MCHC 31.4* 12/13/2013 0941   RDW 19.4* 06/15/2014 0337   RDW 16.4* 04/27/2014 0944   LYMPHSABS 1.1 June 04, 2014 1606   LYMPHSABS 1.5 04/27/2014 0944   MONOABS 0.6 2014-06-04 1606   EOSABS 0.1 06-04-14 1606   EOSABS 0.2 04/27/2014 0944   BASOSABS 0.0 06/04/14 1606   BASOSABS 0.0 04/27/2014 0944    BMET    Component Value Date/Time   NA 116* 06/15/2014 0337   NA 133* 05/24/2014 1245   K 4.6 06/15/2014 0337   CL 80* 06/15/2014 0337   CO2 22 06/15/2014 0337   GLUCOSE 193* 06/15/2014 0337   GLUCOSE 141* 05/24/2014 1245   BUN 94* 06/15/2014 0337   BUN 34* 05/24/2014 1245   CREATININE 6.60* 06/15/2014 0337    CREATININE 1.77* 04/11/2014 0830   CALCIUM 8.4 06/15/2014 0337   GFRNONAA 8* 06/15/2014 0337   GFRNONAA 64 10/20/2012 0852   GFRAA 9* 06/15/2014 0337   GFRAA 74 10/20/2012 0852    CMP     Component Value Date/Time   NA 116* 06/15/2014 0337   NA 133* 05/24/2014 1245   K 4.6 06/15/2014 0337   CL 80* 06/15/2014 0337   CO2 22 06/15/2014 0337   GLUCOSE 193* 06/15/2014 0337   GLUCOSE 141* 05/24/2014 1245   BUN 94* 06/15/2014 0337   BUN 34* 05/24/2014 1245   CREATININE 6.60* 06/15/2014 0337   CREATININE 1.77* 04/11/2014 0830   CALCIUM 8.4 06/15/2014 0337   PROT 6.1 06/15/2014 0337   PROT 5.9* 05/24/2014 1245   ALBUMIN 3.2* 06/15/2014 0337   AST 23 06/15/2014 0337   ALT 19 06/15/2014 0337   ALKPHOS 86 06/15/2014 0337   BILITOT 1.6* 06/15/2014 0337   BILITOT 2.5* 05/24/2014 1245   GFRNONAA 8* 06/15/2014 0337   GFRNONAA 64 10/20/2012 0852   GFRAA 9* 06/15/2014 0337   GFRAA 74 10/20/2012 0852    Assessment and Plan: 1. Code Status: DNR  2. Symptom Control: 1. Neuropathy in feet: May consider capsaicin 0.075% QID. Continue Vicodin every 6 hours prn. I have ordered fentanyl 12.5 mg IV every 4 hours prn.  2. Weakness/fatigue: Focus on more comfort care.  3. Nausea: r/t renal failure. Zofran 4 mg every 6 hours scheduled. Phenergan 12.5 mg every 6 hours prn. Compazine 10 mg every 6 hours prn.  3. Psycho/Social: Emotional support provided to patient and family at bedside during difficult conversation.  4. Disposition: To be decided.    Time In Time Out Total Time Spent with Patient Total Overall Time  0950 1010     Greater than 50%  of this time was spent counseling and coordinating care related to the above assessment and plan.  Yong Channel, NP Palliative Medicine Team Pager # [phone] (M-F 8a-5p) Team Phone # [phone] (Nights/Weekends)

## 2014-06-16 LAB — SODIUM: SODIUM: 117 mmol/L — AB (ref 135–145)

## 2014-06-16 LAB — GLUCOSE, CAPILLARY
GLUCOSE-CAPILLARY: 104 mg/dL — AB (ref 70–99)
Glucose-Capillary: 147 mg/dL — ABNORMAL HIGH (ref 70–99)
Glucose-Capillary: 97 mg/dL (ref 70–99)

## 2014-06-16 LAB — CBC
HEMATOCRIT: 23.3 % — AB (ref 39.0–52.0)
Hemoglobin: 8.1 g/dL — ABNORMAL LOW (ref 13.0–17.0)
MCH: 24 pg — AB (ref 26.0–34.0)
MCHC: 34.8 g/dL (ref 30.0–36.0)
MCV: 68.9 fL — ABNORMAL LOW (ref 78.0–100.0)
PLATELETS: 123 10*3/uL — AB (ref 150–400)
RBC: 3.38 MIL/uL — ABNORMAL LOW (ref 4.22–5.81)
RDW: 19.5 % — AB (ref 11.5–15.5)
WBC: 6.8 10*3/uL (ref 4.0–10.5)

## 2014-06-16 LAB — PROTIME-INR
INR: 3.95 — ABNORMAL HIGH (ref 0.00–1.49)
Prothrombin Time: 38.9 seconds — ABNORMAL HIGH (ref 11.6–15.2)

## 2014-06-16 MED ORDER — ONDANSETRON 8 MG/NS 50 ML IVPB
8.0000 mg | Freq: Four times a day (QID) | INTRAVENOUS | Status: DC
  Administered 2014-06-16 – 2014-06-17 (×4): 8 mg via INTRAVENOUS
  Filled 2014-06-16 (×6): qty 8

## 2014-06-16 MED ORDER — PROCHLORPERAZINE EDISYLATE 5 MG/ML IJ SOLN
10.0000 mg | Freq: Four times a day (QID) | INTRAMUSCULAR | Status: DC
  Administered 2014-06-16 – 2014-06-17 (×3): 10 mg via INTRAVENOUS
  Filled 2014-06-16 (×9): qty 2

## 2014-06-16 NOTE — Progress Notes (Addendum)
CRITICAL VALUE ALERT  Critical value received:  Sodium 117  Date of notification:  06/16/14  Time of notification:  0516  Critical value read back:Yes.    Nurse who received alert:  Ann MakiMegan Ashlen Kiger  MD notified (1st page):  Elink   Time of first page:  0517  Responding MD:  [id number]  Time MD responded:  Loyal JacobsonElink  Elink called RN at [phone] and asked that RN inform Cardiology of critical lab value. At 0535 RN notified MD Onalee HuaAlvarez of sodium critical lab value.

## 2014-06-16 NOTE — Progress Notes (Signed)
Progress Note from the Palliative Medicine Team at [facility]  Subjective: Luke Pittman is sleeping and he is still having nausea with dry heaving. He does jump up out of sleep and starts heaving. They have not utilized prn compazine. I will schedule compazine between zofran to attempt better control. He also has prn phenergan but may consider prn low dose haldol as well. They would like to go home with hospice if symptoms can get controlled (if nausea controlled will need to transition to po for home). His wife would prefer hospice facility if he declines and is not aware enough to go home.   Objective: Allergies  Allergen Reactions  . Beta Adrenergic Blockers Other (See Comments)    Swollen hands, Per patient currently ([id number]) tolerating Coreg.  . Fish Oil Diarrhea  . Januvia [Sitagliptin Phosphate] Other (See Comments)    Leg cramps  . Tetanus-Diphtheria Toxoids Td Swelling   Scheduled Meds: . acetic acid-hydrocortisone  4 drop Right Ear QHS  . amiodarone  400 mg Oral BID  . aspirin  81 mg Oral Daily  . darbepoetin (ARANESP) injection - NON-DIALYSIS  100 mcg Subcutaneous Q Tue-1800  . ezetimibe  10 mg Oral Daily  . fentaNYL  12.5 mcg Intravenous Once  . fluticasone  2 spray Each Nare QHS  . furosemide  80 mg Intravenous Q6H  . insulin aspart  0-15 Units Subcutaneous TID WC  . insulin aspart  0-5 Units Subcutaneous QHS  . insulin detemir  10 Units Subcutaneous QHS  . lactulose  20 g Oral Daily  . Mega Benfotiamine Antioxidant Capsule  1 capsule Oral Daily  . nateglinide  60 mg Oral TID WC  . ondansetron (ZOFRAN) IV  8 mg Intravenous 4 times per day  . pantoprazole  40 mg Oral Q1200  . pravastatin  80 mg Oral q1800  . prochlorperazine  10 mg Intravenous Q6H  . silver sulfADIAZINE   Topical BID  . sodium chloride  3 mL Intravenous Q12H   Continuous Infusions: . milrinone 0.5 mcg/kg/min (06/16/14 0456)   PRN Meds:.sodium chloride, acetaminophen, fentaNYL,  HYDROcodone-acetaminophen, MUSCLE RUB, promethazine, sodium chloride, sodium chloride  BP 97/51 mmHg  Pulse 81  Temp(Src) 97.2 F (36.2 C) (Oral)  Resp 14  Ht  (1.803 m)  Wt 77.9 kg (171 lb 11.8 oz)  BMI 23.96 kg/m2  SpO2 95%   PPS: 30% at best   Intake/Output Summary (Last 24 hours) at 06/16/14 1037 Last data filed at 06/16/14 0500  Gross per 24 hour  Intake  647.8 ml  Output      0 ml  Net  647.8 ml       Physical Exam:  General: Lying in bed, sleeping HEENT: [place]/AT, + JVD, moist mucous membranes Chest: No labored breathing, symmetric, room air CVS: Tachy Abdomen: Soft, NT, ND Ext: MAE, no edema, warm to touch Neuro: When awoke he was oriented x 3   Labs: CBC    Component Value Date/Time   WBC 6.8 06/16/2014 0428   WBC 7.4 04/27/2014 0944   WBC 6.9 12/13/2013 0941   RBC 3.38* 06/16/2014 0428   RBC 3.93* 06/05/2014 0815   RBC 4.10* 04/27/2014 0944   RBC 4.5* 12/13/2013 0941   HGB 8.1* 06/16/2014 0428   HGB 11.1* 12/13/2013 0941   HCT 23.3* 06/16/2014 0428   HCT 35.4* 12/13/2013 0941   PLT 123* 06/16/2014 0428   MCV 68.9* 06/16/2014 0428   MCV 78.5* 12/13/2013 0941   MCH 24.0* 06/16/2014 0865  MCH 24.4* 04/27/2014 0944   MCH 24.6* 12/13/2013 0941   MCHC 34.8 06/16/2014 0428   MCHC 31.2* 04/27/2014 0944   MCHC 31.4* 12/13/2013 0941   RDW 19.5* 06/16/2014 0428   RDW 16.4* 04/27/2014 0944   LYMPHSABS 1.1 05/20/2014 1606   LYMPHSABS 1.5 04/27/2014 0944   MONOABS 0.6 05/25/2014 1606   EOSABS 0.1 05/17/2014 1606   EOSABS 0.2 04/27/2014 0944   BASOSABS 0.0 05/02/2014 1606   BASOSABS 0.0 04/27/2014 0944    BMET    Component Value Date/Time   NA 117* 06/16/2014 0428   NA 133* 05/24/2014 1245   K 4.6 06/15/2014 0337   CL 80* 06/15/2014 0337   CO2 22 06/15/2014 0337   GLUCOSE 193* 06/15/2014 0337   GLUCOSE 141* 05/24/2014 1245   BUN 94* 06/15/2014 0337   BUN 34* 05/24/2014 1245   CREATININE 6.60* 06/15/2014 0337   CREATININE 1.77*  04/11/2014 0830   CALCIUM 8.4 06/15/2014 0337   GFRNONAA 8* 06/15/2014 0337   GFRNONAA 64 10/20/2012 0852   GFRAA 9* 06/15/2014 0337   GFRAA 74 10/20/2012 0852    CMP     Component Value Date/Time   NA 117* 06/16/2014 0428   NA 133* 05/24/2014 1245   K 4.6 06/15/2014 0337   CL 80* 06/15/2014 0337   CO2 22 06/15/2014 0337   GLUCOSE 193* 06/15/2014 0337   GLUCOSE 141* 05/24/2014 1245   BUN 94* 06/15/2014 0337   BUN 34* 05/24/2014 1245   CREATININE 6.60* 06/15/2014 0337   CREATININE 1.77* 04/11/2014 0830   CALCIUM 8.4 06/15/2014 0337   PROT 6.1 06/15/2014 0337   PROT 5.9* 05/24/2014 1245   ALBUMIN 3.2* 06/15/2014 0337   AST 23 06/15/2014 0337   ALT 19 06/15/2014 0337   ALKPHOS 86 06/15/2014 0337   BILITOT 1.6* 06/15/2014 0337   BILITOT 2.5* 05/24/2014 1245   GFRNONAA 8* 06/15/2014 0337   GFRNONAA 64 10/20/2012 0852   GFRAA 9* 06/15/2014 0337   GFRAA 74 10/20/2012 0852    Assessment and Plan: 1. Code Status: DNR  2. Symptom Control: 1. Neuropathy in feet: May consider capsaicin 0.075% QID. Continue Vicodin every 6 hours prn - this seems to be working well for him. I have ordered fentanyl 12.5 mg IV every 4 hours prn.  2. Weakness/fatigue: Focus on more comfort care.  3. Nausea: r/t renal failure. Zofran 4 mg every 6 hours scheduled. Compazine 10 mg every 6 hours between zofran doses. Phenergan 12.5 mg every 6 hours prn. Consider low dose haldol for nausea control.  3. Psycho/Social: Emotional support provided to patient and family at bedside during difficult situation.  4. Disposition: To be decided.    Time In Time Out Total Time Spent with Patient Total Overall Time  0930 0955 20min 25min    Greater than 50%  of this time was spent counseling and coordinating care related to the above assessment and plan.  Luke ChannelAlicia Joely Losier, NP Palliative Medicine Team Pager # [phone] (M-F 8a-5p) Team Phone # [phone] (Nights/Weekends)

## 2014-06-16 NOTE — Progress Notes (Signed)
Patient ID: Luke Pittman, male   DOB: Oct 15, 1945, 69 y.o.   MRN: [id number]   SUBJECTIVE:   RHC 3/11 with improved hemodynamics. Milrinone gtt now at 0.5. Lasix and tolvaptan stopped 3/12 due to rising creatinine.  Creatinine up to 6.6 yesterday with Na 116 and CVP 20.  Ongoing nausea, poor appetite.  Minimal UOP.  Patient and family have decided on hospice.   ECHO EF 20% mild LVH  RV mod-severely dilated with moderate-severe systolic dysfunction .    RHC 3/11 (on milrinone) RA mean 14 RV 53/15 PA 55/21, mean 34 PCWP mean 25 Oxygen saturations: PA 61% AO 95% Cardiac Output (Fick) 6.07  Cardiac Index (Fick) 3.19 PVR 1.5 WU  Scheduled Meds: . acetic acid-hydrocortisone  4 drop Right Ear QHS  . amiodarone  400 mg Oral BID  . aspirin  81 mg Oral Daily  . darbepoetin (ARANESP) injection - NON-DIALYSIS  100 mcg Subcutaneous Q Tue-1800  . ezetimibe  10 mg Oral Daily  . fentaNYL  12.5 mcg Intravenous Once  . fluticasone  2 spray Each Nare QHS  . furosemide  80 mg Intravenous Q6H  . insulin aspart  0-15 Units Subcutaneous TID WC  . insulin aspart  0-5 Units Subcutaneous QHS  . insulin detemir  10 Units Subcutaneous QHS  . lactulose  20 g Oral Daily  . Mega Benfotiamine Antioxidant Capsule  1 capsule Oral Daily  . nateglinide  60 mg Oral TID WC  . ondansetron (ZOFRAN) IV  8 mg Intravenous 4 times per day  . pantoprazole  40 mg Oral Q1200  . pravastatin  80 mg Oral q1800  . prochlorperazine  10 mg Intravenous Q6H  . silver sulfADIAZINE   Topical BID  . sodium chloride  3 mL Intravenous Q12H   Continuous Infusions: . milrinone 0.5 mcg/kg/min (06/16/14 0456)   PRN Meds:.sodium chloride, acetaminophen, fentaNYL, HYDROcodone-acetaminophen, MUSCLE RUB, promethazine, sodium chloride, sodium chloride   Filed Vitals:   06/16/14 0353 06/16/14 0400 06/16/14 0751 06/16/14 1203  BP:  90/37 97/51 101/49  Pulse:   81 84  Temp:  97.8 F (36.6 C) 97.2 F (36.2 C) 97.5 F (36.4 C)    TempSrc:  Oral Oral Oral  Resp:  14    Height:      Weight: 171 lb 11.8 oz (77.9 kg)     SpO2:  96% 95% 98%    Intake/Output Summary (Last 24 hours) at 06/16/14 1226 Last data filed at 06/16/14 0500  Gross per 24 hour  Intake  440.6 ml  Output      0 ml  Net  440.6 ml    LABS: Basic Metabolic Panel:  Recent Labs  46/96/2903/16/16 0345 06/15/14 0337 06/16/14 0428  NA 117* 116* 117*  K 4.2 4.6  --   CL 79* 80*  --   CO2 24 22  --   GLUCOSE 256* 193*  --   BUN 90* 94*  --   CREATININE 5.57* 6.60*  --   CALCIUM 8.5 8.4  --   PHOS  --  5.2*  --    Liver Function Tests:  Recent Labs  06/15/14 0337  AST 23  ALT 19  ALKPHOS 86  BILITOT 1.6*  PROT 6.1  ALBUMIN 3.2*   No results for input(s): LIPASE, AMYLASE in the last 72 hours. CBC:  Recent Labs  06/15/14 0337 06/16/14 0428  WBC 7.1 6.8  HGB 8.0* 8.1*  HCT 23.7* 23.3*  MCV 70.3* 68.9*  PLT 119* 123*  Cardiac Enzymes: No results for input(s): CKTOTAL, CKMB, CKMBINDEX, TROPONINI in the last 72 hours. BNP: Invalid input(s): POCBNP D-Dimer: No results for input(s): DDIMER in the last 72 hours. Hemoglobin A1C: No results for input(s): HGBA1C in the last 72 hours. Fasting Lipid Panel: No results for input(s): CHOL, HDL, LDLCALC, TRIG, CHOLHDL, LDLDIRECT in the last 72 hours. Thyroid Function Tests: No results for input(s): TSH, T4TOTAL, T3FREE, THYROIDAB in the last 72 hours.  Invalid input(s): FREET3 Anemia Panel:  Recent Labs  06/14/14 0345  FERRITIN 944*  TIBC 468*  IRON 153*    RADIOLOGY: Ct Abdomen Pelvis Wo Contrast  06/05/2014   CLINICAL DATA:  69 year old with cardiomyopathy. Preop evaluation prior to placement of left ventricular assist device.  EXAM: CT ABDOMEN AND PELVIS WITHOUT CONTRAST  TECHNIQUE: Multidetector CT imaging of the abdomen and pelvis was performed following the standard protocol without IV contrast.  COMPARISON:  Chest CT 06/04/2014  FINDINGS: Again noted is a small left  pleural effusion. There is cardiomegaly without significant pericardial fluid. No evidence for free intraperitoneal air.  Unenhanced CT was performed per clinician order. Lack of IV contrast limits sensitivity and specificity, especially for evaluation of abdominal/pelvic solid viscera.  There is subcutaneous edema. There is a moderate amount of ascites around the liver and within the pelvis. Small amount of ascites around the spleen. No gross abnormality to the liver, gallbladder or spleen. Spleen size is normal. Normal appearance of the pancreas, stomach and adrenal glands. Normal appearance of the kidneys, urinary bladder and prostate.  Atherosclerotic calcifications in the abdominal aorta without aneurysm. No significant lymphadenopathy involving the abdomen or pelvis. Normal appearance of the small bowel, large bowel and appendix. Mild disc space loss at L5-S1. Mild facet arthropathy in the lower lumbar spine.  IMPRESSION: Small-to-moderate amount of ascites. Subcutaneous edema and a small left pleural effusion.   Electronically Signed   By: Richarda Overlie M.D.   On: 06/05/2014 12:42   Dg Orthopantogram  06/05/2014   CLINICAL DATA:  Preoperative evaluation  EXAM: ORTHOPANTOGRAM/PANORAMIC  COMPARISON:  None  FINDINGS: Dental appliances present.  Multiple prior dental RIGHT extractions with dental amalgam at multiple remaining teeth.  No mandibular fracture or bone destruction identified.  IMPRESSION: No acute mandibular abnormalities.   Electronically Signed   By: Ulyses Southward M.D.   On: 06/05/2014 12:10   Dg Chest 2 View  05/21/2014   CLINICAL DATA:  Congestive failure  EXAM: CHEST  2 VIEW  COMPARISON:  10/03/2013  FINDINGS: Cardiac shadow remains enlarged. Postsurgical changes are again seen. No vascular congestion is seen. Very minimal effusions are noted bilaterally. No focal infiltrate is noted. No bony abnormality is seen.  IMPRESSION: Small effusions.  No other significant abnormality is noted.    Electronically Signed   By: Alcide Clever M.D.   On: 05/24/2014 13:44   Ct Chest Wo Contrast  06/04/2014   CLINICAL DATA:  Shortness of breath, history of heart failure  EXAM: CT CHEST WITHOUT CONTRAST  TECHNIQUE: Multidetector CT imaging of the chest was performed following the standard protocol without IV contrast.  COMPARISON:  05/31/2014  FINDINGS: Mild peribronchial wall thickening. Mild interstitial change with a few Kerley B-lines bilaterally. No alveolar opacification. Small bilateral pleural effusions larger on the left. No significant pericardial effusion.  Severe cardiac enlargement and coronary artery calcification. Mild thoracic aortic calcification.  Right PICC line tip extends to the cavoatrial junction.  Images through the upper abdomen show a small volume of ascites.  No  acute musculoskeletal findings. No significant hilar or mediastinal adenopathy.  IMPRESSION: Findings consistent with congestive heart failure with mild interstitial pulmonary edema.   Electronically Signed   By: Esperanza Heir M.D.   On: 06/04/2014 16:08   US Abdomen Complete  05/17/2014   CLINICAL DATA:  Increased liver function test. History of diabetes, chronic kidney disease and patient is taking Cymbalta  EXAM: ULTRASOUND ABDOMEN COMPLETE  COMPARISON:  None.  FINDINGS: Gallbladder: Cholelithiasis without pericholecystic fluid. Gallbladder wall wall thickening measuring 5.6 mm. Negative sonographic Murphy sign.  Common bile duct: Diameter: 2.5 mm  Liver: No focal lesion identified. Within normal limits in parenchymal echogenicity.  IVC: No abnormality visualized.  Pancreas: Limited visualization secondary to overlying bowel gas.  Spleen: Size and appearance within normal limits.  Right Kidney: Length: 11.5 cm. Echogenicity within normal limits. No mass or hydronephrosis visualized.  Left Kidney: Length: 11.6 cm. Echogenicity within normal limits. No mass or hydronephrosis visualized.  Abdominal aorta: No aneurysm  visualized.  Other findings: There is a small amount of ascites. There are bilateral pleural effusions.  IMPRESSION: 1. Cholelithiasis. Gallbladder wall thickening which may be secondary to intrinsic gallbladder wall abnormality suggest cholecystitis, but can also be seen in the setting of ascites and hepatocellular disease versus hypoalbuminemia versus fluid overload. The lateral etiologies are favored over cholecystitis. 2. Small amount of ascites. 3. Bilateral pleural effusions.   Electronically Signed   By: Elige Ko   On: 05/20/2014 14:56   US Renal  06/13/2014   CLINICAL DATA:  Acute kidney injury.  EXAM: RENAL/URINARY TRACT ULTRASOUND COMPLETE  COMPARISON:  06/05/2014 and on ultrasound 05/24/2014  FINDINGS: Right Kidney:  Length: 13.7 cm. Echogenicity within normal limits. No mass or hydronephrosis visualized.  Left Kidney:  Length: 13.6 cm. Echogenicity within normal limits. No mass or hydronephrosis visualized.  Bladder:  Small amount of fluid in the bladder.  Small amount of fluid around the spleen. Ascites in the right lower quadrant.  IMPRESSION: Normal appearance of both kidneys without hydronephrosis.  Ascites.   Electronically Signed   By: Richarda Overlie M.D.   On: 06/13/2014 22:40   Dg Chest Port 1 View  05/31/2014   CLINICAL DATA:  PICC line placement  EXAM: PORTABLE CHEST - 1 VIEW  COMPARISON:  05/06/2014  FINDINGS: Right-sided PICC line with the tip projecting over the cavoatrial junction. No focal consolidation or pneumothorax. Trace left pleural effusion. There is stable cardiomegaly. There is evidence of prior CABG.  Mild osteoarthritis of bilateral glenohumeral joints.  IMPRESSION: Right-sided PICC line with the tip projecting over the cavoatrial junction.   Electronically Signed   By: Elige Ko   On: 05/31/2014 16:43    PHYSICAL EXAM CVP 20 General: NAD, sleeping  Neck: JVP 18, no thyromegaly or thyroid nodule.  Lungs: Decreased breath sounds at bases.  CV: Nondisplaced PMI.   Heart mildly tachy, regular S1/S2, no S3/S4, no murmur.  1+ edema 1/2 up lower legs bilaterally.  Abdomen: Soft, nontender, no hepatosplenomegaly, mildly distended  Psych: Normal affect. Extremities: No clubbing or cyanosis.    TELEMETRY: Reviewed telemetry pt in atrial flutter in 90s.   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  1. Acute on chronic systolic CHF: EF 16% with at least moderate RV dysfunction by echo. He does not have an ICD (has not wanted in past).  He presented with NYHA class IIIb symptoms (dyspnea and profound fatigue), poor appetite, and markedly elevated  transaminases. He remains volume overloaded on exam. BP is stable. RHC showed elevated filling pressures and low output.  On initial RHC, RA/PCWP = 0.75.  Milrinone gtt was begun 3/2 and he felt much better/more alert.  Milrinone turned down to 0.125 on 3/3 with fall in co-ox.  Milrinone back up to 0.25, co-ox 61% with CVP 22.  3/6 milrinone was increased to 0.375 mcg then up to 0.5 on 3/10 due to ongoing low output.  RHC repeated on 3/10, RA/PWCP better at 0.56 and good CI but filling pressures still high.  Now, creatinine rising and up to 6.6.  On Lasix per renal currently with no effect (basically anuric). He remains volume overloaded with weight coming up and CVP 20 when last checked. He has decided on hospice/comfort care at this point.  - Plan to titrate off milrinone today, then decide whether he will be able to get home with hospice or will need to remain inpatient.   2. AKI: Cardiorenal syndrome with suspected superimposed ATN in setting of low output.  Cardiac output good now by co-ox and CVP remains high.  Creatinine worse today. Minimal urine output.  Patient does not want dialysis, which I think is a reasonable decision. 3. CAD: S/p CABG. Cath in 2013 with patent grafts. No chest pain.  4. Elevated transaminases: Suspect hepatic congestion from  CHF.Abdominal US showed normal liver echotexture. 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  His LFTs have come back to normal.  5. Atrial fibrillation: Went into atrial fibrillation => flutter this admission on milrinone.  - Stopped warfarin given hospice plans.    6. Hyponatremia: Suspect this contributes to his lethargy. 7. Disposition: Plan at this point for hospice/comfort measures.  Stopping milrinone, will determine if he can get home or will remain inpatient.   Marca Ancona  MD 06/16/2014 12:26 PM

## 2014-06-17 DIAGNOSIS — R570 Cardiogenic shock: Secondary | ICD-10-CM

## 2014-06-17 LAB — GLUCOSE, CAPILLARY
GLUCOSE-CAPILLARY: 89 mg/dL (ref 70–99)
GLUCOSE-CAPILLARY: 63 mg/dL — AB (ref 70–99)
GLUCOSE-CAPILLARY: 79 mg/dL (ref 70–99)
GLUCOSE-CAPILLARY: 90 mg/dL (ref 70–99)
GLUCOSE-CAPILLARY: 97 mg/dL (ref 70–99)
Glucose-Capillary: 91 mg/dL (ref 70–99)

## 2014-06-17 MED ORDER — ATROPINE SULFATE 1 % OP SOLN
2.0000 [drp] | Freq: Four times a day (QID) | OPHTHALMIC | Status: DC
  Administered 2014-06-18: 2 [drp] via SUBLINGUAL
  Filled 2014-06-17: qty 2

## 2014-06-17 MED ORDER — SODIUM CHLORIDE 0.9 % IV SOLN
4.0000 mg/h | INTRAVENOUS | Status: DC
  Administered 2014-06-17: 1 mg/h via INTRAVENOUS
  Filled 2014-06-17: qty 10

## 2014-06-17 MED ORDER — SODIUM CHLORIDE 0.9 % IV SOLN
2.0000 mg/h | INTRAVENOUS | Status: DC
  Administered 2014-06-17: 0.5 mg/h via INTRAVENOUS
  Filled 2014-06-17: qty 10

## 2014-06-17 NOTE — Progress Notes (Signed)
Patient ID: Luke Pittman, male   DOB: 07-19-1945, 69 y.o.   MRN: [id number]   SUBJECTIVE:   Off inotropes. Made comfort care yesterday. Was able to ambulated to bathroom this am with much difficulty and assistance. Now very lethargic. Not responding to voice or mild prodding.    Scheduled Meds: . acetic acid-hydrocortisone  4 drop Right Ear QHS  . amiodarone  400 mg Oral BID  . aspirin  81 mg Oral Daily  . darbepoetin (ARANESP) injection - NON-DIALYSIS  100 mcg Subcutaneous Q Tue-1800  . ezetimibe  10 mg Oral Daily  . fentaNYL  12.5 mcg Intravenous Once  . fluticasone  2 spray Each Nare QHS  . furosemide  80 mg Intravenous Q6H  . insulin aspart  0-15 Units Subcutaneous TID WC  . insulin aspart  0-5 Units Subcutaneous QHS  . insulin detemir  10 Units Subcutaneous QHS  . lactulose  20 g Oral Daily  . Mega Benfotiamine Antioxidant Capsule  1 capsule Oral Daily  . nateglinide  60 mg Oral TID WC  . ondansetron (ZOFRAN) IV  8 mg Intravenous 4 times per day  . pantoprazole  40 mg Oral Q1200  . pravastatin  80 mg Oral q1800  . prochlorperazine  10 mg Intravenous Q6H  . silver sulfADIAZINE   Topical BID  . sodium chloride  3 mL Intravenous Q12H   Continuous Infusions:   PRN Meds:.sodium chloride, acetaminophen, fentaNYL, HYDROcodone-acetaminophen, MUSCLE RUB, promethazine, sodium chloride, sodium chloride   Filed Vitals:   06/17/14 0418 06/17/14 0729 06/17/14 0800 06/17/14 0900  BP: 84/26 80/28    Pulse: 87 84 73 77  Temp:  98.2 F (36.8 C)    TempSrc:  Axillary    Resp: 20 18    Height:      Weight:      SpO2: 96% 98% 93% 96%    Intake/Output Summary (Last 24 hours) at 06/17/14 1123 Last data filed at 06/17/14 0913  Gross per 24 hour  Intake    490 ml  Output      0 ml  Net    490 ml    LABS: Basic Metabolic Panel:  Recent Labs  40/98/11 0337 06/16/14 0428  NA 116* 117*  K 4.6  --   CL 80*  --   CO2 22  --   GLUCOSE 193*  --   BUN 94*  --   CREATININE 6.60*   --   CALCIUM 8.4  --   PHOS 5.2*  --    Liver Function Tests:  Recent Labs  06/15/14 0337  AST 23  ALT 19  ALKPHOS 86  BILITOT 1.6*  PROT 6.1  ALBUMIN 3.2*   No results for input(s): LIPASE, AMYLASE in the last 72 hours. CBC:  Recent Labs  06/15/14 0337 06/16/14 0428  WBC 7.1 6.8  HGB 8.0* 8.1*  HCT 23.7* 23.3*  MCV 70.3* 68.9*  PLT 119* 123*   Cardiac Enzymes: No results for input(s): CKTOTAL, CKMB, CKMBINDEX, TROPONINI in the last 72 hours. BNP: Invalid input(s): POCBNP D-Dimer: No results for input(s): DDIMER in the last 72 hours. Hemoglobin A1C: No results for input(s): HGBA1C in the last 72 hours. Fasting Lipid Panel: No results for input(s): CHOL, HDL, LDLCALC, TRIG, CHOLHDL, LDLDIRECT in the last 72 hours. Thyroid Function Tests: No results for input(s): TSH, T4TOTAL, T3FREE, THYROIDAB in the last 72 hours.  Invalid input(s): FREET3 Anemia Panel: No results for input(s): VITAMINB12, FOLATE, FERRITIN, TIBC, IRON, RETICCTPCT in the  last 72 hours.  RADIOLOGY: Ct Abdomen Pelvis Wo Contrast  06/05/2014   CLINICAL DATA:  69 year old with cardiomyopathy. Preop evaluation prior to placement of left ventricular assist device.  EXAM: CT ABDOMEN AND PELVIS WITHOUT CONTRAST  TECHNIQUE: Multidetector CT imaging of the abdomen and pelvis was performed following the standard protocol without IV contrast.  COMPARISON:  Chest CT 06/04/2014  FINDINGS: Again noted is a small left pleural effusion. There is cardiomegaly without significant pericardial fluid. No evidence for free intraperitoneal air.  Unenhanced CT was performed per clinician order. Lack of IV contrast limits sensitivity and specificity, especially for evaluation of abdominal/pelvic solid viscera.  There is subcutaneous edema. There is a moderate amount of ascites around the liver and within the pelvis. Small amount of ascites around the spleen. No gross abnormality to the liver, gallbladder or spleen. Spleen size  is normal. Normal appearance of the pancreas, stomach and adrenal glands. Normal appearance of the kidneys, urinary bladder and prostate.  Atherosclerotic calcifications in the abdominal aorta without aneurysm. No significant lymphadenopathy involving the abdomen or pelvis. Normal appearance of the small bowel, large bowel and appendix. Mild disc space loss at L5-S1. Mild facet arthropathy in the lower lumbar spine.  IMPRESSION: Small-to-moderate amount of ascites. Subcutaneous edema and a small left pleural effusion.   Electronically Signed   By: Richarda Overlie M.D.   On: 06/05/2014 12:42   Dg Orthopantogram  06/05/2014   CLINICAL DATA:  Preoperative evaluation  EXAM: ORTHOPANTOGRAM/PANORAMIC  COMPARISON:  None  FINDINGS: Dental appliances present.  Multiple prior dental RIGHT extractions with dental amalgam at multiple remaining teeth.  No mandibular fracture or bone destruction identified.  IMPRESSION: No acute mandibular abnormalities.   Electronically Signed   By: Ulyses Southward M.D.   On: 06/05/2014 12:10   Dg Chest 2 View  05/28/2014   CLINICAL DATA:  Congestive failure  EXAM: CHEST  2 VIEW  COMPARISON:  10/03/2013  FINDINGS: Cardiac shadow remains enlarged. Postsurgical changes are again seen. No vascular congestion is seen. Very minimal effusions are noted bilaterally. No focal infiltrate is noted. No bony abnormality is seen.  IMPRESSION: Small effusions.  No other significant abnormality is noted.   Electronically Signed   By: Alcide Clever M.D.   On: 05/12/2014 13:44   Ct Chest Wo Contrast  06/04/2014   CLINICAL DATA:  Shortness of breath, history of heart failure  EXAM: CT CHEST WITHOUT CONTRAST  TECHNIQUE: Multidetector CT imaging of the chest was performed following the standard protocol without IV contrast.  COMPARISON:  05/31/2014  FINDINGS: Mild peribronchial wall thickening. Mild interstitial change with a few Kerley B-lines bilaterally. No alveolar opacification. Small bilateral pleural effusions  larger on the left. No significant pericardial effusion.  Severe cardiac enlargement and coronary artery calcification. Mild thoracic aortic calcification.  Right PICC line tip extends to the cavoatrial junction.  Images through the upper abdomen show a small volume of ascites.  No acute musculoskeletal findings. No significant hilar or mediastinal adenopathy.  IMPRESSION: Findings consistent with congestive heart failure with mild interstitial pulmonary edema.   Electronically Signed   By: Esperanza Heir M.D.   On: 06/04/2014 16:08   US Abdomen Complete  05/24/2014   CLINICAL DATA:  Increased liver function test. History of diabetes, chronic kidney disease and patient is taking Cymbalta  EXAM: ULTRASOUND ABDOMEN COMPLETE  COMPARISON:  None.  FINDINGS: Gallbladder: Cholelithiasis without pericholecystic fluid. Gallbladder wall wall thickening measuring 5.6 mm. Negative sonographic Murphy sign.  Common bile duct:  Diameter: 2.5 mm  Liver: No focal lesion identified. Within normal limits in parenchymal echogenicity.  IVC: No abnormality visualized.  Pancreas: Limited visualization secondary to overlying bowel gas.  Spleen: Size and appearance within normal limits.  Right Kidney: Length: 11.5 cm. Echogenicity within normal limits. No mass or hydronephrosis visualized.  Left Kidney: Length: 11.6 cm. Echogenicity within normal limits. No mass or hydronephrosis visualized.  Abdominal aorta: No aneurysm visualized.  Other findings: There is a small amount of ascites. There are bilateral pleural effusions.  IMPRESSION: 1. Cholelithiasis. Gallbladder wall thickening which may be secondary to intrinsic gallbladder wall abnormality suggest cholecystitis, but can also be seen in the setting of ascites and hepatocellular disease versus hypoalbuminemia versus fluid overload. The lateral etiologies are favored over cholecystitis. 2. Small amount of ascites. 3. Bilateral pleural effusions.   Electronically Signed   By: Elige KoHetal   Patel   On: 05/04/2014 14:56   [place] Renal  06/13/2014   CLINICAL DATA:  Acute kidney injury.  EXAM: RENAL/URINARY TRACT ULTRASOUND COMPLETE  COMPARISON:  06/05/2014 and on ultrasound 05/19/2014  FINDINGS: Right Kidney:  Length: 13.7 cm. Echogenicity within normal limits. No mass or hydronephrosis visualized.  Left Kidney:  Length: 13.6 cm. Echogenicity within normal limits. No mass or hydronephrosis visualized.  Bladder:  Small amount of fluid in the bladder.  Small amount of fluid around the spleen. Ascites in the right lower quadrant.  IMPRESSION: Normal appearance of both kidneys without hydronephrosis.  Ascites.   Electronically Signed   By: Richarda OverlieAdam  Henn M.D.   On: 06/13/2014 22:40   Dg Chest Port 1 View  05/31/2014   CLINICAL DATA:  PICC line placement  EXAM: PORTABLE CHEST - 1 VIEW  COMPARISON:  05/15/2014  FINDINGS: Right-sided PICC line with the tip projecting over the cavoatrial junction. No focal consolidation or pneumothorax. Trace left pleural effusion. There is stable cardiomegaly. There is evidence of prior CABG.  Mild osteoarthritis of bilateral glenohumeral joints.  IMPRESSION: Right-sided PICC line with the tip projecting over the cavoatrial junction.   Electronically Signed   By: Elige KoHetal  Patel   On: 05/31/2014 16:43    PHYSICAL EXAM  General: Lying in bed. Terminally ill appearing + mild gurgling Neck: JVP up no thyromegaly or thyroid nodule.  Lungs: +crackles with decreased effort CV: Nondisplaced PMI.  Heart mildly tachy, regular S1/S2, no S3/S4, no murmur.  2+ edema 1/2 up lower legs bilaterally.  Abdomen: Soft, nontender, no hepatosplenomegaly, mildly distended  Psych: extremely lethargic.  Extremities: No clubbing or cyanosis.      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  1. Acute on chronic systolic CHF: EF 40%20% with at least moderate RV dysfunction by echo.Failed inotrope support. 2. AKI: Cardiorenal syndrome  with suspected superimposed ATN in setting of low output.  3. CAD: S/p CABG. Cath in 2013 with patent grafts. No chest pain.  4. Atrial fibrillation:  5. Hyponatremia:   He is terminally ill. I am worried he will begin to have respiratory distress. Discussed situation with family. Will start morphine and versed drips with atropine drops for comfort care. They are aware that he will likely pass today or tomorrow. Stop all non-essential meds. Dr. Shirlee LatchMclean updated.   Abbegayle Denault,MD 11:58 AM

## 2014-06-17 NOTE — Progress Notes (Signed)
Hypoglycemic Event  CBG: 63  Treatment: 15 GM carbohydrate snack  Symptoms: None  Follow-up CBG: Time:2219 CBG Result:79  Possible Reasons for Event: Inadequate meal intake  Comments/MD notified:MD Crane notified at 2230. MD Duke Salviaandolph ordered to hold 2200 Levemir dose.     Luke Pittman, [place] N  Remember to initiate Hypoglycemia Order Set & complete

## 2014-06-17 NOTE — Progress Notes (Signed)
Called MD Duke Salviaandolph about pt's 2200 Levemir 10 units. Informed MD that Pt's BG at 2130 was 63. Pt asymptomatic with BG.  Also informed MD that pt has not eaten all day. MD ordered RN to not give 2200 Levemir dose. Will continue to monitor pt.

## 2014-06-27 ENCOUNTER — Telehealth: Payer: Self-pay

## 2014-06-27 NOTE — Telephone Encounter (Signed)
Original d/c received on this pt I spoke with Angie last week the doc on the d/c their asking to sign is Dr. Glori Luisaniel Friedman  I'm Not familiar with this physician per angie she asked me to send this [facility] to  [facility]  [address]  [place] [phone] Placed in mail [phone]/km

## 2014-06-30 NOTE — Progress Notes (Signed)
Time of death [id number]. Two RNs (Megan Fredric MareBailey and WellmanShanna Conley Pawling) auscultated no heart beat/pulse for 2 minutes. No respirations noted for 2 minutes. Pt's wife at bedside. Pts wife denied chaplain service. Notified MD Zachery ConchFriedman of time of death at [id number].

## 2014-06-30 NOTE — Progress Notes (Signed)
WIS 30 ml versed and 230 ml morphine with Luke Pittman.

## 2014-06-30 DEATH — deceased

## 2014-07-03 NOTE — Discharge Summary (Signed)
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 He had severe biventricular failure with low output.  He was started and milrinone and diuresed.  Despite milrinone support, he developed progressive renal failure and volume overload.  He was not a candidate for advanced therapies (LVAD or transplant) due to worsening renal function.  He was a poor dialysis candidate.  Despite our maximal efforts, he passed away in the hospital under comfort care.

## 2014-09-07 ENCOUNTER — Ambulatory Visit: Payer: Commercial Managed Care - HMO | Admitting: Family Medicine

## 2016-05-27 IMAGING — DX DG CHEST 2V
2 series · 2 of 2 positions shown · non-contrast
Comparison: 10/03/2013

CLINICAL DATA: Congestive failure

EXAM:
CHEST  2 VIEW

[chest pa]
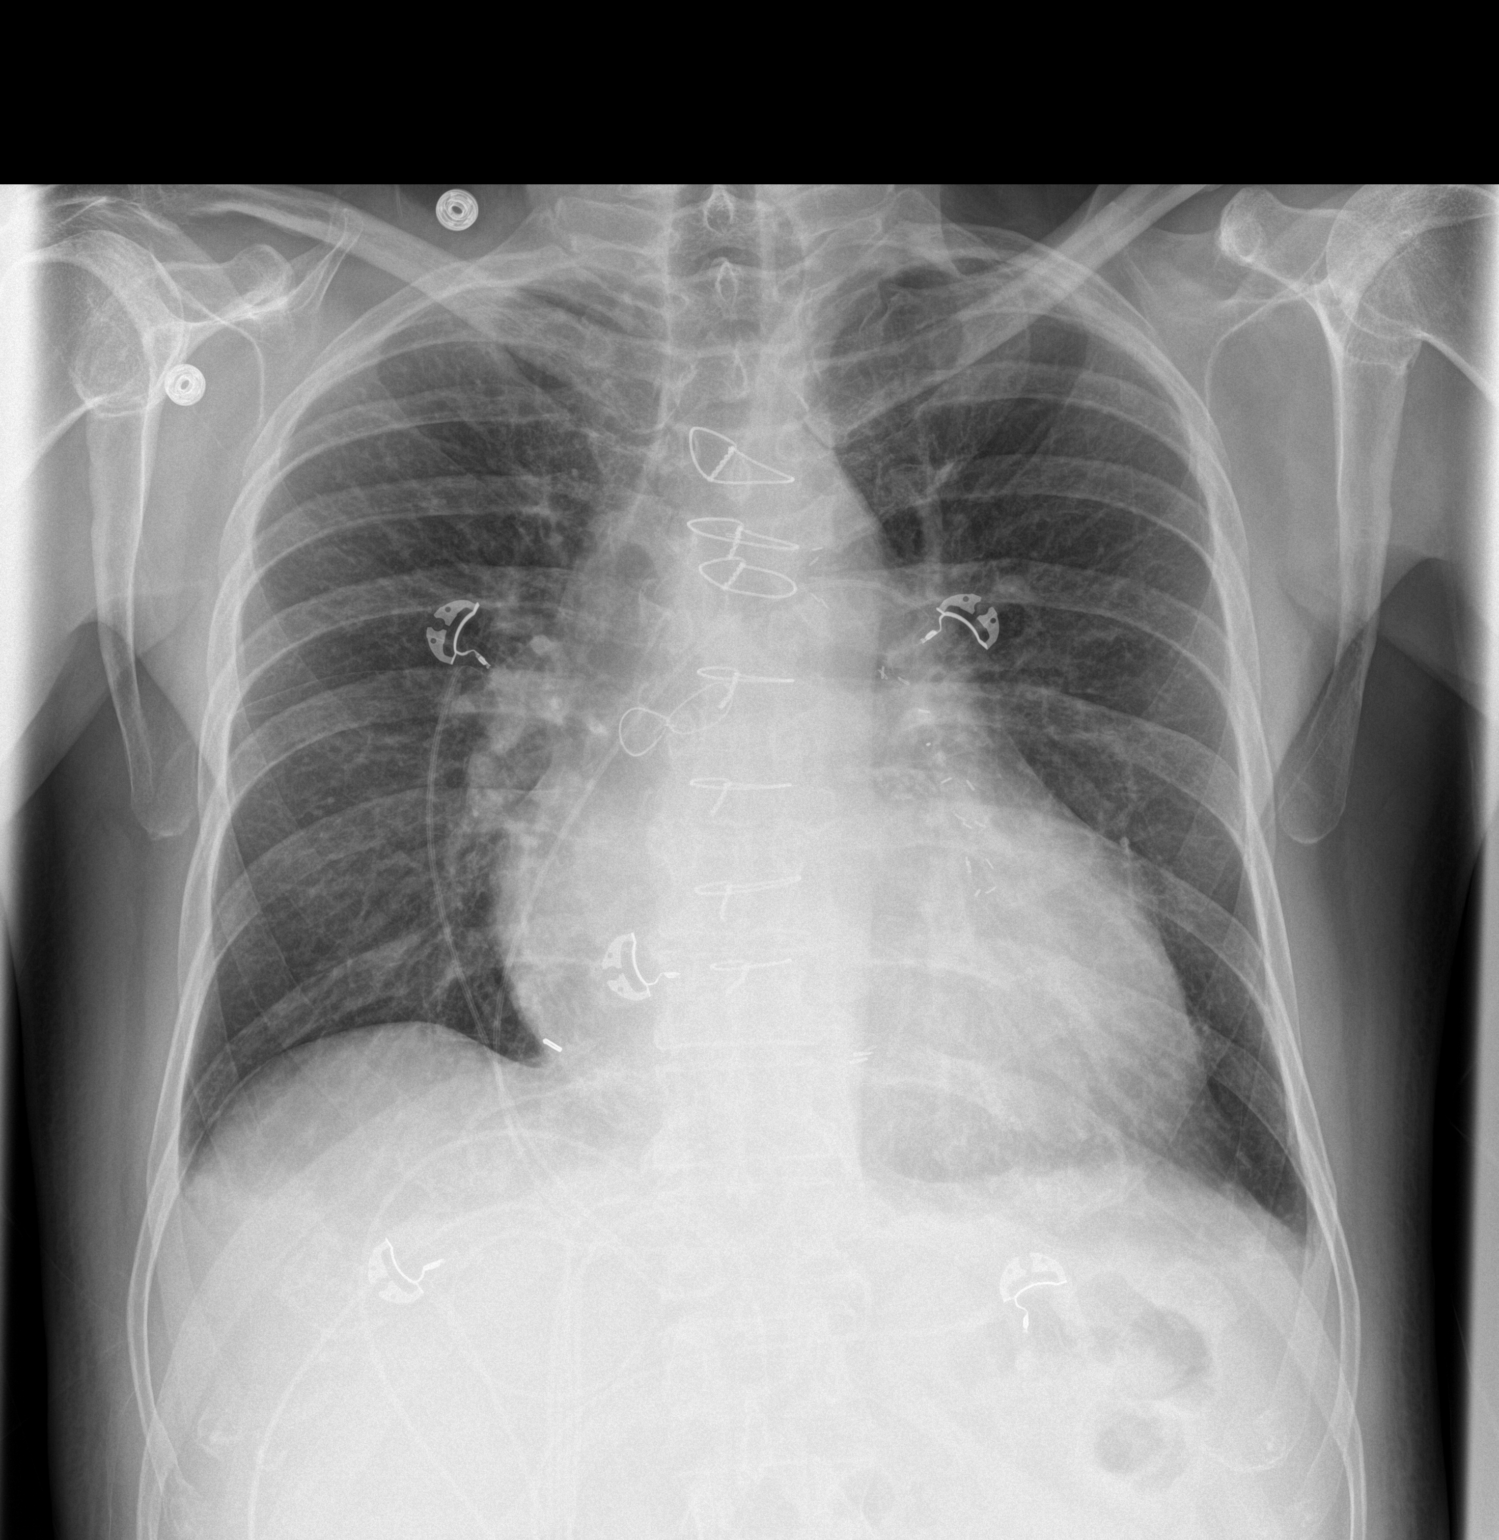

[chest lat]
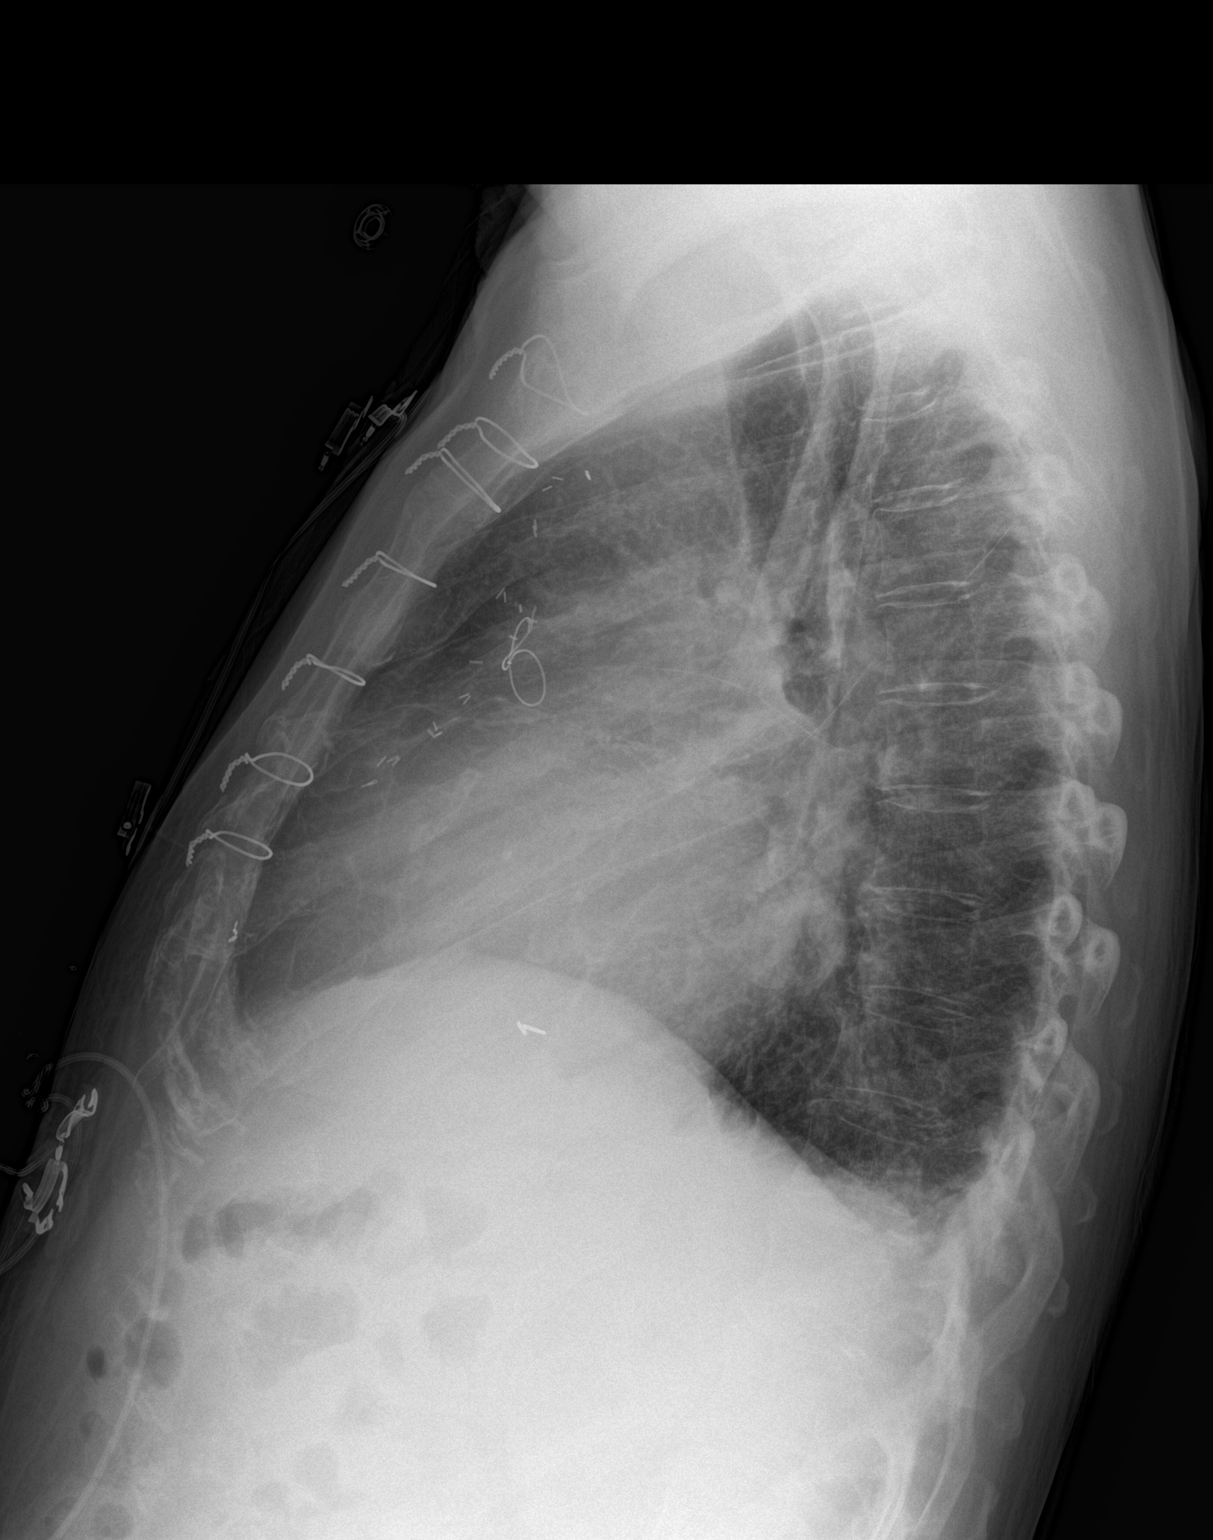

[2 of 2 positions shown; findings below may reference images not displayed]

FINDINGS: Cardiac shadow remains enlarged. Postsurgical changes are again
seen. No vascular congestion is seen. Very minimal effusions are
noted bilaterally. No focal infiltrate is noted. No bony abnormality
is seen.
IMPRESSION: Small effusions.  No other significant abnormality is noted.

## 2016-06-02 IMAGING — CT CT CHEST W/O CM
2 of 4 series · 13 of 36 positions shown, 16 images · non-contrast
Comparison: 05/31/2014

CLINICAL DATA: Shortness of breath, history of heart failure

EXAM:
CT CHEST WITHOUT CONTRAST
TECHNIQUE: Multidetector CT imaging of the chest was performed following the
standard protocol without IV contrast..

[Series 201: chest without, idose (2) · axial · non-contrast · 0.68mm/px · z∈[+124,+409]mm · 10 of 67 slices shown, 13 images]
[im 5/67  mediastinal]
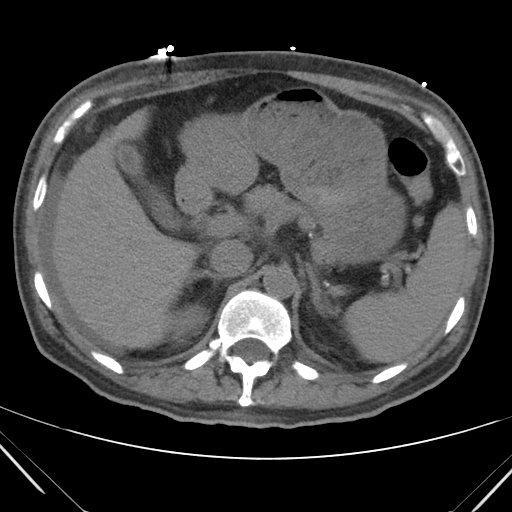
[im 5/67  lung]
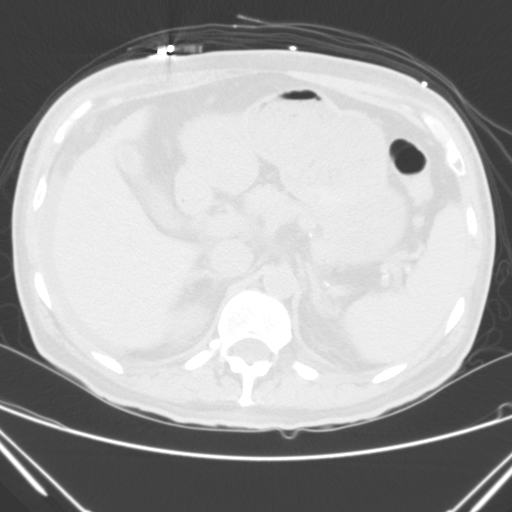
[im 10/67  lung]
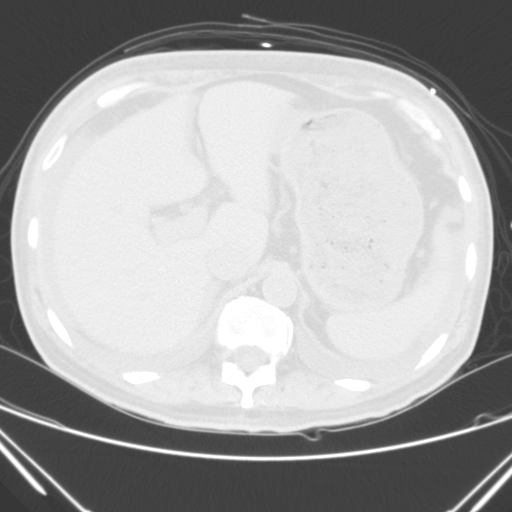
[im 19/67  lung]
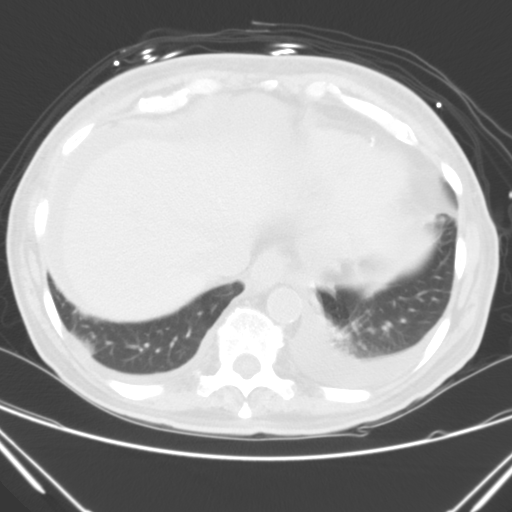
[im 24/67  lung]
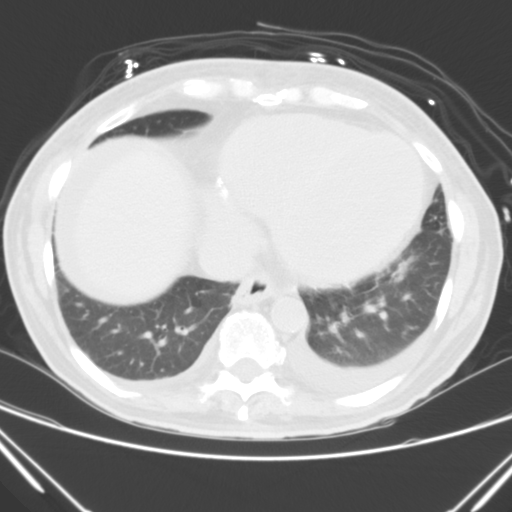
[im 29/67  mediastinal]
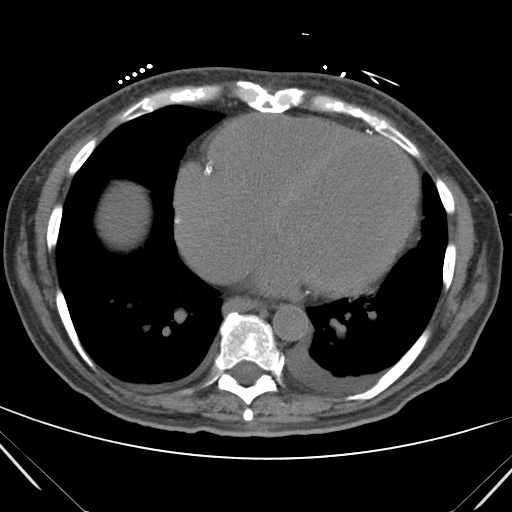
[im 29/67  lung]
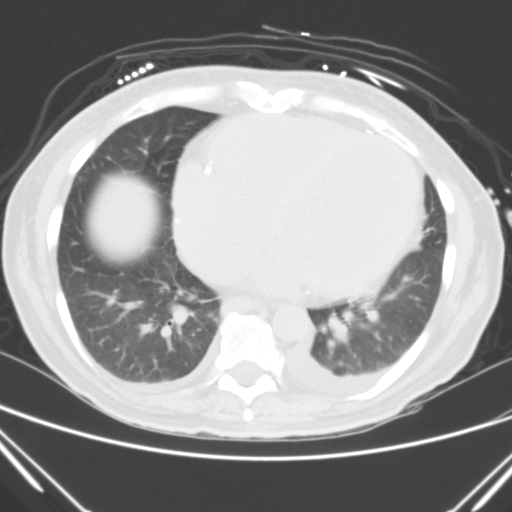
[im 38/67  lung]
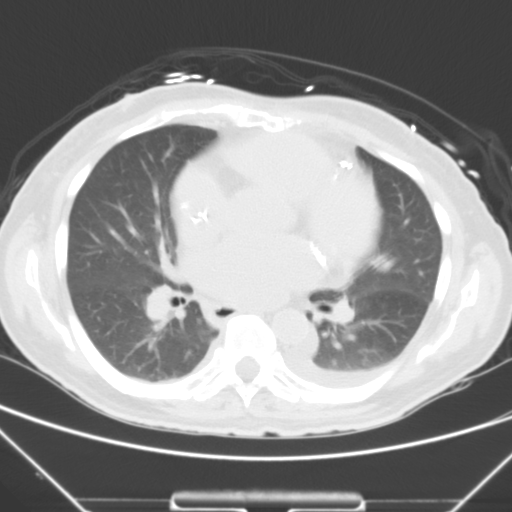
[im 43/67  lung]
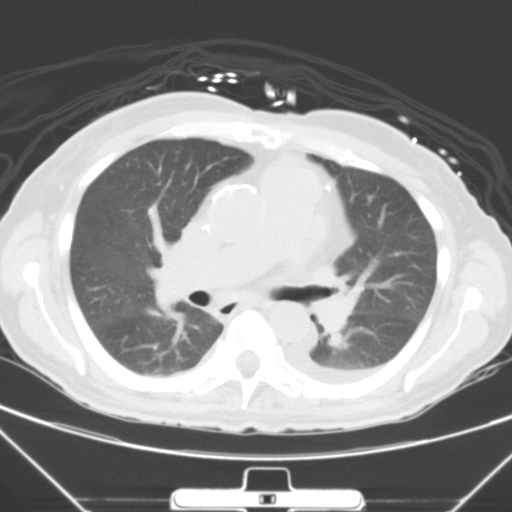
[im 48/67  lung]
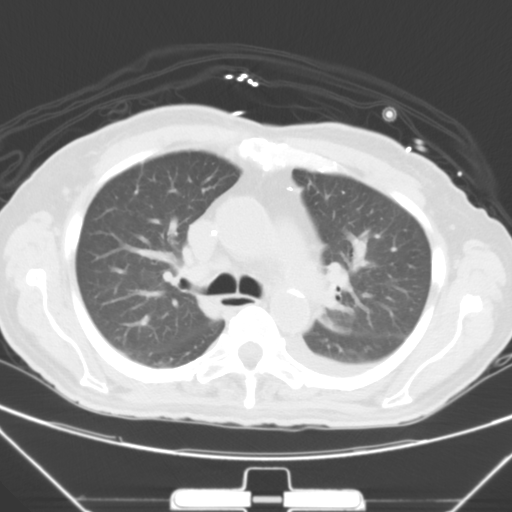
[im 57/67  mediastinal]
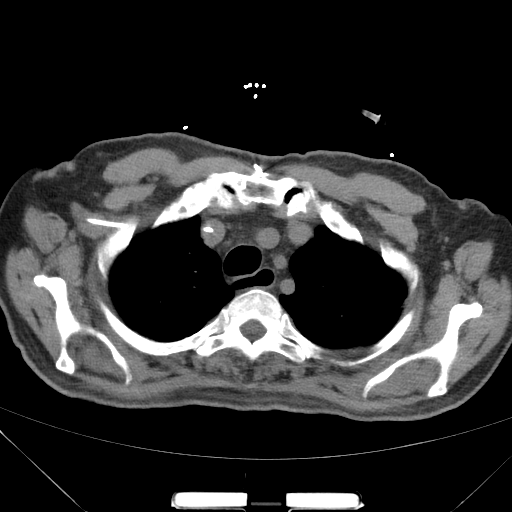
[im 57/67  lung]
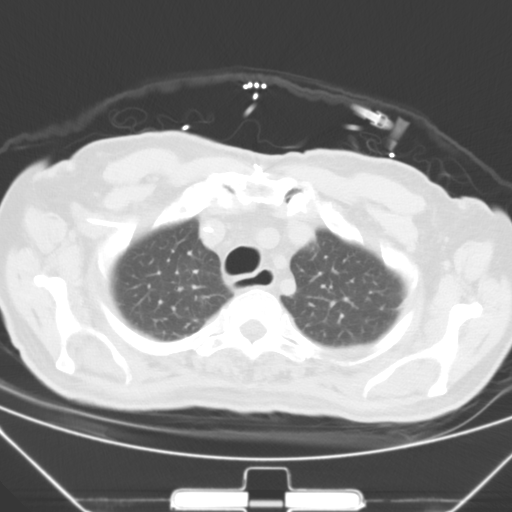
[im 62/67  lung]
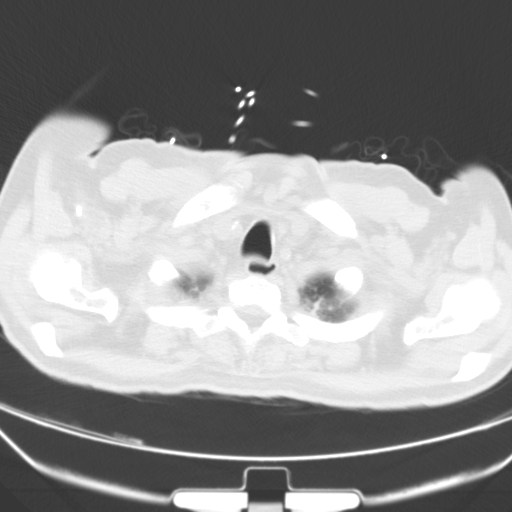

[Series 202: coronal, idose (2) · coronal · 0.45mm/px · 3 of 99 slices shown]
[im 20/99  lung]
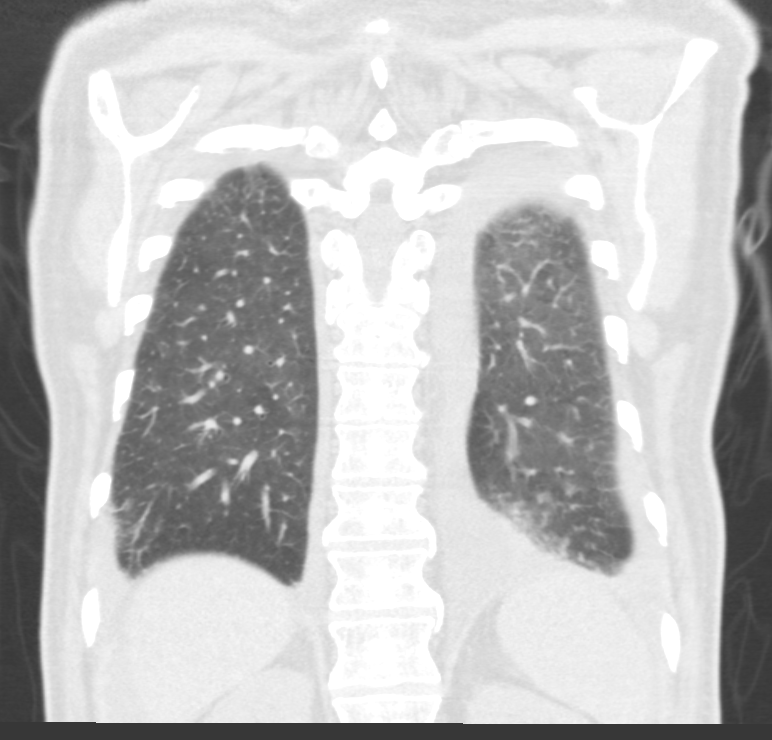
[im 40/99  lung]
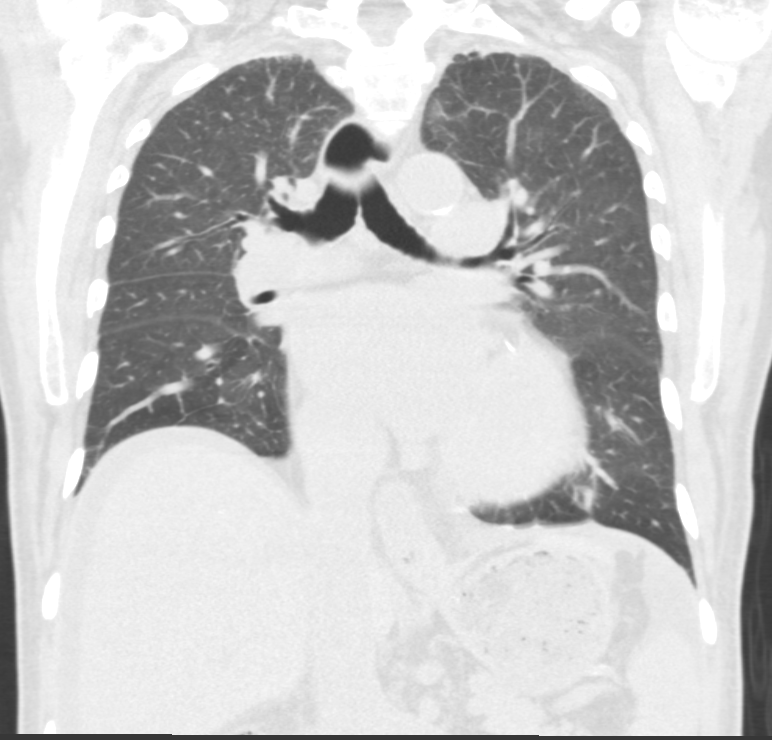
[im 59/99  lung]
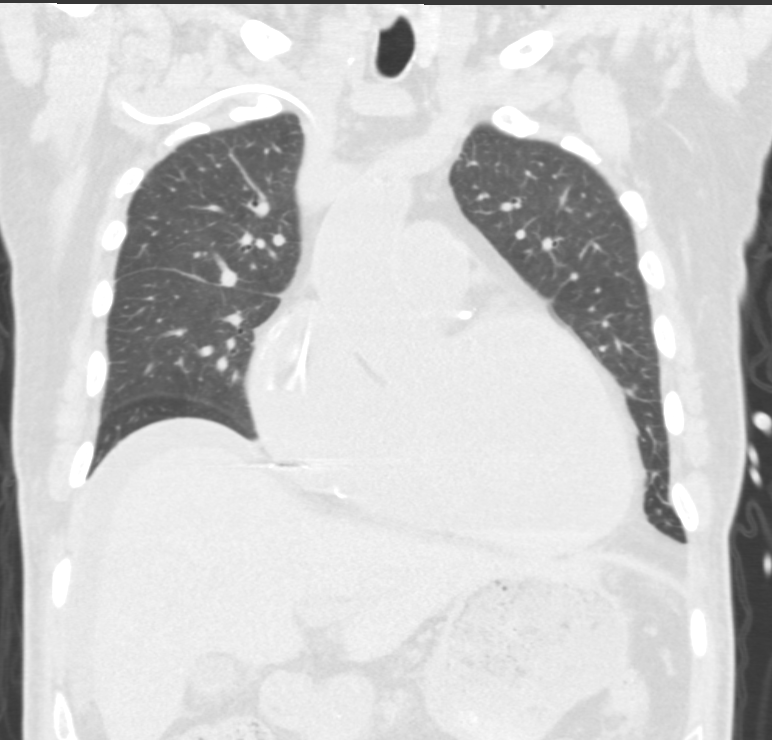

[13 of 36 positions shown; findings below may reference images not displayed]

FINDINGS: Mild peribronchial wall thickening. Mild interstitial change with a
few Kerley B-lines bilaterally. No alveolar opacification. Small
bilateral pleural effusions larger on the left. No significant
pericardial effusion.

Severe cardiac enlargement and coronary artery calcification. Mild
thoracic aortic calcification.

Right PICC line tip extends to the cavoatrial junction.

Images through the upper abdomen show a small volume of ascites.

No acute musculoskeletal findings. No significant hilar or
mediastinal adenopathy.
IMPRESSION: Findings consistent with congestive heart failure with mild
interstitial pulmonary edema.
